# Patient Record
Sex: Female | Born: 1955 | Race: White | Hispanic: No | Smoking: Current every day smoker
Health system: Southern US, Community
[De-identification: ages and names within clinical notes are randomized; demographics above are authoritative.]

## PROBLEM LIST (undated history)

## (undated) DIAGNOSIS — F419 Anxiety disorder, unspecified: Secondary | ICD-10-CM

## (undated) DIAGNOSIS — I509 Heart failure, unspecified: Secondary | ICD-10-CM

## (undated) DIAGNOSIS — M797 Fibromyalgia: Secondary | ICD-10-CM

## (undated) DIAGNOSIS — F32A Depression, unspecified: Secondary | ICD-10-CM

## (undated) DIAGNOSIS — I10 Essential (primary) hypertension: Secondary | ICD-10-CM

## (undated) DIAGNOSIS — N189 Chronic kidney disease, unspecified: Secondary | ICD-10-CM

## (undated) HISTORY — DX: Heart failure, unspecified: I50.9

## (undated) HISTORY — DX: Depression, unspecified: F32.A

## (undated) HISTORY — DX: Anxiety disorder, unspecified: F41.9

## (undated) HISTORY — PX: CERVICAL FUSION: SHX112

---

## 2004-10-12 ENCOUNTER — Encounter: Admission: RE | Admit: 2004-10-12 | Discharge: 2004-10-12 | Payer: Self-pay | Admitting: Gastroenterology

## 2005-06-21 ENCOUNTER — Emergency Department (HOSPITAL_COMMUNITY): Admission: EM | Admit: 2005-06-21 | Discharge: 2005-06-21 | Payer: Self-pay | Admitting: Emergency Medicine

## 2005-06-30 ENCOUNTER — Encounter: Admission: RE | Admit: 2005-06-30 | Discharge: 2005-06-30 | Payer: Self-pay | Admitting: Internal Medicine

## 2005-07-30 ENCOUNTER — Ambulatory Visit (HOSPITAL_COMMUNITY): Admission: RE | Admit: 2005-07-30 | Discharge: 2005-07-31 | Payer: Self-pay | Admitting: Neurosurgery

## 2005-08-24 ENCOUNTER — Encounter: Admission: RE | Admit: 2005-08-24 | Discharge: 2005-08-24 | Payer: Self-pay | Admitting: Neurosurgery

## 2005-08-25 ENCOUNTER — Encounter: Admission: RE | Admit: 2005-08-25 | Discharge: 2005-08-25 | Payer: Self-pay | Admitting: Neurosurgery

## 2005-09-06 ENCOUNTER — Encounter: Admission: RE | Admit: 2005-09-06 | Discharge: 2005-09-06 | Payer: Self-pay | Admitting: Neurosurgery

## 2005-09-20 ENCOUNTER — Ambulatory Visit (HOSPITAL_COMMUNITY): Admission: RE | Admit: 2005-09-20 | Discharge: 2005-09-21 | Payer: Self-pay | Admitting: Neurosurgery

## 2005-10-09 ENCOUNTER — Emergency Department (HOSPITAL_COMMUNITY): Admission: EM | Admit: 2005-10-09 | Discharge: 2005-10-09 | Payer: Self-pay | Admitting: Emergency Medicine

## 2005-11-09 ENCOUNTER — Encounter: Admission: RE | Admit: 2005-11-09 | Discharge: 2005-11-09 | Payer: Self-pay | Admitting: Neurosurgery

## 2006-03-08 ENCOUNTER — Encounter: Admission: RE | Admit: 2006-03-08 | Discharge: 2006-03-08 | Payer: Self-pay | Admitting: Neurosurgery

## 2006-05-31 ENCOUNTER — Encounter: Admission: RE | Admit: 2006-05-31 | Discharge: 2006-05-31 | Payer: Self-pay | Admitting: Neurosurgery

## 2006-06-24 ENCOUNTER — Emergency Department (HOSPITAL_COMMUNITY): Admission: EM | Admit: 2006-06-24 | Discharge: 2006-06-24 | Payer: Self-pay | Admitting: Emergency Medicine

## 2006-08-30 ENCOUNTER — Encounter: Admission: RE | Admit: 2006-08-30 | Discharge: 2006-08-30 | Payer: Self-pay | Admitting: Neurosurgery

## 2006-09-09 ENCOUNTER — Ambulatory Visit (HOSPITAL_COMMUNITY): Admission: RE | Admit: 2006-09-09 | Discharge: 2006-09-09 | Payer: Self-pay | Admitting: Neurosurgery

## 2010-02-15 ENCOUNTER — Emergency Department: Payer: Self-pay | Admitting: Internal Medicine

## 2019-04-23 ENCOUNTER — Emergency Department (HOSPITAL_COMMUNITY)
Admission: EM | Admit: 2019-04-23 | Discharge: 2019-04-23 | Disposition: A | Discharge status: Home / Self Care | Payer: Medicare Other | Attending: Emergency Medicine | Admitting: Emergency Medicine

## 2019-04-23 ENCOUNTER — Other Ambulatory Visit: Payer: Self-pay

## 2019-04-23 ENCOUNTER — Emergency Department (HOSPITAL_COMMUNITY): Payer: Medicare Other

## 2019-04-23 ENCOUNTER — Encounter (HOSPITAL_COMMUNITY): Payer: Self-pay | Admitting: Emergency Medicine

## 2019-04-23 DIAGNOSIS — Y999 Unspecified external cause status: Secondary | ICD-10-CM | POA: Insufficient documentation

## 2019-04-23 DIAGNOSIS — F22 Delusional disorders: Secondary | ICD-10-CM | POA: Insufficient documentation

## 2019-04-23 DIAGNOSIS — X58XXXA Exposure to other specified factors, initial encounter: Secondary | ICD-10-CM | POA: Diagnosis not present

## 2019-04-23 DIAGNOSIS — T171XXA Foreign body in nostril, initial encounter: Secondary | ICD-10-CM | POA: Diagnosis present

## 2019-04-23 DIAGNOSIS — Y9389 Activity, other specified: Secondary | ICD-10-CM | POA: Insufficient documentation

## 2019-04-23 DIAGNOSIS — J3489 Other specified disorders of nose and nasal sinuses: Secondary | ICD-10-CM | POA: Insufficient documentation

## 2019-04-23 DIAGNOSIS — Y92009 Unspecified place in unspecified non-institutional (private) residence as the place of occurrence of the external cause: Secondary | ICD-10-CM | POA: Diagnosis not present

## 2019-04-23 DIAGNOSIS — I10 Essential (primary) hypertension: Secondary | ICD-10-CM | POA: Diagnosis not present

## 2019-04-23 HISTORY — DX: Fibromyalgia: M79.7

## 2019-04-23 HISTORY — DX: Essential (primary) hypertension: I10

## 2019-04-23 NOTE — ED Notes (Signed)
Pt got upset because she states there is string in her nose and she will go to [facility] and if there is string in her nose she is suing this hospital.

## 2019-04-23 NOTE — ED Provider Notes (Signed)
[facility]-EMERGENCY DEPT Provider Note   CSN: [id number] Arrival date & time: 04/23/19  1716    History   Chief Complaint Chief Complaint  Patient presents with  . wire in nose    HPI Emma Gould is a 63 y.o. female.     HPI Patient reports that 2 days ago she was using a Q-tip to put some Vaseline on her nose because the inside of her nose felt raw and sore.  She reports that she dropped a Q-tip on the floor and did not look at it before putting it back into her nose and inadvertently stuck a piece of wire into her nose.  She reports that now it has been stuck in there for 2 days.  She reports she perceives it to be coiled up and to also have passed over to the other side as well.  She reports that she has been pulling it out but pieces of it continue to be stuck in her nose.  She reports that it has tissue or drainage that has "film over it" that I will need to pull away in order to see it.  Patient reports she is otherwise felt very well.  She denies she has been having any fevers or chills.  Denies sinus congestion or nasal drainage or sore throat.  She denies confusion or visual problems.  Patient ports she lives alone.  Her husband died approximately a year ago.  She denies that she has been experiencing any significant anxiety or stress from recent social quarantining isolation.  She reports she has 2 adult children who live in the area. Past Medical History:  Diagnosis Date  . Fibromyalgia   . Hypertension     There are no active problems to display for this patient.   History reviewed. No pertinent surgical history.   OB History   No obstetric history on file.      Home Medications    Prior to Admission medications   Not on File    Family History No family history on file.  Social History Social History   Tobacco Use  . Smoking status: Not on file  Substance Use Topics  . Alcohol use: Not on file  . Drug use: Not on file      Allergies   Patient has no allergy information on record.   Review of Systems Review of Systems 10 Systems reviewed and are negative for acute change except as noted in the HPI.   Physical Exam Updated Vital Signs BP (!) 180/82   Pulse 68   Temp 98 F (36.7 C) (Oral)   Resp 16   SpO2 95%   Physical Exam Constitutional:      Comments: Patient is alert and has a clinically well appearance.  She is physically well-nourished and well-developed.  Patient is well-groomed.  No respiratory distress.  She is interactive with good eye contact.  HENT:     Head: Normocephalic and atraumatic.     Comments: Right ear canal has greater 75% cerumen impaction.  A sliver of the TM is visible and normal in appearance.  Left ear canal is clear with normal TM.    Nose:     Comments: External visual appearance of the nose is normal without any undue swelling or deformity.  Extensive internal visual inspection shows  no visible foreign bodies.  There is no appearance of acute trauma.  The inner aspect of the alae on the right does appear somewhat thickened  and lichenified as if frequent rubbing or picking.  However, there is no moist wound or ulceration.  No blood clots in the nose.  grossly speaking, passages are clear without any undue bogginess or fullness of the turbinates.    Mouth/Throat:     Comments: Patient has dentures.  Airway is widely patent.  Tongue has some diffuse glossitis. Eyes:     Extraocular Movements: Extraocular movements intact.     Conjunctiva/sclera: Conjunctivae normal.     Pupils: Pupils are equal, round, and reactive to light.  Neck:     Musculoskeletal: Neck supple.     Comments: Neck is supple without lymphadenopathy or tenderness. Pulmonary:     Breath sounds: Normal breath sounds.  Abdominal:     Palpations: Abdomen is soft.  Musculoskeletal: Normal range of motion.        General: No swelling or tenderness.  Skin:    General: Skin is warm and dry.   Neurological:     General: No focal deficit present.     Mental Status: She is oriented to person, place, and time.     Coordination: Coordination normal.  Psychiatric:     Comments: Mood is pleasant and interactive.  Patient does not appear to be reacting to internal stimuli.  Speech is not pressured or tangential.      ED Treatments / Results  Labs (all labs ordered are listed, but only abnormal results are displayed) Labs Reviewed - No data to display  EKG None  Radiology Dg Nasal Bones  Result Date: 04/23/2019 CLINICAL DATA:  Metallic nasal foreign body. EXAM: NASAL BONES - 3+ VIEW COMPARISON:  None. FINDINGS: Nasal bones are intact. No evidence for dense linear radiopaque nasal foreign body to suggest metallic etiology. IMPRESSION: No linear radiopaque nasal foreign body to suggest wire. Electronically Signed   By: Kennith CenterEric  Mansell M.D.   On: 04/23/2019 18:37    Procedures Procedures (including critical care time)  Medications Ordered in ED Medications - No data to display   Initial Impression / Assessment and Plan / ED Course  I have reviewed the triage vital signs and the nursing notes.  Pertinent labs & imaging results that were available during my care of the patient were reviewed by me and considered in my medical decision making (see chart for details).       Patient is convinced that she has metallic bodies in her nose.  She described this in various terms anywhere from needles sticking out of her nose, to small snaps with hooks, or some type of coated wire.  In every aspect except this fixed delusion regarding metallic bodies in the nose, patient seems well and functioning normally.  There does not appear to be a medication induced psychosis.  Patient denies that she ever started the nicotine treatments that were prescribed at her most recent tele-visit.  She reports she never actually went to the pharmacy to pick them up.  She reports she is still smoking.  She is  denying any drugs of abuse or other new medications.  Physical exam is otherwise normal.  She has mid range normally responsive pupils, no tremor or appearance of neurologic dysfunction.  Vital signs are stable.  Patient is well oriented immediately able to know the date and her address.  She however refused to call family members.  She had advised me she had 2 grown children, she quickly became very hostile at the idea of speaking to them about this and took significant exception to  the notion that this was a psychiatric issue.  She reports that she refused to call her children because it was "dinnertime" and they were probably eating.  Patient however is not showing any other signs of medical illness.  She not showing any signs of decompensation in her ability to care for herself.  She is well-groomed and medially oriented to place person and time.  She does not exhibit any other active delusions.  I did tactfully and gently try offering exploring this from a psychiatric perspective, this made the patient quite hostile and she immediately refused this stating that she would go to another hospital and if they found something in her nose she would sue me.  She is able to fairly easily redirect and de-escalate.  At this time, there is no grounds upon which to pursue involuntary commitment.  Although, the patient would not contact anybody on her cell phone, I did call the one listed contact in her demographic requesting a call back but not identifying the nature of the patient's visit, thus far there has been no response.  Final Clinical Impressions(s) / ED Diagnoses   Final diagnoses:  Nose pain  Delusion [facility])    ED Discharge Orders    None       Arby Barrette, MD 04/23/19 2003

## 2019-04-23 NOTE — Discharge Instructions (Addendum)
1.  There is irritation of the skin inside your nose. Try to avoid any picking or causing trauma to the inside of the nose by trying to remove something. 2.  There are no wires or other metal things in your nose at this time. It may feel like there is due to skin problems. This may need further evaluation by a specialist if it does not resolve in the next few weeks. 3. It is very important that you follow up with your doctor, even with a televisit, as soon as possible. 4. Return immediately to the ER if you feel you are worsening.

## 2019-04-23 NOTE — ED Triage Notes (Signed)
Pt reports that 2 fridays ago she had sore in her nose and was taking Vaseline on q-tip and rubbing inside of nose with it. Reports that she dropped it on the ground and didn't look at it before she put it back in her nose. Reports that she got wire in her nose "and moved to both sides and is very painful and now has a film over it".

## 2019-04-27 ENCOUNTER — Encounter (HOSPITAL_COMMUNITY): Payer: Self-pay | Admitting: Emergency Medicine

## 2019-04-27 ENCOUNTER — Other Ambulatory Visit: Payer: Self-pay

## 2019-04-27 DIAGNOSIS — X58XXXA Exposure to other specified factors, initial encounter: Secondary | ICD-10-CM | POA: Diagnosis not present

## 2019-04-27 DIAGNOSIS — I10 Essential (primary) hypertension: Secondary | ICD-10-CM | POA: Insufficient documentation

## 2019-04-27 DIAGNOSIS — F450 Somatization disorder: Secondary | ICD-10-CM | POA: Insufficient documentation

## 2019-04-27 DIAGNOSIS — Y929 Unspecified place or not applicable: Secondary | ICD-10-CM | POA: Insufficient documentation

## 2019-04-27 DIAGNOSIS — Y999 Unspecified external cause status: Secondary | ICD-10-CM | POA: Insufficient documentation

## 2019-04-27 DIAGNOSIS — Y939 Activity, unspecified: Secondary | ICD-10-CM | POA: Insufficient documentation

## 2019-04-27 DIAGNOSIS — F17210 Nicotine dependence, cigarettes, uncomplicated: Secondary | ICD-10-CM | POA: Insufficient documentation

## 2019-04-27 DIAGNOSIS — T170XXA Foreign body in nasal sinus, initial encounter: Secondary | ICD-10-CM | POA: Diagnosis present

## 2019-04-27 NOTE — ED Triage Notes (Signed)
Pt states she has a foreign body in her nose  A string that moves and she can feel them . Pt seen several days ago for same x-ray was done which was negative for FB

## 2019-04-28 ENCOUNTER — Emergency Department (HOSPITAL_COMMUNITY)
Admission: EM | Admit: 2019-04-28 | Discharge: 2019-04-28 | Disposition: A | Discharge status: Home / Self Care | Payer: Medicare Other | Attending: Emergency Medicine | Admitting: Emergency Medicine

## 2019-04-28 DIAGNOSIS — F450 Somatization disorder: Secondary | ICD-10-CM

## 2019-04-28 NOTE — ED Provider Notes (Signed)
[facility]-EMERGENCY DEPT Provider Note   CSN: [id number] Arrival date & time: 04/27/19  2331    History   Chief Complaint Chief Complaint  Patient presents with  . Medical Clearance  . Foreign Body in Nose    HPI Emma Gould is a 63 y.o. female.   The history is provided by the patient.  She has history of hypertension and fibromyalgia and comes in complaining of string and wire that is caught in her nose for the last 2 weeks.  Symptoms started when she was using a Q-tip to apply medication to a sore in her nose and states that she dropped a Q-tip and when she put it back in her nose that some wire got caught in her nose.  Since then, she has a sensation of a wire in her nose and string in her nose.  She states that she is able to pull the string out, but there is always more.  She also has a sense of a needle being in her nose.  She was seen in her physician's office and in the emergency department and was frustrated that neither physician saw anything in her nose.  She had been encouraged to get psychiatric treatment and adamantly refused.  Past Medical History:  Diagnosis Date  . Fibromyalgia   . Hypertension     There are no active problems to display for this patient.   History reviewed. No pertinent surgical history.   OB History   No obstetric history on file.      Home Medications    Prior to Admission medications   Not on File    Family History History reviewed. No pertinent family history.  Social History Social History   Tobacco Use  . Smoking status: Current Every Day Smoker  . Smokeless tobacco: Never Used  Substance Use Topics  . Alcohol use: Never    Frequency: Never  . Drug use: Never     Allergies   Patient has no known allergies.   Review of Systems Review of Systems  All other systems reviewed and are negative.    Physical Exam Updated Vital Signs BP 138/65 (BP Location: Right Arm)   Pulse 63   Temp  97.9 F (36.6 C) (Oral)   Resp 20   SpO2 100%   Physical Exam Vitals signs and nursing note reviewed.    63 year old female, resting comfortably and in no acute distress. Vital signs are normal. Oxygen saturation is 100%, which is normal. Head is normocephalic and atraumatic. PERRLA, EOMI. Oropharynx is clear. Neck is nontender and supple without adenopathy or JVD.  Examination of the nose is completely normal.  The nares are patent, septum is intact, turbinates appear normal.  No evidence of any foreign material present. Back is nontender and there is no CVA tenderness. Lungs are clear without rales, wheezes, or rhonchi. Chest is nontender. Heart has regular rate and rhythm without murmur. Abdomen is soft, flat, nontender without masses or hepatosplenomegaly and peristalsis is normoactive. Extremities have no cyanosis or edema, full range of motion is present. Skin is warm and dry without rash. Neurologic: Mental status is normal, cranial nerves are intact, there are no motor or sensory deficits.  ED Treatments / Results   Procedures Procedures  Medications Ordered in ED Medications - No data to display   Initial Impression / Assessment and Plan / ED Course  I have reviewed the triage vital signs and the nursing notes.  Somatizations disorder.  I have explained this to the patient who became quite indignant and said that she absolutely will not discuss this with mental health personnel and wanted an ear nose throat physician to come in to see her.  She is advised that that will not happen but she is referred to an ENT physician for outpatient evaluation.  Patient also became very indignant that I could not see string that she swore was there and could not see needle and wire that she swore was there.  I did carefully evaluate the parts of her nose that she was pointing to and they were completely normal.  She is given outpatient psychiatric resources.  Old records are reviewed  confirming an office visit on an ED visit for the same complaint and with a similar patient response when somatizations disorder was suggested.  Final Clinical Impressions(s) / ED Diagnoses   Final diagnoses:  Somatization disorder    ED Discharge Orders    None       Dione Booze, MD 04/28/19 [phone]

## 2019-04-28 NOTE — Discharge Instructions (Addendum)
Evaluation by myself, and 2 other physicians failed to show any evidence of anything in your nose.  X-rays showed no evidence of anything in your nose.  Sometimes there is a problem where you perceived something there that is not.  I feel that she should work with a Financial trader to discuss this.  However, you are welcome to follow-up with the ear nose throat physician who can do a more complete evaluation of the deeper parts of your nose.  This is unlikely to show anything different than what was seen by myself or the 2 other physicians who have evaluated your nose.

## 2019-08-31 ENCOUNTER — Inpatient Hospital Stay (HOSPITAL_COMMUNITY)
Admission: EM | Admit: 2019-08-31 | Discharge: 2019-09-05 | DRG: 177 | Disposition: A | Discharge status: Home Health | Payer: Medicare Other | Attending: Internal Medicine | Admitting: Internal Medicine

## 2019-08-31 ENCOUNTER — Emergency Department (HOSPITAL_COMMUNITY): Payer: Medicare Other

## 2019-08-31 ENCOUNTER — Other Ambulatory Visit: Payer: Self-pay

## 2019-08-31 ENCOUNTER — Encounter (HOSPITAL_COMMUNITY): Payer: Self-pay

## 2019-08-31 DIAGNOSIS — Z8249 Family history of ischemic heart disease and other diseases of the circulatory system: Secondary | ICD-10-CM

## 2019-08-31 DIAGNOSIS — F17210 Nicotine dependence, cigarettes, uncomplicated: Secondary | ICD-10-CM | POA: Diagnosis present

## 2019-08-31 DIAGNOSIS — I10 Essential (primary) hypertension: Secondary | ICD-10-CM | POA: Diagnosis present

## 2019-08-31 DIAGNOSIS — R0902 Hypoxemia: Secondary | ICD-10-CM

## 2019-08-31 DIAGNOSIS — E876 Hypokalemia: Secondary | ICD-10-CM | POA: Diagnosis not present

## 2019-08-31 DIAGNOSIS — Z801 Family history of malignant neoplasm of trachea, bronchus and lung: Secondary | ICD-10-CM

## 2019-08-31 DIAGNOSIS — F411 Generalized anxiety disorder: Secondary | ICD-10-CM | POA: Diagnosis present

## 2019-08-31 DIAGNOSIS — J069 Acute upper respiratory infection, unspecified: Secondary | ICD-10-CM | POA: Diagnosis present

## 2019-08-31 DIAGNOSIS — K529 Noninfective gastroenteritis and colitis, unspecified: Secondary | ICD-10-CM | POA: Diagnosis present

## 2019-08-31 DIAGNOSIS — U071 COVID-19: Secondary | ICD-10-CM | POA: Diagnosis not present

## 2019-08-31 DIAGNOSIS — R197 Diarrhea, unspecified: Secondary | ICD-10-CM | POA: Diagnosis present

## 2019-08-31 DIAGNOSIS — G43909 Migraine, unspecified, not intractable, without status migrainosus: Secondary | ICD-10-CM | POA: Diagnosis present

## 2019-08-31 DIAGNOSIS — J9601 Acute respiratory failure with hypoxia: Secondary | ICD-10-CM | POA: Diagnosis present

## 2019-08-31 DIAGNOSIS — Z720 Tobacco use: Secondary | ICD-10-CM | POA: Diagnosis present

## 2019-08-31 DIAGNOSIS — R0602 Shortness of breath: Secondary | ICD-10-CM

## 2019-08-31 DIAGNOSIS — Z716 Tobacco abuse counseling: Secondary | ICD-10-CM

## 2019-08-31 DIAGNOSIS — E8809 Other disorders of plasma-protein metabolism, not elsewhere classified: Secondary | ICD-10-CM

## 2019-08-31 DIAGNOSIS — Z23 Encounter for immunization: Secondary | ICD-10-CM

## 2019-08-31 DIAGNOSIS — F329 Major depressive disorder, single episode, unspecified: Secondary | ICD-10-CM | POA: Diagnosis present

## 2019-08-31 DIAGNOSIS — Z79899 Other long term (current) drug therapy: Secondary | ICD-10-CM

## 2019-08-31 DIAGNOSIS — K219 Gastro-esophageal reflux disease without esophagitis: Secondary | ICD-10-CM | POA: Diagnosis present

## 2019-08-31 DIAGNOSIS — Z981 Arthrodesis status: Secondary | ICD-10-CM

## 2019-08-31 DIAGNOSIS — F32A Depression, unspecified: Secondary | ICD-10-CM | POA: Diagnosis present

## 2019-08-31 DIAGNOSIS — J1289 Other viral pneumonia: Secondary | ICD-10-CM | POA: Diagnosis present

## 2019-08-31 DIAGNOSIS — R109 Unspecified abdominal pain: Secondary | ICD-10-CM | POA: Diagnosis present

## 2019-08-31 DIAGNOSIS — M797 Fibromyalgia: Secondary | ICD-10-CM | POA: Diagnosis present

## 2019-08-31 LAB — COMPREHENSIVE METABOLIC PANEL
ALT: 21 U/L (ref 0–44)
AST: 33 U/L (ref 15–41)
Albumin: 2.9 g/dL — ABNORMAL LOW (ref 3.5–5.0)
Alkaline Phosphatase: 101 U/L (ref 38–126)
Anion gap: 14 (ref 5–15)
BUN: 13 mg/dL (ref 8–23)
CO2: 25 mmol/L (ref 22–32)
Calcium: 8.9 mg/dL (ref 8.9–10.3)
Chloride: 100 mmol/L (ref 98–111)
Creatinine, Ser: 0.89 mg/dL (ref 0.44–1.00)
GFR calc Af Amer: >60 mL/min (ref >60)
GFR calc non Af Amer: >60 mL/min (ref >60)
Glucose, Bld: 107 mg/dL — ABNORMAL HIGH (ref 70–99)
Potassium: 2.3 mmol/L — CL (ref 3.5–5.1)
Sodium: 139 mmol/L (ref 135–145)
Total Bilirubin: 0.7 mg/dL (ref 0.3–1.2)
Total Protein: 7.8 g/dL (ref 6.5–8.1)

## 2019-08-31 LAB — CBC WITH DIFFERENTIAL/PLATELET
Abs Immature Granulocytes: 0 10*3/uL (ref 0.00–0.07)
Basophils Absolute: 0 10*3/uL (ref 0.0–0.1)
Basophils Relative: 0 %
Eosinophils Absolute: 0 10*3/uL (ref 0.0–0.5)
Eosinophils Relative: 0 %
HCT: 42.3 % (ref 36.0–46.0)
Hemoglobin: 14.3 g/dL (ref 12.0–15.0)
Lymphocytes Relative: 13 %
Lymphs Abs: 1.4 10*3/uL (ref 0.7–4.0)
MCH: 28.8 pg (ref 26.0–34.0)
MCHC: 33.8 g/dL (ref 30.0–36.0)
MCV: 85.1 fL (ref 80.0–100.0)
Monocytes Absolute: 0.5 10*3/uL (ref 0.1–1.0)
Monocytes Relative: 5 %
Neutro Abs: 8.9 10*3/uL — ABNORMAL HIGH (ref 1.7–7.7)
Neutrophils Relative %: 82 %
Platelets: 505 10*3/uL — ABNORMAL HIGH (ref 150–400)
RBC: 4.97 MIL/uL (ref 3.87–5.11)
RDW: 14.6 % (ref 11.5–15.5)
WBC: 10.9 10*3/uL — ABNORMAL HIGH (ref 4.0–10.5)
nRBC: 0 /100 WBC
nRBC: 0.5 % — ABNORMAL HIGH (ref 0.0–0.2)

## 2019-08-31 LAB — LIPASE, BLOOD: Lipase: 34 U/L (ref 11–51)

## 2019-08-31 LAB — SARS CORONAVIRUS 2 BY RT PCR (HOSPITAL ORDER, PERFORMED IN ~~LOC~~ HOSPITAL LAB): SARS Coronavirus 2: POSITIVE — AB

## 2019-08-31 MED ORDER — ONDANSETRON HCL 4 MG/2ML IJ SOLN
4.0000 mg | Freq: Once | INTRAMUSCULAR | Status: AC
  Administered 2019-08-31: 4 mg via INTRAVENOUS
  Filled 2019-08-31: qty 2

## 2019-08-31 MED ORDER — SODIUM CHLORIDE 0.9 % IV BOLUS: 1000.0000 mL | Freq: Once | INTRAVENOUS | Status: DC

## 2019-08-31 MED ORDER — POTASSIUM CHLORIDE CRYS ER 20 MEQ PO TBCR
40.0000 meq | EXTENDED_RELEASE_TABLET | Freq: Once | ORAL | Status: AC
  Administered 2019-08-31: 40 meq via ORAL
  Filled 2019-08-31: qty 2

## 2019-08-31 MED ORDER — MORPHINE SULFATE (PF) 2 MG/ML IV SOLN
2.0000 mg | Freq: Once | INTRAVENOUS | Status: AC
  Administered 2019-08-31: 2 mg via INTRAVENOUS
  Filled 2019-08-31: qty 1

## 2019-08-31 MED ORDER — POTASSIUM CHLORIDE 10 MEQ/100ML IV SOLN
10.0000 meq | INTRAVENOUS | Status: AC
  Administered 2019-08-31 – 2019-09-01 (×4): 10 meq via INTRAVENOUS
  Filled 2019-08-31 (×4): qty 100

## 2019-08-31 NOTE — ED Triage Notes (Signed)
Pt arrived via [facility]; pt with multiple complaints; c/o abd pin; also HA x 4 days; EMS reports HTN; pt sating at 88% on RA upon EMS arrival and placed on 4 L via [place]; pt does not wear O2 at hm; 167/104; 20; 93; 95% on 4 L; pt rec'd 4mg  of Zofran; EMS reports that pt had multiple meds on kitchen table and unable to state what was taken, new, or what was not taken

## 2019-08-31 NOTE — ED Notes (Signed)
RN collecting labs 

## 2019-08-31 NOTE — ED Provider Notes (Signed)
[name]   CSN: [id number] Arrival date & time: 08/31/19  2130     History   Chief Complaint Chief Complaint  Patient presents with  . Abdominal Pain    HA x 4 days; HTN    HPI Emma Gould is a 63 y.o. female with past medical history of hypertension and for myalgia who presents to the ED via EMS with complaints of headache, nausea, diarrhea, shortness of breath, and 8/10 abdominal pain x4 days.  Patient also complains of productive cough with Emma Gould sputum that began approximately 6 days ago.  Patient reports 5 watery stools today and diminished appetite.  She reports fever for the past couple of days, with a reported high of 101 degrees.  EMS reports that her O2 saturation was 88% and she does not usually require supplemental O2.  She has a 40-pack-year smoking history, but denies hx of COPD or asthma. She denies any alcohol or illicit drug use.  She reports having had bilateral lower leg pain, discoloration, and swelling. She states that the swelling and discoloration has recently improved, but the pain remains. When asked she is unsure if this could simply be her fibromyalgia.  She denies any chest pain, urinary symptoms, vomiting, back pain, ear pain, or sore throat.  Patient lives alone and has no obvious sick contacts.  HPI  Past Medical History:  Diagnosis Date  . Fibromyalgia   . Hypertension     Patient Active Problem List   Diagnosis Date Noted  . Hypertension   . GERD (gastroesophageal reflux disease)   . Depression   . Migraine   . Tobacco abuse   . Hypokalemia   . Acute respiratory disease due to COVID-19 virus     History reviewed. No pertinent surgical history.   OB History   No obstetric history on file.      Home Medications    Prior to Admission medications   Medication Sig Start Date End Date Taking? Authorizing Provider  acetaminophen (TYLENOL) 500 MG tablet Take 1,500 mg by mouth every 6  (six) hours as needed for moderate pain.   Yes [provider]  albuterol (VENTOLIN HFA) 108 (90 Base) MCG/ACT inhaler Inhale 2 puffs into the lungs 4 (four) times daily as needed for shortness of breath.   Yes [provider]  cloNIDine (CATAPRES) 0.1 MG tablet Take 0.1 mg by mouth 2 (two) times daily. 06/11/19  Yes [provider]  escitalopram (LEXAPRO) 20 MG tablet Take 20 mg by mouth daily. 08/07/19  Yes [provider]  furosemide (LASIX) 20 MG tablet Take 20 mg by mouth daily.  06/11/19  Yes [provider]  gabapentin (NEURONTIN) 400 MG capsule Take 1,200 mg by mouth 3 (three) times daily. 08/08/19  Yes [provider]  hydrOXYzine (ATARAX/VISTARIL) 50 MG tablet Take 100 mg by mouth 3 (three) times daily. 04/25/19  Yes [provider]  omeprazole (PRILOSEC) 40 MG capsule Take 40 mg by mouth daily. 06/11/19  Yes [provider]  propranolol ER (INDERAL LA) 80 MG 24 hr capsule Take 80 mg by mouth daily. 06/11/19  Yes [provider]  SUMAtriptan (IMITREX) 50 MG tablet Take 50 mg by mouth as needed for migraine. Take a second tablet 2 hours later if needed; max 200mg /day 12/21/18  Yes [provider]  tiZANidine (ZANAFLEX) 4 MG tablet Take 4 mg by mouth every 6 (six) hours as needed. 08/08/19 09/07/19 Yes [provider]  triamcinolone cream (KENALOG) 0.1 % Apply 1 application topically See admin instructions. 2 to 3 times a day 08/06/19  Yes [provider]    Family History History reviewed. No pertinent family history.  Social History Social History   Tobacco Use  . Smoking status: Current Every Day Smoker  . Smokeless tobacco: Never Used  Substance Use Topics  . Alcohol use: Never    Frequency: Never  . Drug use: Never     Allergies   Patient has no known allergies.   Review of Systems Review of Systems Ten systems are reviewed and are negative for acute change except as  noted in the HPI  Physical Exam Updated Vital Signs BP (!) 172/85 (BP Location: Right Arm)   Pulse 84   Temp 99.5 F (37.5 C) (Rectal)   Resp (!) 21   Ht 5\' 3"  (1.6 m)   Wt 68 kg   SpO2 96%   BMI 26.57 kg/m   Physical Exam Constitutional:      Appearance: Normal appearance.  HENT:     Head: Normocephalic and atraumatic.  Eyes:     General: No scleral icterus.    Conjunctiva/sclera: Conjunctivae normal.  Cardiovascular:     Rate and Rhythm: Normal rate and regular rhythm.     Pulses: Normal pulses.  Pulmonary:     Breath sounds: Normal breath sounds.     Comments: Tachypneic with mildly increased work of breathing. Abdominal:     General: Abdomen is flat. There is no distension.     Palpations: Abdomen is soft.     Comments: Tenderness to palpation diffusely, but most notably in the epigastric area.  No guarding.  Negative McBurney's point tenderness or Rovsing sign.  Musculoskeletal:     Comments: Lower legs tender to palpation bilaterally.  No obvious swelling or pitting edema. ROM, strength, and sensation intact.  Skin:    Comments: No rashes or erythema.  Neurological:     Mental Status: She is alert and oriented to person, place, and time.     GCS: GCS eye subscore is 4. GCS verbal subscore is 5. GCS motor subscore is 6.  Psychiatric:        Mood and Affect: Mood is anxious.        Thought Content: Thought content normal.      ED Treatments / Results  Labs (all labs ordered are listed, but only abnormal results are displayed) Labs Reviewed  SARS CORONAVIRUS 2 ([facility]) - Abnormal; Notable for the following components:      Result Value   SARS Coronavirus 2 POSITIVE (*)    All other components within normal limits  COMPREHENSIVE METABOLIC PANEL - Abnormal; Notable for the following components:   Potassium 2.3 (*)    Glucose, Bld 107 (*)    Albumin 2.9 (*)    All other components within normal limits  CBC WITH  DIFFERENTIAL/PLATELET - Abnormal; Notable for the following components:   WBC 10.9 (*)    Platelets 505 (*)    nRBC 0.5 (*)    Neutro Abs 8.9 (*)    All other components within normal limits  D-DIMER, QUANTITATIVE (NOT AT [facility]) - Abnormal; Notable for the following components:   D-Dimer, Quant 1.50 (*)    All other components within normal limits  LACTATE DEHYDROGENASE - Abnormal; Notable for the following components:   LDH 385 (*)    All other components within normal limits  FIBRINOGEN -  Abnormal; Notable for the following components:   Fibrinogen 635 (*)    All other components within normal limits  CULTURE, BLOOD (ROUTINE X 2)  CULTURE, BLOOD (ROUTINE X 2)  LIPASE, BLOOD  LACTIC ACID, PLASMA  TRIGLYCERIDES  BRAIN NATRIURETIC PEPTIDE  URINALYSIS, ROUTINE W REFLEX MICROSCOPIC  PROCALCITONIN  FERRITIN  C-REACTIVE PROTEIN    EKG EKG Interpretation  Date/Time:  Friday August 31 2019 22:13:04 EDT Ventricular Rate:  88 PR Interval:    QRS Duration: 99 QT Interval:  405 QTC Calculation: 490 R Axis:   78 Text Interpretation:  Sinus rhythm Short PR interval Borderline repolarization abnormality Borderline prolonged QT interval When compared with ECG of 02/15/2010, No significant change was found Confirmed by Dione Booze ([id number]) on 08/31/2019 11:25:27 PM   Radiology Ct Angio Chest Pe W And/or Wo Contrast  Result Date: 09/01/2019 CLINICAL DATA:  Abdominal pain, O2 saturation 88% on room air EXAM: CT ANGIOGRAPHY CHEST WITH CONTRAST TECHNIQUE: Multidetector CT imaging of the chest was performed using the standard protocol during bolus administration of intravenous contrast. Multiplanar CT image reconstructions and MIPs were obtained to evaluate the vascular anatomy. CONTRAST:  OMNIPAQUE IOHEXOL 350 MG/ML SOLN COMPARISON:  None. FINDINGS: Cardiovascular: There is a optimal opacification of the pulmonary arteries. There is no central,segmental, or subsegmental filling defects  within the pulmonary arteries. The heart is normal in size. No pericardial effusion thickening. No evidence right heart strain. There is normal three-vessel brachiocephalic anatomy without proximal stenosis. The thoracic aorta is normal in appearance. Mediastinum/Nodes: No hilar, mediastinal, or axillary adenopathy. Thyroid gland, trachea, and esophagus demonstrate no significant findings. Lungs/Pleura: There is dense of patchy airspace opacities seen within the periphery and predominantly within the upper lobes. No pleural effusion is seen. Upper Abdomen: No acute abnormalities present in the visualized portions of the upper abdomen. Musculoskeletal: No chest wall abnormality. No acute or significant osseous findings. Review of the MIP images confirms the above findings. IMPRESSION: Patchy airspace opacities throughout both lungs. There are a spectrum of findings in the lungs which can be seen with acute atypical infection (as well as other non-infectious etiologies). In particular, viral pneumonia (including COVID-19) should be considered in the appropriate clinical setting. No central, segmental, or subsegmental pulmonary embolism. Electronically Signed   By: Jonna Clark M.D.   On: 09/01/2019 00:59   Dg Chest Portable 1 View  Result Date: 08/31/2019 CLINICAL DATA:  Shortness of breath EXAM: PORTABLE CHEST 1 VIEW COMPARISON:  01/07/2018 FINDINGS: Heart is upper limits normal in size. Patchy bilateral airspace opacities are noted concerning for multifocal pneumonia. No effusion or pneumothorax. No acute bony abnormality. IMPRESSION: Patchy bilateral airspace opacities concerning for pneumonia. Electronically Signed   By: Charlett Nose M.D.   On: 08/31/2019 22:01    Procedures Procedures (including critical care time)  Medications Ordered in ED Medications  potassium chloride 10 mEq in 100 mL IVPB (10 mEq Intravenous New Bag/Given 09/01/19 0114)  oxyCODONE-acetaminophen (PERCOCET/ROXICET) 5-325 MG per  tablet 1 tablet (has no administration in time range)  morphine 2 MG/ML injection 2 mg (has no administration in time range)  ondansetron (ZOFRAN) injection 4 mg (4 mg Intravenous Given 08/31/19 2319)  morphine 2 MG/ML injection 2 mg (2 mg Intravenous Given 08/31/19 2319)  potassium chloride SA (K-DUR) CR tablet 40 mEq (40 mEq Oral Given 08/31/19 2346)  iohexol (OMNIPAQUE) 350 MG/ML injection 100 mL (100 mLs Intravenous Contrast Given 09/01/19 0044)     Initial Impression / Assessment and Plan / ED  Course  I have reviewed the triage vital signs and the nursing notes.  Pertinent labs & imaging results that were available during my care of the patient were reviewed by me and considered in my medical decision making (see chart for details).  Clinical Course as of Sep 01 119  Fri Aug 31, 2019  2301 Patient on 4L O2 via nasal cannula  SpO2: 97 % [GG]  2359 Positive.  I personally evaluated her.  She has mild increased work of breathing, tachypnea and is requiring supplemental oxygen.  Chest x-ray does show COVID pneumonia.  Mild leukocytosis.  Significant hypokalemia.  Repletion begun with oral and IV potassium.  [facility]  Patient will be admitted.  SARS Coronavirus 2(!): POSITIVE [HM]  Sat Sep 01, 2019  0030 Discussed with Dr. Clyde LundborgNiu who will admit   [HM]  0118 Discussed with pt's mother Ms. Craft at the request of the patient.  Questions answered.     [HM]    Clinical Course User Index [GG] Lorelee NewGreen, Salmaan Patchin L, PA-C [HM] Muthersbaugh, Dahlia ClientHannah, PA-C       Patient's history, physical exam, and labs are consistent with acute COVID-19 infection, for which she tested positive.  Findings on CT angiogram were consistent with that diagnosis as well.  Given new onset hypoxia in addition to reported lower leg swelling discomfort, ordered CT angiogram to rule out PE.  CT angiogram was negative for pulmonary embolism.  Given epigastric pain and diarrhea, obtained lipase and liver enzymes to  assess for pancreatitis or gallbladder pathology, which were within normal limits.    While patient's history and chest x-ray are concerning for pneumonia, perhaps legionnaires, but positive COVID-19 test leads me to believe that is the cause of her symptoms.   Do not suspect appendicitis despite elevated white count with left shift given presence of new onset hypoxia, headache, productive cough and due to multiple abdominal exams with no right lower quadrant localization of tenderness.    Patient states that her headache is unlike her previous migraines, but less severe, atraumatic, and not thunderclap presentation so do not suspect intracranial bleed.   Given new onset respiratory difficulty and reported lower leg swelling, ordered BMP to assess for cardiac stretching and congestive heart failure.  Patient was severely hypokalemic to 2.3, possibly as result of her reported diarrhea, which we treated with IV and p.o. potassium.  There were no concerning EKG changes. Patient also appears to be hypoalbuminemic, perhaps as a result of her reported lack of appetite.  Patient was given Zofran and morphine for nausea and pain, both of which are improved. Continuing to monitor for hypoxia and increased work of breathing. [facility]  Patient voiced understanding and is agreeable to plan.   Final Clinical Impressions(s) / ED Diagnoses   Final diagnoses:  COVID-19 virus infection  Hypokalemia  Hypoalbuminemia  Hypoxia    ED Discharge Orders    None       Lorelee NewGreen, Marlaya Turck L, PA-C 09/01/19 0123    Lorre NickAllen, Anthony, MD 09/04/19 [phone]

## 2019-09-01 ENCOUNTER — Emergency Department (HOSPITAL_COMMUNITY): Payer: Medicare Other

## 2019-09-01 ENCOUNTER — Other Ambulatory Visit: Payer: Self-pay

## 2019-09-01 ENCOUNTER — Encounter (HOSPITAL_COMMUNITY): Payer: Self-pay | Admitting: Internal Medicine

## 2019-09-01 DIAGNOSIS — Z716 Tobacco abuse counseling: Secondary | ICD-10-CM | POA: Diagnosis not present

## 2019-09-01 DIAGNOSIS — F32A Depression, unspecified: Secondary | ICD-10-CM | POA: Diagnosis present

## 2019-09-01 DIAGNOSIS — I10 Essential (primary) hypertension: Secondary | ICD-10-CM

## 2019-09-01 DIAGNOSIS — J9601 Acute respiratory failure with hypoxia: Secondary | ICD-10-CM | POA: Diagnosis present

## 2019-09-01 DIAGNOSIS — E8809 Other disorders of plasma-protein metabolism, not elsewhere classified: Secondary | ICD-10-CM | POA: Diagnosis present

## 2019-09-01 DIAGNOSIS — E876 Hypokalemia: Secondary | ICD-10-CM | POA: Diagnosis present

## 2019-09-01 DIAGNOSIS — Z981 Arthrodesis status: Secondary | ICD-10-CM | POA: Diagnosis not present

## 2019-09-01 DIAGNOSIS — K219 Gastro-esophageal reflux disease without esophagitis: Secondary | ICD-10-CM | POA: Diagnosis present

## 2019-09-01 DIAGNOSIS — F329 Major depressive disorder, single episode, unspecified: Secondary | ICD-10-CM | POA: Diagnosis present

## 2019-09-01 DIAGNOSIS — Z8249 Family history of ischemic heart disease and other diseases of the circulatory system: Secondary | ICD-10-CM | POA: Diagnosis not present

## 2019-09-01 DIAGNOSIS — M797 Fibromyalgia: Secondary | ICD-10-CM | POA: Diagnosis present

## 2019-09-01 DIAGNOSIS — J069 Acute upper respiratory infection, unspecified: Secondary | ICD-10-CM | POA: Diagnosis present

## 2019-09-01 DIAGNOSIS — K529 Noninfective gastroenteritis and colitis, unspecified: Secondary | ICD-10-CM | POA: Diagnosis present

## 2019-09-01 DIAGNOSIS — G43909 Migraine, unspecified, not intractable, without status migrainosus: Secondary | ICD-10-CM | POA: Diagnosis present

## 2019-09-01 DIAGNOSIS — F17210 Nicotine dependence, cigarettes, uncomplicated: Secondary | ICD-10-CM | POA: Diagnosis present

## 2019-09-01 DIAGNOSIS — Z801 Family history of malignant neoplasm of trachea, bronchus and lung: Secondary | ICD-10-CM | POA: Diagnosis not present

## 2019-09-01 DIAGNOSIS — Z79899 Other long term (current) drug therapy: Secondary | ICD-10-CM | POA: Diagnosis not present

## 2019-09-01 DIAGNOSIS — U071 COVID-19: Secondary | ICD-10-CM | POA: Diagnosis present

## 2019-09-01 DIAGNOSIS — Z23 Encounter for immunization: Secondary | ICD-10-CM | POA: Diagnosis present

## 2019-09-01 DIAGNOSIS — J1289 Other viral pneumonia: Secondary | ICD-10-CM | POA: Diagnosis present

## 2019-09-01 DIAGNOSIS — Z720 Tobacco use: Secondary | ICD-10-CM | POA: Diagnosis present

## 2019-09-01 DIAGNOSIS — F411 Generalized anxiety disorder: Secondary | ICD-10-CM | POA: Diagnosis present

## 2019-09-01 DIAGNOSIS — R109 Unspecified abdominal pain: Secondary | ICD-10-CM | POA: Diagnosis present

## 2019-09-01 DIAGNOSIS — R197 Diarrhea, unspecified: Secondary | ICD-10-CM | POA: Diagnosis present

## 2019-09-01 LAB — C-REACTIVE PROTEIN
CRP: 6.6 mg/dL — ABNORMAL HIGH (ref <1.0)
CRP: 7.7 mg/dL — ABNORMAL HIGH (ref <1.0)

## 2019-09-01 LAB — PROCALCITONIN: Procalcitonin: <0.1 ng/mL

## 2019-09-01 LAB — BASIC METABOLIC PANEL
Anion gap: 11 (ref 5–15)
BUN: 11 mg/dL (ref 8–23)
CO2: 22 mmol/L (ref 22–32)
Calcium: 8.5 mg/dL — ABNORMAL LOW (ref 8.9–10.3)
Chloride: 107 mmol/L (ref 98–111)
Creatinine, Ser: 0.77 mg/dL (ref 0.44–1.00)
GFR calc Af Amer: >60 mL/min (ref >60)
GFR calc non Af Amer: >60 mL/min (ref >60)
Glucose, Bld: 133 mg/dL — ABNORMAL HIGH (ref 70–99)
Potassium: 4.1 mmol/L (ref 3.5–5.1)
Sodium: 140 mmol/L (ref 135–145)

## 2019-09-01 LAB — COMPREHENSIVE METABOLIC PANEL
ALT: 19 U/L (ref 0–44)
AST: 27 U/L (ref 15–41)
Albumin: 2.6 g/dL — ABNORMAL LOW (ref 3.5–5.0)
Alkaline Phosphatase: 87 U/L (ref 38–126)
Anion gap: 12 (ref 5–15)
BUN: 11 mg/dL (ref 8–23)
CO2: 24 mmol/L (ref 22–32)
Calcium: 9 mg/dL (ref 8.9–10.3)
Chloride: 104 mmol/L (ref 98–111)
Creatinine, Ser: 0.78 mg/dL (ref 0.44–1.00)
GFR calc Af Amer: >60 mL/min (ref >60)
GFR calc non Af Amer: >60 mL/min (ref >60)
Glucose, Bld: 86 mg/dL (ref 70–99)
Potassium: 3.5 mmol/L (ref 3.5–5.1)
Sodium: 140 mmol/L (ref 135–145)
Total Bilirubin: 0.6 mg/dL (ref 0.3–1.2)
Total Protein: 7.1 g/dL (ref 6.5–8.1)

## 2019-09-01 LAB — CBC
HCT: 44.7 % (ref 36.0–46.0)
Hemoglobin: 14.7 g/dL (ref 12.0–15.0)
MCH: 28.5 pg (ref 26.0–34.0)
MCHC: 32.9 g/dL (ref 30.0–36.0)
MCV: 86.6 fL (ref 80.0–100.0)
Platelets: 439 10*3/uL — ABNORMAL HIGH (ref 150–400)
RBC: 5.16 MIL/uL — ABNORMAL HIGH (ref 3.87–5.11)
RDW: 14.9 % (ref 11.5–15.5)
WBC: 9.4 10*3/uL (ref 4.0–10.5)
nRBC: 0.3 % — ABNORMAL HIGH (ref 0.0–0.2)

## 2019-09-01 LAB — BRAIN NATRIURETIC PEPTIDE
B Natriuretic Peptide: 61.2 pg/mL (ref 0.0–100.0)
B Natriuretic Peptide: 60 pg/mL (ref 0.0–100.0)

## 2019-09-01 LAB — MAGNESIUM: Magnesium: 2.6 mg/dL — ABNORMAL HIGH (ref 1.7–2.4)

## 2019-09-01 LAB — LACTIC ACID, PLASMA: Lactic Acid, Venous: 1.6 mmol/L (ref 0.5–1.9)

## 2019-09-01 LAB — FERRITIN
Ferritin: 190 ng/mL (ref 11–307)
Ferritin: 219 ng/mL (ref 11–307)

## 2019-09-01 LAB — TRIGLYCERIDES: Triglycerides: 140 mg/dL (ref <150)

## 2019-09-01 LAB — D-DIMER, QUANTITATIVE: D-Dimer, Quant: 1.5 ug/mL-FEU — ABNORMAL HIGH (ref 0.00–0.50)

## 2019-09-01 LAB — TYPE AND SCREEN
ABO/RH(D): A POS
Antibody Screen: NEGATIVE

## 2019-09-01 LAB — FIBRINOGEN: Fibrinogen: 635 mg/dL — ABNORMAL HIGH (ref 210–475)

## 2019-09-01 LAB — LACTATE DEHYDROGENASE
LDH: 385 U/L — ABNORMAL HIGH (ref 98–192)
LDH: 363 U/L — ABNORMAL HIGH (ref 98–192)

## 2019-09-01 MED ORDER — POTASSIUM CHLORIDE 2 MEQ/ML IV SOLN
INTRAVENOUS | Status: AC
  Administered 2019-09-01: 17:00:00 via INTRAVENOUS
  Filled 2019-09-01 (×2): qty 1000

## 2019-09-01 MED ORDER — ALBUTEROL SULFATE HFA 108 (90 BASE) MCG/ACT IN AERS
2.0000 | INHALATION_SPRAY | Freq: Four times a day (QID) | RESPIRATORY_TRACT | Status: DC | PRN
  Filled 2019-09-01: qty 6.7

## 2019-09-01 MED ORDER — ZINC SULFATE 220 (50 ZN) MG PO CAPS
220.0000 mg | ORAL_CAPSULE | Freq: Every day | ORAL | Status: DC
  Administered 2019-09-01 – 2019-09-05 (×5): 220 mg via ORAL
  Filled 2019-09-01 (×5): qty 1

## 2019-09-01 MED ORDER — ONDANSETRON HCL 4 MG PO TABS: 4.0000 mg | ORAL_TABLET | Freq: Four times a day (QID) | ORAL | Status: DC | PRN

## 2019-09-01 MED ORDER — SODIUM CHLORIDE 0.9 % IV SOLN
200.0000 mg | Freq: Once | INTRAVENOUS | Status: AC
  Administered 2019-09-01: 11:00:00 200 mg via INTRAVENOUS
  Filled 2019-09-01: qty 40

## 2019-09-01 MED ORDER — HYDRALAZINE HCL 20 MG/ML IJ SOLN: 10.0000 mg | Freq: Four times a day (QID) | INTRAMUSCULAR | Status: DC | PRN

## 2019-09-01 MED ORDER — MAGNESIUM SULFATE IN D5W 1-5 GM/100ML-% IV SOLN
1.0000 g | Freq: Once | INTRAVENOUS | Status: AC
  Administered 2019-09-01: 1 g via INTRAVENOUS
  Filled 2019-09-01: qty 100

## 2019-09-01 MED ORDER — DM-GUAIFENESIN ER 30-600 MG PO TB12
1.0000 | ORAL_TABLET | Freq: Two times a day (BID) | ORAL | Status: DC | PRN
  Administered 2019-09-01: 1 via ORAL
  Filled 2019-09-01: qty 1

## 2019-09-01 MED ORDER — QUETIAPINE FUMARATE 25 MG PO TABS
25.0000 mg | ORAL_TABLET | Freq: Every day | ORAL | Status: DC
  Administered 2019-09-01 – 2019-09-04 (×4): 25 mg via ORAL
  Filled 2019-09-01 (×4): qty 1

## 2019-09-01 MED ORDER — HYDRALAZINE HCL 20 MG/ML IJ SOLN: 5.0000 mg | INTRAMUSCULAR | Status: DC | PRN

## 2019-09-01 MED ORDER — SODIUM CHLORIDE 0.9 % IV SOLN
100.0000 mg | INTRAVENOUS | Status: AC
  Administered 2019-09-02 – 2019-09-05 (×4): 100 mg via INTRAVENOUS
  Filled 2019-09-01 (×5): qty 20

## 2019-09-01 MED ORDER — IPRATROPIUM BROMIDE HFA 17 MCG/ACT IN AERS
2.0000 | INHALATION_SPRAY | RESPIRATORY_TRACT | Status: DC
  Administered 2019-09-01 – 2019-09-05 (×20): 2 via RESPIRATORY_TRACT
  Filled 2019-09-01: qty 12.9

## 2019-09-01 MED ORDER — TIZANIDINE HCL 4 MG PO TABS: 4.0000 mg | ORAL_TABLET | Freq: Four times a day (QID) | ORAL | Status: DC | PRN

## 2019-09-01 MED ORDER — PROPRANOLOL HCL ER 80 MG PO CP24
80.0000 mg | ORAL_CAPSULE | Freq: Every day | ORAL | Status: DC
  Administered 2019-09-01 – 2019-09-05 (×5): 80 mg via ORAL
  Filled 2019-09-01 (×7): qty 1

## 2019-09-01 MED ORDER — CYCLOBENZAPRINE HCL 5 MG PO TABS
5.0000 mg | ORAL_TABLET | Freq: Three times a day (TID) | ORAL | Status: DC | PRN
  Filled 2019-09-01: qty 1

## 2019-09-01 MED ORDER — SUMATRIPTAN SUCCINATE 50 MG PO TABS
50.0000 mg | ORAL_TABLET | ORAL | Status: DC | PRN
  Administered 2019-09-03: 50 mg via ORAL
  Filled 2019-09-01 (×2): qty 1

## 2019-09-01 MED ORDER — ACETAMINOPHEN 325 MG PO TABS
650.0000 mg | ORAL_TABLET | Freq: Four times a day (QID) | ORAL | Status: DC | PRN
  Administered 2019-09-02: 650 mg via ORAL
  Filled 2019-09-01 (×2): qty 2

## 2019-09-01 MED ORDER — ONDANSETRON HCL 4 MG/2ML IJ SOLN: 4.0000 mg | Freq: Four times a day (QID) | INTRAMUSCULAR | Status: DC | PRN

## 2019-09-01 MED ORDER — POTASSIUM CHLORIDE CRYS ER 20 MEQ PO TBCR
20.0000 meq | EXTENDED_RELEASE_TABLET | Freq: Once | ORAL | Status: AC
  Administered 2019-09-01: 12:00:00 20 meq via ORAL
  Filled 2019-09-01: qty 1

## 2019-09-01 MED ORDER — GABAPENTIN 300 MG PO CAPS: 1200.0000 mg | ORAL_CAPSULE | Freq: Three times a day (TID) | ORAL | Status: DC

## 2019-09-01 MED ORDER — POTASSIUM CHLORIDE 10 MEQ/100ML IV SOLN
10.0000 meq | INTRAVENOUS | Status: DC
  Administered 2019-09-01 (×2): 10 meq via INTRAVENOUS
  Filled 2019-09-01 (×2): qty 100

## 2019-09-01 MED ORDER — AMLODIPINE BESYLATE 5 MG PO TABS
10.0000 mg | ORAL_TABLET | Freq: Every day | ORAL | Status: DC
  Administered 2019-09-01 – 2019-09-05 (×5): 10 mg via ORAL
  Filled 2019-09-01 (×5): qty 2

## 2019-09-01 MED ORDER — PANTOPRAZOLE SODIUM 40 MG PO TBEC
40.0000 mg | DELAYED_RELEASE_TABLET | Freq: Every day | ORAL | Status: DC
  Administered 2019-09-01 – 2019-09-05 (×5): 40 mg via ORAL
  Filled 2019-09-01 (×5): qty 1

## 2019-09-01 MED ORDER — CLONIDINE HCL 0.1 MG PO TABS
0.1000 mg | ORAL_TABLET | Freq: Two times a day (BID) | ORAL | Status: DC
  Administered 2019-09-01: 0.1 mg via ORAL

## 2019-09-01 MED ORDER — HYDROXYZINE HCL 50 MG PO TABS
100.0000 mg | ORAL_TABLET | Freq: Three times a day (TID) | ORAL | Status: DC
  Administered 2019-09-01: 100 mg via ORAL
  Filled 2019-09-01 (×5): qty 2

## 2019-09-01 MED ORDER — CLONIDINE HCL 0.1 MG PO TABS
0.1000 mg | ORAL_TABLET | Freq: Four times a day (QID) | ORAL | Status: DC | PRN
  Filled 2019-09-01: qty 1

## 2019-09-01 MED ORDER — CYCLOBENZAPRINE HCL 10 MG PO TABS
10.0000 mg | ORAL_TABLET | Freq: Three times a day (TID) | ORAL | Status: DC | PRN
  Filled 2019-09-01: qty 1

## 2019-09-01 MED ORDER — CLONIDINE HCL 0.1 MG PO TABS: 0.1000 mg | ORAL_TABLET | Freq: Two times a day (BID) | ORAL | Status: DC

## 2019-09-01 MED ORDER — MORPHINE SULFATE (PF) 2 MG/ML IV SOLN
2.0000 mg | INTRAVENOUS | Status: DC | PRN
  Administered 2019-09-01 (×2): 2 mg via INTRAVENOUS
  Filled 2019-09-01 (×2): qty 1

## 2019-09-01 MED ORDER — OXYCODONE-ACETAMINOPHEN 5-325 MG PO TABS
1.0000 | ORAL_TABLET | ORAL | Status: DC | PRN
  Administered 2019-09-01: 1 via ORAL
  Filled 2019-09-01: qty 1

## 2019-09-01 MED ORDER — VITAMIN C 500 MG PO TABS
500.0000 mg | ORAL_TABLET | Freq: Every day | ORAL | Status: DC
  Administered 2019-09-01 – 2019-09-05 (×5): 500 mg via ORAL
  Filled 2019-09-01 (×5): qty 1

## 2019-09-01 MED ORDER — TOCILIZUMAB 400 MG/20ML IV SOLN
8.0000 mg/kg | Freq: Once | INTRAVENOUS | Status: AC
  Administered 2019-09-01: 544 mg via INTRAVENOUS
  Filled 2019-09-01: qty 20

## 2019-09-01 MED ORDER — METHYLPREDNISOLONE SODIUM SUCC 40 MG IJ SOLR: 30.0000 mg | Freq: Two times a day (BID) | INTRAMUSCULAR | Status: DC

## 2019-09-01 MED ORDER — ESCITALOPRAM OXALATE 10 MG PO TABS
20.0000 mg | ORAL_TABLET | Freq: Every day | ORAL | Status: DC
  Administered 2019-09-01 – 2019-09-05 (×5): 20 mg via ORAL
  Filled 2019-09-01 (×5): qty 2

## 2019-09-01 MED ORDER — POTASSIUM CHLORIDE CRYS ER 20 MEQ PO TBCR
40.0000 meq | EXTENDED_RELEASE_TABLET | Freq: Once | ORAL | Status: AC
  Administered 2019-09-01: 03:00:00 40 meq via ORAL
  Filled 2019-09-01: qty 2

## 2019-09-01 MED ORDER — HYDRALAZINE HCL 50 MG PO TABS
50.0000 mg | ORAL_TABLET | Freq: Three times a day (TID) | ORAL | Status: DC
  Administered 2019-09-01 – 2019-09-02 (×3): 50 mg via ORAL
  Filled 2019-09-01 (×3): qty 1

## 2019-09-01 MED ORDER — ISOSORBIDE MONONITRATE ER 30 MG PO TB24: 60.0000 mg | ORAL_TABLET | Freq: Every day | ORAL | Status: DC

## 2019-09-01 MED ORDER — METHYLPREDNISOLONE SODIUM SUCC 125 MG IJ SOLR
60.0000 mg | Freq: Two times a day (BID) | INTRAMUSCULAR | Status: DC
  Administered 2019-09-01 (×2): 60 mg via INTRAVENOUS
  Filled 2019-09-01 (×3): qty 2

## 2019-09-01 MED ORDER — IOHEXOL 350 MG/ML SOLN
100.0000 mL | Freq: Once | INTRAVENOUS | Status: AC | PRN
  Administered 2019-09-01: 01:00:00 100 mL via INTRAVENOUS

## 2019-09-01 MED ORDER — ENOXAPARIN SODIUM 40 MG/0.4ML ~~LOC~~ SOLN
40.0000 mg | SUBCUTANEOUS | Status: DC
  Administered 2019-09-01 – 2019-09-05 (×5): 40 mg via SUBCUTANEOUS
  Filled 2019-09-01 (×6): qty 0.4

## 2019-09-01 MED ORDER — HALOPERIDOL LACTATE 5 MG/ML IJ SOLN: 2.0000 mg | Freq: Four times a day (QID) | INTRAMUSCULAR | Status: DC | PRN

## 2019-09-01 MED ORDER — GABAPENTIN 300 MG PO CAPS
600.0000 mg | ORAL_CAPSULE | Freq: Three times a day (TID) | ORAL | Status: DC
  Administered 2019-09-01 (×2): 600 mg via ORAL
  Filled 2019-09-01 (×4): qty 2

## 2019-09-01 MED ORDER — NICOTINE 21 MG/24HR TD PT24
21.0000 mg | MEDICATED_PATCH | Freq: Every day | TRANSDERMAL | Status: DC
  Administered 2019-09-01 – 2019-09-05 (×5): 21 mg via TRANSDERMAL
  Filled 2019-09-01 (×6): qty 1

## 2019-09-01 NOTE — ED Notes (Signed)
[facility]link called for transport 

## 2019-09-01 NOTE — Progress Notes (Addendum)
PROGRESS NOTE  Emma Gould NAT:[id number] DOB: 1956/11/17 DOA: 08/31/2019 PCP: System, Pcp Not In  Brief History   63 year old woman PMH of hypertension, GERD, fibromyalgia, migraine headaches presented with fever, chills, abdominal pain and diarrhea.  Found to be hypoxic, COVID-19 positive with infiltrates on chest x-ray and CT angiogram.  Admitted for acute hypoxic respiratory failure secondary COVID-19 pneumonia, abdominal pain, diarrhea with marked hypokalemia.  A & P  Acute hypoxic respiratory failure secondary to COVID-19 pneumonia, chest x-ray and CT chest positive infiltrates. --When seen was stable with oxygen saturation 85 to 88% on 4 L nasal cannula.  No tachypnea or dyspnea. --Continue steroids, remdesivir, vitamin C, zinc --I discussed Actemra use with this patient and advised consideration of this should her hypoxia worsened.  No contraindications were identified.  Patient consented to use as needed.  Care transferred to Dr. Candiss Norse, Actemra ordered.  --Follow inflammatory markers --Prone if able  Remdesivir 9/5 > Actemra 9/5  D-dimer 1.5 > Ferritin 190 > 219 LDH 385 > 363 CRP 6.6 > 7.7  Marked hypokalemia secondary to profuse diarrhea --Potassium much improved after aggressive repletion, currently 3.5.  Will give 4 additional rounds.  Check BMP later today. --Magnesium 2.6 --Repeat BMP and magnesium in a.m.  Generalized abdominal pain with associated diarrhea --Abdominal exam remains soft, benign.  No recent antibiotic use.  No reason to suspect acute intra-abdominal bacterial process.  Suspect abdominal pain diarrhea is secondary to COVID.  --Repeat abdominal exam in a.m.  Prolonged QT --Chronic compared to previous EKG.  Appears stable.  Repeat EKG in a.m.  Continue to replace potassium.  Essential hypertension --Continue amlodipine, clonidine, propranolol  GERD --Continue Protonix  Smoker --Nicotine patch  . Appears stable on 4 L nasal cannula for transfer  to [place]. Case discussed with Dr. Candiss Norse.  Resolved Hospital Problem list       DVT prophylaxis: Enoxaparin Code Status: Full Family Communication: none Disposition Plan: home    Murray Hodgkins, MD  [facility] Direct contact: see [email or web address] (further directions at bottom of note if needed) 7PM-7AM contact night coverage as at bottom of note 09/01/2019, 1:30 PM  LOS: 0 days   Significant Hospital Events   . 9/5 presented with hypoxia, abdominal pain and diarrhea.  Admitted for respiratory failure secondary to improving pneumonia.  Potassium 2.3.   Consults:  .    Procedures:  .   Significant Diagnostic Tests:  . 9/5 SARS-CoV-2 positive.  Lactic acid negative.  LFTs unremarkable.  CBC unremarkable. . 9/5 chest x-ray patchy bilateral airspace opacities . 9/5 CTA chest showed patchy airspace opacities throughout both lungs consistent with viral pneumonia.  No PE. Marland Kitchen 9/5 EKG sinus rhythm, shortened PR, prolonged QT, nonspecific ST changes.  Compared to previous study 02/15/2010 prolonged QT is old and stable.   Micro Data:  . 9/4 blood cultures pending   Antimicrobials:  . remdesivir 9/5 >  Interval History/Subjective  Feels okay right now in regard to breathing.  Not particularly short of breath.  Generalized abdominal pain continues.  Reports watery diarrhea.  Objective   Vitals:  Vitals:   09/01/19 0959 09/01/19 1002  BP: (!) 165/83 (!) 191/84  Pulse: 90 88  Resp: 20 20  Temp:  98.9 F (37.2 C)  SpO2: (!) 87% (!) 87%    Exam:  Constitutional:  . Appears calm, uncomfortable but not toxic Eyes:  Marland Kitchen Appear grossly normal ENMT:  . grossly normal hearing  Respiratory:  . CTA bilaterally, no w/r/r.  . Respiratory  effort normal.  Cardiovascular:  . RRR, no m/r/g . No LE extremity edema   Abdomen:  . Abdomen appears normal; no masses.  Tender to palpation, generalized.  No suprapubic tenderness.  No rebound or guarding. . No hernias noted Skin:  . No  rashes, lesions, ulcers identified . palpation of skin: no induration or nodules Psychiatric:  . Mental status o Mood, affect appropriate  I have personally reviewed the following:   Today's Data  . Potassium 3.5, remainder BMP unremarkable.  Magnesium 2.6.  LFTs unremarkable.  Inflammatory markers noted.  Scheduled Meds: . amLODipine  10 mg Oral Daily  . enoxaparin (LOVENOX) injection  40 mg Subcutaneous Q24H  . escitalopram  20 mg Oral Daily  . gabapentin  600 mg Oral TID  . hydrALAZINE  50 mg Oral Q8H  . hydrOXYzine  100 mg Oral TID  . ipratropium  2 puff Inhalation Q4H  . methylPREDNISolone (SOLU-MEDROL) injection  60 mg Intravenous Q12H  . nicotine  21 mg Transdermal Daily  . pantoprazole  40 mg Oral Daily  . propranolol ER  80 mg Oral Daily  . vitamin C  500 mg Oral Daily  . zinc sulfate  220 mg Oral Daily   Continuous Infusions: . [START ON 09/02/2019] remdesivir 100 mg in NS 250 mL    . tocilizumab (ACTEMRA) IV      Principal Problem:   Acute respiratory disease due to COVID-19 virus Active Problems:   Hypertension   GERD (gastroesophageal reflux disease)   Depression   Migraine   Tobacco abuse   Hypokalemia   Diarrhea   Abdominal pain   LOS: 0 days   How to contact the [facility] Attending or Consulting provider 7A - 7P or covering provider during after hours 7P -7A, for this patient?  1. Check the care team in [facility] and look for a) attending/consulting [facility] provider listed and b) the [facility] team listed 2. Log into [email or web address] and use [place]'s universal password to access. If you do not have the password, please contact the hospital operator. 3. Locate the [facility] provider you are looking for under [facility] and page to a number that you can be directly reached. 4. If you still have difficulty reaching the provider, please page the [facility] (Director on Call) for the Hospitalists listed on amion for assistance.   Time approximately 930: 10:00, 1330-1350 prolonged  services, coordination of care, discussion with accepting physician, review of chart, examination of patient, updating a plan.

## 2019-09-01 NOTE — H&P (Signed)
History and Physical    [name] XBM:[id number] DOB: 26-Mar-1956 DOA: 08/31/2019  Referring MD/NP/PA:   PCP: System, Pcp Not In   Patient coming from:  The patient is coming from home.  At baseline, pt is independent for most of ADL.        Chief Complaint: Fever, chills, shortness of breath, cough, abdominal pain, diarrhea  HPI: Emma Gould is a 63 y.o. female with medical history significant of hypertension, GERD, depression, migraine headache, tobacco abuse, fibromyalgia, who presents with fever, chills, shortness of breath, cough, abdominal pain, diarrhea.  Patient states that she has been having fever, chills, shortness breath, cough for more than 6 days, which has worsened today.  She has intermittent mild chest pain, currently no chest pain.  She coughs up green-colored mucus.  She also reports diffused abdominal pain, diarrhea.  She has had 5 times of loose stool bowel movement today.  She has nausea, no vomiting.  Denies symptoms of UTI or unilateral weakness. She states that she has bilateral lower leg pain which she suspects it is due to her fibromyalgia. Patient lives alone and has no obvious sick contacts.  Patient has oxygen desaturation to 88% on room air, which improved to 97% on 4 L nasal cannula oxygen.  ED Course: pt was found to have positive COVID-19, WBC 10.9, lipase 34, lactic acid 1.6, d-dimer 1.5, potassium 2.3, renal function normal, temperature 99.5, blood pressure 173/81, heart rate 82, tachypnea.  Chest x-ray showed bilateral patchy infiltration.  CT angiogram is negative for PE.  Patient is admitted to telemetry bed as inpatient.  Review of Systems:   General: has fevers, chills, no body weight gain, has poor appetite, has fatigue HEENT: no blurry vision, hearing changes or sore throat Respiratory: has dyspnea, coughing, no wheezing CV: has chest pain, no palpitations GI: has nausea,  abdominal pain, diarrhea, no constipation or vomiting, GU: no dysuria,  burning on urination, increased urinary frequency, hematuria  Ext: no leg edema Neuro: no unilateral weakness, numbness, or tingling, no vision change or hearing loss Skin: no rash, no skin tear. MSK: No muscle spasm, no deformity, no limitation of range of movement in spin Heme: No easy bruising.  Travel history: No recent long distant travel.  Allergy: No Known Allergies  Past Medical History:  Diagnosis Date  . Fibromyalgia   . Hypertension     Past Surgical History:  Procedure Laterality Date  . CERVICAL FUSION      Social History:  reports that she has been smoking. She has never used smokeless tobacco. She reports that she does not drink alcohol or use drugs.  Family History:  Family History  Problem Relation Age of Onset  . Stroke Mother   . Hypertension Mother   . Lung cancer Father        Father died of lung cancer     Prior to Admission medications   Medication Sig Start Date End Date Taking? Authorizing Provider  acetaminophen (TYLENOL) 500 MG tablet Take 1,500 mg by mouth every 6 (six) hours as needed for moderate pain.   Yes [provider]  albuterol (VENTOLIN HFA) 108 (90 Base) MCG/ACT inhaler Inhale 2 puffs into the lungs 4 (four) times daily as needed for shortness of breath.   Yes [provider]  cloNIDine (CATAPRES) 0.1 MG tablet Take 0.1 mg by mouth 2 (two) times daily. 06/11/19  Yes [provider]  escitalopram (LEXAPRO) 20 MG tablet Take 20 mg by mouth daily. 08/07/19  Yes [provider]  furosemide (LASIX) 20 MG tablet Take 20 mg by mouth daily.  06/11/19  Yes [provider]  gabapentin (NEURONTIN) 400 MG capsule Take 1,200 mg by mouth 3 (three) times daily. 08/08/19  Yes [provider]  hydrOXYzine (ATARAX/VISTARIL) 50 MG tablet Take 100 mg by mouth 3 (three) times daily. 04/25/19  Yes [provider]  omeprazole (PRILOSEC) 40 MG capsule Take 40 mg by mouth daily. 06/11/19  Yes [provider]  propranolol ER (INDERAL LA) 80 MG 24 hr capsule Take 80 mg by mouth daily. 06/11/19  Yes [provider]  SUMAtriptan (IMITREX) 50 MG tablet Take 50 mg by mouth as needed for migraine. Take a second tablet 2 hours later if needed; max 200mg /day 12/21/18  Yes [provider]  tiZANidine (ZANAFLEX) 4 MG tablet Take 4 mg by mouth every 6 (six) hours as needed. 08/08/19 09/07/19 Yes [provider]  triamcinolone cream (KENALOG) 0.1 % Apply 1 application topically See admin instructions. 2 to 3 times a day 08/06/19  Yes [provider]    Physical Exam: Vitals:   09/01/19 0200 09/01/19 0215 09/01/19 0230 09/01/19 0424  BP:    (!) 165/79  Pulse: 88 82 78 76  Resp: 18 19 (!) 23 (!) 21  Temp:      TempSrc:      SpO2: 96% 96% 94% 93%  Weight:      Height:       General: Not in acute distress. HEENT:       Eyes: PERRL, EOMI, no scleral icterus.       ENT: No discharge from the ears and nose, no pharynx injection, no tonsillar enlargement.        Neck: No JVD, no bruit, no mass felt. Heme: No neck lymph node enlargement. Cardiac: S1/S2, RRR, No murmurs, No gallops or rubs. Respiratory: Has coarse breathing sound bilaterally GI: Soft, nondistended, has diffused tenderness, no rebound pain, no organomegaly, BS present. GU: No hematuria Ext: No pitting leg edema bilaterally. 2+DP/PT pulse bilaterally. Musculoskeletal: No joint deformities, No joint redness or warmth, no limitation of ROM in spin. Skin: No rashes.  Neuro: Alert, oriented X3, cranial nerves II-XII grossly intact, moves all extremities normally Psych: Patient is not psychotic, no suicidal or hemocidal ideation.  Labs on Admission: I have personally reviewed following labs and imaging studies  CBC: Recent Labs  Lab 08/31/19 2154  WBC 10.9*  NEUTROABS 8.9*  HGB 14.3  HCT 42.3  MCV 85.1  PLT 505*   Basic Metabolic Panel: Recent Labs  Lab 08/31/19 2154  NA 139  K 2.3*   CL 100  CO2 25  GLUCOSE 107*  BUN 13  CREATININE 0.89  CALCIUM 8.9   GFR: Estimated Creatinine Clearance: 61.4 mL/min (by C-G formula based on SCr of 0.89 mg/dL). Liver Function Tests: Recent Labs  Lab 08/31/19 2154  AST 33  ALT 21  ALKPHOS 101  BILITOT 0.7  PROT 7.8  ALBUMIN 2.9*   Recent Labs  Lab 08/31/19 2154  LIPASE 34   No results for input(s): AMMONIA in the last 168 hours. Coagulation Profile: No results for input(s): INR, PROTIME in the last 168 hours. Cardiac Enzymes: No results for input(s): CKTOTAL, CKMB, CKMBINDEX, TROPONINI in the last 168 hours. BNP (last 3 results) No results for input(s): PROBNP in the last 8760 hours. HbA1C: No results for input(s): HGBA1C in the last 72 hours. CBG: No results for input(s): GLUCAP in the last  168 hours. Lipid Profile: Recent Labs    08/31/19 0028  TRIG 140   Thyroid Function Tests: No results for input(s): TSH, T4TOTAL, FREET4, T3FREE, THYROIDAB in the last 72 hours. Anemia Panel: Recent Labs    08/31/19 2310  FERRITIN 190   Urine analysis: No results found for: COLORURINE, APPEARANCEUR, LABSPEC, PHURINE, GLUCOSEU, HGBUR, BILIRUBINUR, KETONESUR, PROTEINUR, UROBILINOGEN, NITRITE, LEUKOCYTESUR Sepsis Labs: @LABRCNTIP (procalcitonin:4,lacticidven:4) ) Recent Results (from the past 240 hour(s))  SARS Coronavirus 2 [facility](Hospital order, Performed in [facility] lab) Nasopharyngeal Nasopharyngeal Swab     Status: Abnormal   Collection Time: 08/31/19  9:51 PM   Specimen: Nasopharyngeal Swab  Result Value Ref Range Status   SARS Coronavirus 2 POSITIVE (A) NEGATIVE Final    Comment: RESULT CALLED TO, READ BACK BY AND VERIFIED WITH: Neita GarnetS LOUDERMILK RN 08/31/19 2331 JDW (NOTE) If result is NEGATIVE SARS-CoV-2 target nucleic acids are NOT DETECTED. The SARS-CoV-2 RNA is generally detectable in upper and lower  respiratory specimens during the acute phase of infection. The lowest  concentration of SARS-CoV-2  viral copies this assay can detect is 250  copies / mL. A negative result does not preclude SARS-CoV-2 infection  and should not be used as the sole basis for treatment or other  patient management decisions.  A negative result may occur with  improper specimen collection / handling, submission of specimen other  than nasopharyngeal swab, presence of viral mutation(s) within the  areas targeted by this assay, and inadequate number of viral copies  (<250 copies / mL). A negative result must be combined with clinical  observations, patient history, and epidemiological information. If result is POSITIVE SARS-CoV-2 target nucleic acids are DETECTED. The S ARS-CoV-2 RNA is generally detectable in upper and lower  respiratory specimens during the acute phase of infection.  Positive  results are indicative of active infection with SARS-CoV-2.  Clinical  correlation with patient history and other diagnostic information is  necessary to determine patient infection status.  Positive results do  not rule out bacterial infection or co-infection with other viruses. If result is PRESUMPTIVE POSTIVE SARS-CoV-2 nucleic acids MAY BE PRESENT.   A presumptive positive result was obtained on the submitted specimen  and confirmed on repeat testing.  While 2019 novel coronavirus  (SARS-CoV-2) nucleic acids may be present in the submitted sample  additional confirmatory testing may be necessary for epidemiological  and / or clinical management purposes  to differentiate between  SARS-CoV-2 and other Sarbecovirus currently known to infect humans.  If clinically indicated additional testing with an alternate test  methodology [phone] is adv ised. The SARS-CoV-2 RNA is generally  detectable in upper and lower respiratory specimens during the acute  phase of infection. The expected result is Negative. Fact Sheet for Patients:  BoilerBrush.com.cyhttps://www.fda.gov/media/136312/download Fact Sheet for Healthcare  Providers: https://pope.com/https://www.fda.gov/media/136313/download This test is not yet approved or cleared by the [place] FDA and has been authorized for detection and/or diagnosis of SARS-CoV-2 by FDA under an Emergency Use Authorization (EUA).  This EUA will remain in effect (meaning this test can be used) for the duration of the COVID-19 declaration under Section 564(b)(1) of the Act, 21 U.S.C. section 360bbb-3(b)(1), unless the authorization is terminated or revoked sooner. Performed at [facility], [address] [place], [place] [address]      Radiological Exams on Admission: Ct Angio Chest Pe W And/or Wo Contrast  Result Date: 09/01/2019 CLINICAL DATA:  Abdominal pain, O2 saturation 88% on room air EXAM: CT ANGIOGRAPHY  CHEST WITH CONTRAST TECHNIQUE: Multidetector CT imaging of the chest was performed using the standard protocol during bolus administration of intravenous contrast. Multiplanar CT image reconstructions and MIPs were obtained to evaluate the vascular anatomy. CONTRAST:  OMNIPAQUE IOHEXOL 350 MG/ML SOLN COMPARISON:  None. FINDINGS: Cardiovascular: There is a optimal opacification of the pulmonary arteries. There is no central,segmental, or subsegmental filling defects within the pulmonary arteries. The heart is normal in size. No pericardial effusion thickening. No evidence right heart strain. There is normal three-vessel brachiocephalic anatomy without proximal stenosis. The thoracic aorta is normal in appearance. Mediastinum/Nodes: No hilar, mediastinal, or axillary adenopathy. Thyroid gland, trachea, and esophagus demonstrate no significant findings. Lungs/Pleura: There is dense of patchy airspace opacities seen within the periphery and predominantly within the upper lobes. No pleural effusion is seen. Upper Abdomen: No acute abnormalities present in the visualized portions of the upper abdomen. Musculoskeletal: No chest wall abnormality. No acute or significant osseous  findings. Review of the MIP images confirms the above findings. IMPRESSION: Patchy airspace opacities throughout both lungs. There are a spectrum of findings in the lungs which can be seen with acute atypical infection (as well as other non-infectious etiologies). In particular, viral pneumonia (including COVID-19) should be considered in the appropriate clinical setting. No central, segmental, or subsegmental pulmonary embolism. Electronically Signed   By: Jonna Clark M.D.   On: 09/01/2019 00:59   Dg Chest Portable 1 View  Result Date: 08/31/2019 CLINICAL DATA:  Shortness of breath EXAM: PORTABLE CHEST 1 VIEW COMPARISON:  01/07/2018 FINDINGS: Heart is upper limits normal in size. Patchy bilateral airspace opacities are noted concerning for multifocal pneumonia. No effusion or pneumothorax. No acute bony abnormality. IMPRESSION: Patchy bilateral airspace opacities concerning for pneumonia. Electronically Signed   By: Charlett Nose M.D.   On: 08/31/2019 22:01     EKG: Independently reviewed.  Sinus rhythm, QTC 490, no ischemic change.  Assessment/Plan Principal Problem:   Acute respiratory disease due to COVID-19 virus Active Problems:   Hypertension   GERD (gastroesophageal reflux disease)   Depression   Migraine   Tobacco abuse   Hypokalemia   Diarrhea   Abdominal pain   Acute respiratory disease due to COVID-19 virus: Has oxygen desaturation to 82%, which improved to 97% on 4 L nasal cannula oxygen.  Chest x-ray positive for bilateral patchy infiltration.  -will admit to tele as inpt -Remdesivir per pharm -Solumedrol 30 mg bid -vitamin C, zinc.   -Atrovent inhaler, PRN albuterol inhaler -PRN Mucinex for cough -Follow-up flu PCR and RVP -f/u Blood culture -Gentle IV fluid: none -D-dimer, BNP,Trop, LFT, CRP, LDH, Procalcitonin, IL-6, ferritin, fibinogen, TG, Hep B SAg, HIV ab -Daily CRP, Ferritin, D-dimer, -Will ask the patient to maintain an awake prone position for 16+ hours a  day, if possible, with a minimum of 2-3 hours at a time -Will attempt to maintain euvolemia to a net negative fluid status -Patient was seen wearing full PPE including: gown, gloves, head cover, N95 -IF patient deteriorates, will consult PCCM and ID  Abdominal pain and diarrhea: Most likely due to COVID-19 infection.  Abdomen is soft on examination, no acute abdomen. -As needed morphine and Zofran  Essential hypertension: -IV Hydralazine prn -Continue home medications: Clonidine, propranolol -Hold Lasix due to diarrhea  GERD (gastroesophageal reflux disease): -Protonix  Depression: -Lexapro  Migraine: -As needed Imitrex  Tobacco abuse: -Nicotine patch  Hypokalemia: K=2.3 on admission. - Repleted with total 120 mEQ of KCl - Check Mg level - Give 1  g of magnesium sulfate     DVT ppx:  SQ Lovenox Code Status: Full code Family Communication: None at bed side.     Disposition Plan:  Anticipate discharge back to previous home environment Consults called:  none Admission status:  Inpatient/tele     Date of Service 09/01/2019    Lorretta HarpXilin Shaymus Eveleth [facility]   If 7PM-7AM, please contact night-coverage [email or web address] Password [email or web address] 09/01/2019, 5:16 AM

## 2019-09-01 NOTE — ED Notes (Signed)
ED TO INPATIENT HANDOFF REPORT  ED Nurse Name and Phone #: will Latasha Buczkowski  S Name/Age/Gender Emma Gould 63 y.o. female Room/Bed: 016C/016C  Code Status   Code Status: Not on file  Home/SNF/Other Home Patient oriented to: self, place, time and situation Is this baseline? Yes   Triage Complete: Triage complete  Chief Complaint headache htn  Triage Note Pt arrived via [facility]; pt with multiple complaints; c/o abd pin; also HA x 4 days; EMS reports HTN; pt sating at 88% on RA upon EMS arrival and placed on 4 L via [place]; pt does not wear O2 at hm; 167/104; 20; 93; 95% on 4 L; pt rec'd 4mg  of Zofran; EMS reports that pt had multiple meds on kitchen table and unable to state what was taken, new, or what was not taken   Allergies No Known Allergies  Level of Care/Admitting Diagnosis ED Disposition    ED Disposition Condition [place]: [place] [[id number]]  Level of Care: Telemetry [5]  Covid Evaluation: Confirmed COVID Positive  Diagnosis: Acute respiratory disease due to COVID-19 virus [[id number]]  Admitting Physician: Ivor Costa [[id number]]  Attending Physician: Ivor Costa [[id number]]  Estimated length of stay: past midnight tomorrow  Certification:: I certify this patient will need inpatient services for at least 2 midnights  PT Class (Do Not Modify): Inpatient [101]  PT Acc Code (Do Not Modify): Private [1]       B Medical/Surgery History Past Medical History:  Diagnosis Date  . Fibromyalgia   . Hypertension    History reviewed. No pertinent surgical history.   A IV Location/Drains/Wounds Patient Lines/Drains/Airways Status   Active Line/Drains/Airways    Name:   Placement date:   Placement time:   Site:   Days:   Peripheral IV 08/31/19 Right Antecubital   08/31/19    2300    Antecubital   1   Peripheral IV 09/01/19 Left Antecubital   09/01/19    0018    Antecubital   less than 1          Intake/Output Last 24  hours  Intake/Output Summary (Last 24 hours) at 09/01/2019 0254 Last data filed at 09/01/2019 0213 Gross per 24 hour  Intake 199.65 ml  Output -  Net 199.65 ml    Labs/Imaging Results for orders placed or performed during the hospital encounter of 08/31/19 (from the past 48 hour(s))  Triglycerides     Status: None   Collection Time: 08/31/19 12:28 AM  Result Value Ref Range   Triglycerides 140 <150 mg/dL    Comment: Performed at [facility], [place] [address], [place], [place] [address]  Brain natriuretic peptide     Status: None   Collection Time: 08/31/19 12:29 AM  Result Value Ref Range   B Natriuretic Peptide 61.2 0.0 - 100.0 pg/mL    Comment: Performed at [place] [address], [place], [place] [address]  SARS Coronavirus 2 [facility] order, Performed in [facility] hospital lab) Nasopharyngeal Nasopharyngeal Swab     Status: Abnormal   Collection Time: 08/31/19  9:51 PM   Specimen: Nasopharyngeal Swab  Result Value Ref Range   SARS Coronavirus 2 POSITIVE (A) NEGATIVE    Comment: RESULT CALLED TO, READ BACK BY AND VERIFIED WITH: Rockne Coons RN 08/31/19 2331 JDW (NOTE) If result is NEGATIVE SARS-CoV-2 target nucleic acids are NOT DETECTED. The SARS-CoV-2 RNA is generally detectable in upper and lower  respiratory specimens during  the acute phase of infection. The lowest  concentration of SARS-CoV-2 viral copies this assay can detect is 250  copies / mL. A negative result does not preclude SARS-CoV-2 infection  and should not be used as the sole basis for treatment or other  patient management decisions.  A negative result may occur with  improper specimen collection / handling, submission of specimen other  than nasopharyngeal swab, presence of viral mutation(s) within the  areas targeted by this assay, and inadequate number of viral copies  (<250 copies / mL). A negative result must be combined with clinical  observations, patient history, and epidemiological  information. If result is POSITIVE SARS-CoV-2 target nucleic acids are DETECTED. The S ARS-CoV-2 RNA is generally detectable in upper and lower  respiratory specimens during the acute phase of infection.  Positive  results are indicative of active infection with SARS-CoV-2.  Clinical  correlation with patient history and other diagnostic information is  necessary to determine patient infection status.  Positive results do  not rule out bacterial infection or co-infection with other viruses. If result is PRESUMPTIVE POSTIVE SARS-CoV-2 nucleic acids MAY BE PRESENT.   A presumptive positive result was obtained on the submitted specimen  and confirmed on repeat testing.  While 2019 novel coronavirus  (SARS-CoV-2) nucleic acids may be present in the submitted sample  additional confirmatory testing may be necessary for epidemiological  and / or clinical management purposes  to differentiate between  SARS-CoV-2 and other Sarbecovirus currently known to infect humans.  If clinically indicated additional testing with an alternate test  methodology [phone]) is adv ised. The SARS-CoV-2 RNA is generally  detectable in upper and lower respiratory specimens during the acute  phase of infection. The expected result is Negative. Fact Sheet for Patients:  BoilerBrush.com.cy Fact Sheet for Healthcare Providers: https://pope.com/ This test is not yet approved or cleared by the [place] FDA and has been authorized for detection and/or diagnosis of SARS-CoV-2 by FDA under an Emergency Use Authorization (EUA).  This EUA will remain in effect (meaning this test can be used) for the duration of the COVID-19 declaration under Section 564(b)(1) of the Act, 21 U.S.C. section 360bbb-3(b)(1), unless the authorization is terminated or revoked sooner. Performed at [facility], [address], [place], [place] [address]   Comprehensive metabolic panel      Status: Abnormal   Collection Time: 08/31/19  9:54 PM  Result Value Ref Range   Sodium 139 135 - 145 mmol/L   Potassium 2.3 (LL) 3.5 - 5.1 mmol/L    Comment: CRITICAL RESULT CALLED TO, READ BACK BY AND VERIFIED WITH: LOWDERMILK,S RN 08/31/2019 2258 JORDANS    Chloride 100 98 - 111 mmol/L   CO2 25 22 - 32 mmol/L   Glucose, Bld 107 (H) 70 - 99 mg/dL   BUN 13 8 - 23 mg/dL   Creatinine, Ser 4.40 0.44 - 1.00 mg/dL   Calcium 8.9 8.9 - 10.2 mg/dL   Total Protein 7.8 6.5 - 8.1 g/dL   Albumin 2.9 (L) 3.5 - 5.0 g/dL   AST 33 15 - 41 U/L   ALT 21 0 - 44 U/L   Alkaline Phosphatase 101 38 - 126 U/L   Total Bilirubin 0.7 0.3 - 1.2 mg/dL   GFR calc non Af Amer >60 >60 mL/min   GFR calc Af Amer >60 >60 mL/min   Anion gap 14 5 - 15    Comment: Performed at [facility], [address]  [place], [place] [address]  Lipase, blood     Status: None   Collection Time: 08/31/19  9:54 PM  Result Value Ref Range   Lipase 34 11 - 51 U/L    Comment: Performed at [facility], [address] [place], [place] [address]  CBC with Diff     Status: Abnormal   Collection Time: 08/31/19  9:54 PM  Result Value Ref Range   WBC 10.9 (H) 4.0 - 10.5 K/uL   RBC 4.97 3.87 - 5.11 MIL/uL   Hemoglobin 14.3 12.0 - 15.0 g/dL   HCT 09.842.3 11.936.0 - 14.746.0 %   MCV 85.1 80.0 - 100.0 fL   MCH 28.8 26.0 - 34.0 pg   MCHC 33.8 30.0 - 36.0 g/dL   RDW 82.914.6 56.211.5 - 13.015.5 %   Platelets 505 (H) 150 - 400 K/uL   nRBC 0.5 (H) 0.0 - 0.2 %   Neutrophils Relative % 82 %   Neutro Abs 8.9 (H) 1.7 - 7.7 K/uL   Lymphocytes Relative 13 %   Lymphs Abs 1.4 0.7 - 4.0 K/uL   Monocytes Relative 5 %   Monocytes Absolute 0.5 0.1 - 1.0 K/uL   Eosinophils Relative 0 %   Eosinophils Absolute 0.0 0.0 - 0.5 K/uL   Basophils Relative 0 %   Basophils Absolute 0.0 0.0 - 0.1 K/uL   WBC Morphology See Note     Comment: Increased Bands. >20% Bands   nRBC 0 0 /100 WBC   Abs Immature Granulocytes 0.00 0.00 - 0.07 K/uL   Tear Drop Cells  PRESENT     Comment: Performed at [facility], [address] [place], [place] [address]  Lactic acid, plasma     Status: None   Collection Time: 08/31/19 10:44 PM  Result Value Ref Range   Lactic Acid, Venous 1.6 0.5 - 1.9 mmol/L    Comment: Performed at [facility], [address] [place], [place] [address]  D-dimer, quantitative     Status: Abnormal   Collection Time: 08/31/19 11:10 PM  Result Value Ref Range   D-Dimer, Quant 1.50 (H) 0.00 - 0.50 ug/mL-FEU    Comment: (NOTE) At the manufacturer cut-off of 0.50 ug/mL FEU, this assay has been documented to exclude PE with a sensitivity and negative predictive value of 97 to 99%.  At this time, this assay has not been approved by the FDA to exclude DVT/VTE. Results should be correlated with clinical presentation. Performed at [facility] [place] [facility], [address] [place], [place] [address]   Lactate dehydrogenase     Status: Abnormal   Collection Time: 08/31/19 11:10 PM  Result Value Ref Range   LDH 385 (H) 98 - 192 U/L    Comment: Performed at [facility], [address] [place], [place] [address]  Ferritin     Status: None   Collection Time: 08/31/19 11:10 PM  Result Value Ref Range   Ferritin 190 11 - 307 ng/mL    Comment: Performed at [facility], [address] [place], [place] [address]  Fibrinogen     Status: Abnormal   Collection Time: 08/31/19 11:10 PM  Result Value Ref Range   Fibrinogen 635 (H) 210 - 475 mg/dL    Comment: Performed at [facility], [address] [place], [place] [address]  C-reactive protein     Status: Abnormal   Collection Time: 08/31/19 11:10 PM  Result Value Ref Range  CRP 6.6 (H) <1.0 mg/dL    Comment: Performed at [facility], [address], [place], [place] [address]   Ct Angio Chest Pe W And/or Wo Contrast  Result Date: 09/01/2019 CLINICAL DATA:  Abdominal pain, O2 saturation 88% on room air EXAM: CT ANGIOGRAPHY CHEST WITH CONTRAST TECHNIQUE:  Multidetector CT imaging of the chest was performed using the standard protocol during bolus administration of intravenous contrast. Multiplanar CT image reconstructions and MIPs were obtained to evaluate the vascular anatomy. CONTRAST:  OMNIPAQUE IOHEXOL 350 MG/ML SOLN COMPARISON:  None. FINDINGS: Cardiovascular: There is a optimal opacification of the pulmonary arteries. There is no central,segmental, or subsegmental filling defects within the pulmonary arteries. The heart is normal in size. No pericardial effusion thickening. No evidence right heart strain. There is normal three-vessel brachiocephalic anatomy without proximal stenosis. The thoracic aorta is normal in appearance. Mediastinum/Nodes: No hilar, mediastinal, or axillary adenopathy. Thyroid gland, trachea, and esophagus demonstrate no significant findings. Lungs/Pleura: There is dense of patchy airspace opacities seen within the periphery and predominantly within the upper lobes. No pleural effusion is seen. Upper Abdomen: No acute abnormalities present in the visualized portions of the upper abdomen. Musculoskeletal: No chest wall abnormality. No acute or significant osseous findings. Review of the MIP images confirms the above findings. IMPRESSION: Patchy airspace opacities throughout both lungs. There are a spectrum of findings in the lungs which can be seen with acute atypical infection (as well as other non-infectious etiologies). In particular, viral pneumonia (including COVID-19) should be considered in the appropriate clinical setting. No central, segmental, or subsegmental pulmonary embolism. Electronically Signed   By: Jonna Clark M.D.   On: 09/01/2019 00:59   Dg Chest Portable 1 View  Result Date: 08/31/2019 CLINICAL DATA:  Shortness of breath EXAM: PORTABLE CHEST 1 VIEW COMPARISON:  01/07/2018 FINDINGS: Heart is upper limits normal in size. Patchy bilateral airspace opacities are noted concerning for multifocal pneumonia. No  effusion or pneumothorax. No acute bony abnormality. IMPRESSION: Patchy bilateral airspace opacities concerning for pneumonia. Electronically Signed   By: Charlett Nose M.D.   On: 08/31/2019 22:01    Pending Labs Unresulted Labs (From admission, onward)    Start     Ordered   08/31/19 2244  Blood Culture (routine x 2)  BLOOD CULTURE X 2,   STAT     08/31/19 2245   08/31/19 2244  Procalcitonin  ONCE - STAT,   STAT     08/31/19 2245   08/31/19 2137  Urinalysis, Routine w reflex microscopic  ONCE - STAT,   STAT     08/31/19 2140   Signed and Held  Influenza panel by PCR (type A & B)  Add-on,   R     Signed and Held   Signed and Held  Respiratory Panel by PCR  (Respiratory virus panel with precautions)  Add-on,   R     Signed and Held   Signed and Held  Magnesium  Tomorrow morning,   R     Signed and Held   Signed and Held  HIV antibody (Routine Testing)  Once,   R     Signed and Held   Medical illustrator and Held  ABO/Rh  Once,   R     Signed and Held   Signed and Held  Type and screen [name]  Once,   R    Comments: [name]    Signed and Held   Signed and Held  Hepatitis B surface antigen  Once,   R     Signed and Held   Signed and Held  CBC with Differential/Platelet  Daily,   R     Signed and Held   Signed and Held  Comprehensive metabolic panel  Daily,   R     Signed and Held   Signed and Held  C-reactive protein  Daily,   R     Signed and Held   Signed and Held  D-dimer, quantitative (not at [facility])  Daily,   R     Signed and Held   Signed and Held  Ferritin  Daily,   R     Signed and Held   Signed and Held  Magnesium  Daily,   R     Signed and Held          Vitals/Pain Today's Vitals   09/01/19 0215 09/01/19 0220 09/01/19 0230 09/01/19 0237  BP:      Pulse: 82  78   Resp: 19  (!) 23   Temp:      TempSrc:      SpO2: 96%  94%   Weight:      Height:      PainSc:  9   9     Isolation Precautions Airborne and Contact  precautions  Medications Medications  potassium chloride 10 mEq in 100 mL IVPB (10 mEq Intravenous New Bag/Given 09/01/19 0217)  oxyCODONE-acetaminophen (PERCOCET/ROXICET) 5-325 MG per tablet 1 tablet (1 tablet Oral Given 09/01/19 0219)  morphine 2 MG/ML injection 2 mg (2 mg Intravenous Given 09/01/19 0123)  potassium chloride SA (K-DUR) CR tablet 40 mEq (has no administration in time range)  magnesium sulfate IVPB 1 g 100 mL (has no administration in time range)  ondansetron (ZOFRAN) injection 4 mg (4 mg Intravenous Given 08/31/19 2319)  morphine 2 MG/ML injection 2 mg (2 mg Intravenous Given 08/31/19 2319)  potassium chloride SA (K-DUR) CR tablet 40 mEq (40 mEq Oral Given 08/31/19 2346)  iohexol (OMNIPAQUE) 350 MG/ML injection 100 mL (100 mLs Intravenous Contrast Given 09/01/19 0044)    Mobility walks Low fall risk   Focused Assessments Pulmonary Assessment Handoff:  Lung sounds: Bilateral Breath Sounds: Fine crackles L Breath Sounds: Fine crackles R Breath Sounds: Fine crackles O2 Device: Nasal Cannula O2 Flow Rate (L/min): 4 L/min      R Recommendations: See Admitting Provider Note  Report given to:   Additional Notes:

## 2019-09-01 NOTE — ED Notes (Signed)
[facility] to transport patient to [facility]

## 2019-09-01 NOTE — Progress Notes (Signed)
Notified mother of progress.  All questions were answered and this nurse's contact number shared for further communication.   

## 2019-09-01 NOTE — ED Notes (Signed)
Debra (SR)-Lunch Tray Ordered @ 1020. 

## 2019-09-01 NOTE — ED Notes (Signed)
Verbal report given to mindy at [facility]

## 2019-09-01 NOTE — Progress Notes (Addendum)
Emma Gould, is a 63 y.o. female, DOB - 1956/09/15, ZGY:[id number]  Patient admitted earlier today evaluated her over the computer and talk to her over the phone.  She is getting more short of breath and hypoxic.  Have requested pharmacy to dispense Solu-Medrol and Remdisvir right away.  She is also consented for Actemra which will be used if hypoxia gets any worse.     Actemra off label use - patient was told that if COVID-19 pneumonitis gets worse we might potentially use Actemra off label, she denies any known history of tuberculosis or hepatitis, understands the risks and benefits and wants to proceed with Actemra treatment if required.   Addendum - Now on 5lit [place] Pox 87% - came on RA last night, rapidly declining - start Actemra   Vitals:   09/01/19 0800 09/01/19 0900 09/01/19 0959 09/01/19 1002  BP: (!) 160/78 (!) 165/83 (!) 165/83 (!) 191/84  Pulse: 86 78 90 88  Resp: 20 (!) 22 20 20   Temp:    98.9 F (37.2 C)  TempSrc:    Oral  SpO2: (!) 88% (!) 87% (!) 87% (!) 87% - 5lit/[place]  Weight:      Height:            Data Review   Micro Results Recent Results (from the past 240 hour(s))  SARS Coronavirus 2 [facility] order, Performed in [facility] hospital lab) Nasopharyngeal Nasopharyngeal Swab     Status: Abnormal   Collection Time: 08/31/19  9:51 PM   Specimen: Nasopharyngeal Swab  Result Value Ref Range Status   SARS Coronavirus 2 POSITIVE (A) NEGATIVE Final    Comment: RESULT CALLED TO, READ BACK BY AND VERIFIED WITH: Rockne Coons RN 08/31/19 2331 JDW (NOTE) If result is NEGATIVE SARS-CoV-2 target nucleic acids are NOT DETECTED. The SARS-CoV-2 RNA is generally detectable in upper and lower  respiratory specimens during the acute phase of infection. The lowest  concentration of SARS-CoV-2 viral copies this assay can detect is 250  copies / mL. A negative result does not preclude SARS-CoV-2  infection  and should not be used as the sole basis for treatment or other  patient management decisions.  A negative result may occur with  improper specimen collection / handling, submission of specimen other  than nasopharyngeal swab, presence of viral mutation(s) within the  areas targeted by this assay, and inadequate number of viral copies  (<250 copies / mL). A negative result must be combined with clinical  observations, patient history, and epidemiological information. If result is POSITIVE SARS-CoV-2 target nucleic acids are DETECTED. The S ARS-CoV-2 RNA is generally detectable in upper and lower  respiratory specimens during the acute phase of infection.  Positive  results are indicative of active infection with SARS-CoV-2.  Clinical  correlation with patient history and other diagnostic information is  necessary to determine patient infection status.  Positive results do  not rule out bacterial infection or co-infection with other viruses. If result is PRESUMPTIVE POSTIVE SARS-CoV-2 nucleic acids MAY BE PRESENT.   A presumptive positive result was obtained on the submitted specimen  and confirmed on repeat testing.  While 2019 novel coronavirus  (SARS-CoV-2) nucleic acids may be present in the submitted sample  additional confirmatory testing may be necessary for epidemiological  and / or clinical management purposes  to differentiate between  SARS-CoV-2 and other Sarbecovirus currently known to infect humans.  If clinically indicated additional testing with an alternate test  methodology [phone]) is adv ised.  The SARS-CoV-2 RNA is generally  detectable in upper and lower respiratory specimens during the acute  phase of infection. The expected result is Negative. Fact Sheet for Patients:  BoilerBrush.com.cyhttps://www.fda.gov/media/136312/download Fact Sheet for Healthcare Providers: https://pope.com/https://www.fda.gov/media/136313/download This test is not yet approved or cleared by the [place]  FDA and has been authorized for detection and/or diagnosis of SARS-CoV-2 by FDA under an Emergency Use Authorization (EUA).  This EUA will remain in effect (meaning this test can be used) for the duration of the COVID-19 declaration under Section 564(b)(1) of the Act, 21 U.S.C. section 360bbb-3(b)(1), unless the authorization is terminated or revoked sooner. Performed at [facility], [address] [place], [place] [address]     Radiology Reports Ct Angio Chest Pe W And/or Wo Contrast  Result Date: 09/01/2019 CLINICAL DATA:  Abdominal pain, O2 saturation 88% on room air EXAM: CT ANGIOGRAPHY CHEST WITH CONTRAST TECHNIQUE: Multidetector CT imaging of the chest was performed using the standard protocol during bolus administration of intravenous contrast. Multiplanar CT image reconstructions and MIPs were obtained to evaluate the vascular anatomy. CONTRAST:  100mL OMNIPAQUE IOHEXOL 350 MG/ML SOLN COMPARISON:  None. FINDINGS: Cardiovascular: There is a optimal opacification of the pulmonary arteries. There is no central,segmental, or subsegmental filling defects within the pulmonary arteries. The heart is normal in size. No pericardial effusion thickening. No evidence right heart strain. There is normal three-vessel brachiocephalic anatomy without proximal stenosis. The thoracic aorta is normal in appearance. Mediastinum/Nodes: No hilar, mediastinal, or axillary adenopathy. Thyroid gland, trachea, and esophagus demonstrate no significant findings. Lungs/Pleura: There is dense of patchy airspace opacities seen within the periphery and predominantly within the upper lobes. No pleural effusion is seen. Upper Abdomen: No acute abnormalities present in the visualized portions of the upper abdomen. Musculoskeletal: No chest wall abnormality. No acute or significant osseous findings. Review of the MIP images confirms the above findings. IMPRESSION: Patchy airspace opacities throughout both lungs. There are  a spectrum of findings in the lungs which can be seen with acute atypical infection (as well as other non-infectious etiologies). In particular, viral pneumonia (including COVID-19) should be considered in the appropriate clinical setting. No central, segmental, or subsegmental pulmonary embolism. Electronically Signed   By: Jonna ClarkBindu  Avutu M.D.   On: 09/01/2019 00:59   Dg Chest Portable 1 View  Result Date: 08/31/2019 CLINICAL DATA:  Shortness of breath EXAM: PORTABLE CHEST 1 VIEW COMPARISON:  01/07/2018 FINDINGS: Heart is upper limits normal in size. Patchy bilateral airspace opacities are noted concerning for multifocal pneumonia. No effusion or pneumothorax. No acute bony abnormality. IMPRESSION: Patchy bilateral airspace opacities concerning for pneumonia. Electronically Signed   By: Charlett NoseKevin  Dover M.D.   On: 08/31/2019 22:01    CBC Recent Labs  Lab 08/31/19 2154 09/01/19 0811  WBC 10.9* 9.4  HGB 14.3 14.7  HCT 42.3 44.7  PLT 505* 439*  MCV 85.1 86.6  MCH 28.8 28.5  MCHC 33.8 32.9  RDW 14.6 14.9  LYMPHSABS 1.4  --   MONOABS 0.5  --   EOSABS 0.0  --   BASOSABS 0.0  --     Chemistries  Recent Labs  Lab 08/31/19 2154 09/01/19 0811  NA 139 140  K 2.3* 3.5  CL 100 104  CO2 25 24  GLUCOSE 107* 86  BUN 13 11  CREATININE 0.89 0.78  CALCIUM 8.9 9.0  MG  --  2.6*  AST 33 27  ALT 21 19  ALKPHOS 101 87  BILITOT 0.7 0.6   ------------------------------------------------------------------------------------------------------------------  estimated creatinine clearance is 68.3 mL/min (by C-G formula based on SCr of 0.78 mg/dL). ------------------------------------------------------------------------------------------------------------------ No results for input(s): HGBA1C in the last 72 hours. ------------------------------------------------------------------------------------------------------------------ Recent Labs    08/31/19 0028  TRIG 140    ------------------------------------------------------------------------------------------------------------------ No results for input(s): TSH, T4TOTAL, T3FREE, THYROIDAB in the last 72 hours.  Invalid input(s): FREET3 ------------------------------------------------------------------------------------------------------------------ Recent Labs    08/31/19 2310 09/01/19 0811  FERRITIN 190 219    Coagulation profile No results for input(s): INR, PROTIME in the last 168 hours.  Recent Labs    08/31/19 2310  DDIMER 1.50*    Cardiac Enzymes No results for input(s): CKMB, TROPONINI, MYOGLOBIN in the last 168 hours.  Invalid input(s): CK ------------------------------------------------------------------------------------------------------------------ Invalid input(s): POCBNP

## 2019-09-01 NOTE — ED Notes (Signed)
Breakfast ordered 

## 2019-09-01 NOTE — Plan of Care (Signed)

## 2019-09-02 DIAGNOSIS — J069 Acute upper respiratory infection, unspecified: Secondary | ICD-10-CM

## 2019-09-02 DIAGNOSIS — U071 COVID-19: Principal | ICD-10-CM

## 2019-09-02 LAB — FERRITIN: Ferritin: 147 ng/mL (ref 11–307)

## 2019-09-02 LAB — CBC WITH DIFFERENTIAL/PLATELET
Abs Immature Granulocytes: 0.33 10*3/uL — ABNORMAL HIGH (ref 0.00–0.07)
Basophils Absolute: 0 10*3/uL (ref 0.0–0.1)
Basophils Relative: 1 %
Eosinophils Absolute: 0 10*3/uL (ref 0.0–0.5)
Eosinophils Relative: 0 %
HCT: 39.9 % (ref 36.0–46.0)
Hemoglobin: 12.9 g/dL (ref 12.0–15.0)
Immature Granulocytes: 5 %
Lymphocytes Relative: 15 %
Lymphs Abs: 1.1 10*3/uL (ref 0.7–4.0)
MCH: 28.2 pg (ref 26.0–34.0)
MCHC: 32.3 g/dL (ref 30.0–36.0)
MCV: 87.1 fL (ref 80.0–100.0)
Monocytes Absolute: 0.1 10*3/uL (ref 0.1–1.0)
Monocytes Relative: 2 %
Neutro Abs: 5.7 10*3/uL (ref 1.7–7.7)
Neutrophils Relative %: 77 %
Platelets: 415 10*3/uL — ABNORMAL HIGH (ref 150–400)
RBC: 4.58 MIL/uL (ref 3.87–5.11)
RDW: 15.2 % (ref 11.5–15.5)
WBC: 7.2 10*3/uL (ref 4.0–10.5)
nRBC: 0 % (ref 0.0–0.2)

## 2019-09-02 LAB — D-DIMER, QUANTITATIVE: D-Dimer, Quant: 1.43 ug/mL-FEU — ABNORMAL HIGH (ref 0.00–0.50)

## 2019-09-02 LAB — C-REACTIVE PROTEIN: CRP: 11.6 mg/dL — ABNORMAL HIGH (ref <1.0)

## 2019-09-02 LAB — COMPREHENSIVE METABOLIC PANEL
ALT: 16 U/L (ref 0–44)
AST: 20 U/L (ref 15–41)
Albumin: 2.5 g/dL — ABNORMAL LOW (ref 3.5–5.0)
Alkaline Phosphatase: 89 U/L (ref 38–126)
Anion gap: 12 (ref 5–15)
BUN: 16 mg/dL (ref 8–23)
CO2: 22 mmol/L (ref 22–32)
Calcium: 9 mg/dL (ref 8.9–10.3)
Chloride: 106 mmol/L (ref 98–111)
Creatinine, Ser: 0.72 mg/dL (ref 0.44–1.00)
GFR calc Af Amer: >60 mL/min (ref >60)
GFR calc non Af Amer: >60 mL/min (ref >60)
Glucose, Bld: 120 mg/dL — ABNORMAL HIGH (ref 70–99)
Potassium: 4.7 mmol/L (ref 3.5–5.1)
Sodium: 140 mmol/L (ref 135–145)
Total Bilirubin: 0.4 mg/dL (ref 0.3–1.2)
Total Protein: 6.8 g/dL (ref 6.5–8.1)

## 2019-09-02 LAB — MAGNESIUM: Magnesium: 2.1 mg/dL (ref 1.7–2.4)

## 2019-09-02 LAB — BRAIN NATRIURETIC PEPTIDE: B Natriuretic Peptide: 155.5 pg/mL — ABNORMAL HIGH (ref 0.0–100.0)

## 2019-09-02 LAB — HIV ANTIBODY (ROUTINE TESTING W REFLEX): HIV Screen 4th Generation wRfx: NONREACTIVE

## 2019-09-02 LAB — PHOSPHORUS: Phosphorus: 3.8 mg/dL (ref 2.5–4.6)

## 2019-09-02 MED ORDER — ENSURE ENLIVE PO LIQD
237.0000 mL | Freq: Two times a day (BID) | ORAL | Status: DC
  Administered 2019-09-02 – 2019-09-05 (×4): 237 mL via ORAL

## 2019-09-02 MED ORDER — GABAPENTIN 300 MG PO CAPS
400.0000 mg | ORAL_CAPSULE | Freq: Three times a day (TID) | ORAL | Status: DC
  Administered 2019-09-02 – 2019-09-05 (×10): 400 mg via ORAL
  Filled 2019-09-02 (×10): qty 1

## 2019-09-02 MED ORDER — ADULT MULTIVITAMIN W/MINERALS CH
1.0000 | ORAL_TABLET | Freq: Every day | ORAL | Status: DC
  Administered 2019-09-02 – 2019-09-05 (×4): 1 via ORAL
  Filled 2019-09-02 (×4): qty 1

## 2019-09-02 MED ORDER — CLONAZEPAM 0.5 MG PO TBDP
1.0000 mg | ORAL_TABLET | Freq: Two times a day (BID) | ORAL | Status: DC
  Administered 2019-09-02 – 2019-09-05 (×7): 1 mg via ORAL
  Filled 2019-09-02 (×7): qty 2

## 2019-09-02 MED ORDER — METHYLPREDNISOLONE SODIUM SUCC 40 MG IJ SOLR
40.0000 mg | Freq: Two times a day (BID) | INTRAMUSCULAR | Status: DC
  Administered 2019-09-02 – 2019-09-03 (×3): 40 mg via INTRAVENOUS
  Filled 2019-09-02 (×3): qty 1

## 2019-09-02 NOTE — Progress Notes (Signed)
Patient able to prone most all night, slept very well and sats remain 94% on 4L HFNC

## 2019-09-02 NOTE — Progress Notes (Signed)
PROGRESS NOTE                                                                                                                                                                                                             Patient Demographics:    Emma Gould, is a 63 y.o. female, DOB - 1956-08-15, PXT:[id number]  Outpatient Primary MD for the patient is System, Pcp Not In    LOS - 1  Admit date - 08/31/2019    Chief Complaint  Patient presents with  . Abdominal Pain    HA x 4 days; HTN       Brief Narrative  63 year old woman PMH of hypertension, GERD, fibromyalgia, migraine headaches presented with fever, chills, abdominal pain and diarrhea.  Found to be hypoxic, COVID-19 positive with infiltrates on chest x-ray and CT angiogram.  Admitted for acute hypoxic respiratory failure secondary COVID-19 pneumonia, abdominal pain, diarrhea with marked hypokalemia.   Subjective:    Emma Gould today has, No headache, No chest pain, No abdominal pain - No Nausea, No new weakness tingling or numbness, improved Cough & SOB.     Assessment  & Plan :     1. Acute Hypoxic Resp. Failure due to Acute Covid 19 Viral Pneumonitis during the ongoing 2020 Covid 19 Pandemic - she was started on combination of IV steroids, Remdisvir and Actemra.  She had consented for Actemra.  She has clinically improved and now down to 2 L nasal cannula oxygen from 5.  We will continue to monitor clinically and inflammatory markers.  Encouraged to sit up in chair in the daytime use I-S and flutter valve for pulmonary toiletry and prone bed at night.  COVID-19 Labs  Recent Labs    08/31/19 2310 09/01/19 0811 09/02/19 0251  DDIMER 1.50*  --  1.43*  FERRITIN 190 219 147  LDH 385* 363*  --   CRP 6.6* 7.7* 11.6*    Lab Results  Component Value Date   SARSCOV2NAA POSITIVE (A) 08/31/2019     Hepatic Function Latest Ref Rng & Units 09/02/2019 09/01/2019  08/31/2019  Total Protein 6.5 - 8.1 g/dL 6.8 7.1 7.8  Albumin 3.5 - 5.0 g/dL 2.5(L) 2.6(L) 2.9(L)  AST 15 - 41 U/L 20 27 33  ALT 0 - 44 U/L _0 Alk Phosphatase 38 - 126 U/L  89 87 101  Total Bilirubin 0.3 - 1.2 mg/dL 0.4 0.6 0.7        Component Value Date/Time   BNP 155.5 (H) 09/02/2019 0251      2.  Severe hypokalemia.  Replaced and now stable we will continue to monitor.  3.  Generalized abdominal pain with gastroenteritis due to COVID-19.  Improved with supportive care monitor.  4.  Borderline prolonged QTC due to low potassium.  Stabilized after potassium replacement.  5.  Poorly controlled essential hypertension.  Placed on Norvasc, propranolol, currently stable monitor with PRN hydralazine.  Catapres on hold.  6.  Severe generalized anxiety.  On Lexapro, Neurontin, low-dose Klonopin added.  May benefit from outpatient psych follow-up.  7.  Smoking.  Counseled to quit.  NicoDerm patch.  8.  GERD.  Continue PPI.     Condition - Extremely Guarded  Family Communication  :  None  Code Status :  Full  Diet :   Diet Order            Diet Heart Room service appropriate? Yes; Fluid consistency: Thin  Diet effective now               Disposition Plan  :  Home 3-4 days  Consults  :  None  Procedures  :     PUD Prophylaxis :  PPI  DVT Prophylaxis  :  Lovenox    Lab Results  Component Value Date   PLT 415 (H) 09/02/2019    Inpatient Medications  Scheduled Meds: . amLODipine  10 mg Oral Daily  . enoxaparin (LOVENOX) injection  40 mg Subcutaneous Q24H  . escitalopram  20 mg Oral Daily  . feeding supplement (ENSURE ENLIVE)  237 mL Oral BID BM  . gabapentin  600 mg Oral TID  . ipratropium  2 puff Inhalation Q4H  . methylPREDNISolone (SOLU-MEDROL) injection  60 mg Intravenous Q12H  . multivitamin with minerals  1 tablet Oral Daily  . nicotine  21 mg Transdermal Daily  . pantoprazole  40 mg Oral Daily  . propranolol ER  80 mg Oral Daily  .  QUEtiapine  25 mg Oral QHS  . vitamin C  500 mg Oral Daily  . zinc sulfate  220 mg Oral Daily   Continuous Infusions: . lactated ringers with kcl 75 mL/hr at 09/01/19 1652  . remdesivir 100 mg in NS 250 mL     PRN Meds:.acetaminophen, albuterol, cyclobenzaprine, dextromethorphan-guaiFENesin, hydrALAZINE, [DISCONTINUED] ondansetron **OR** ondansetron (ZOFRAN) IV, SUMAtriptan  Antibiotics  :    Anti-infectives (From admission, onward)   Start     Dose/Rate Route Frequency Ordered Stop   09/02/19 1200  remdesivir 100 mg in sodium chloride 0.9 % 250 mL IVPB     100 mg 500 mL/hr over 30 Minutes Intravenous Every 24 hours 09/01/19 1038 09/06/19 1159   09/01/19 1200  remdesivir 200 mg in sodium chloride 0.9 % 250 mL IVPB     200 mg 500 mL/hr over 30 Minutes Intravenous Once 09/01/19 1038 09/01/19 1141       Time Spent in minutes  30   Lala Lund M.D on 09/02/2019 at 10:03 AM  To page go to [email or web address] - password [facility]  [facility] -  Office  [phone]   See all Orders from today for further details    Objective:   Vitals:   09/01/19 2022 09/02/19 0445 09/02/19 0600 09/02/19 0758  BP: 114/61 113/71 (!) 110/92 (!) 118/56  Pulse:   60 79  Resp: _0 Temp: 98.1 F (36.7 C) 97.7 F (36.5 C)  (!) 97.1 F (36.2 C)  TempSrc: Oral Oral  Axillary  SpO2: 90%  91% (!) 87%  Weight:      Height:        Wt Readings from Last 3 Encounters:  08/31/19 68 kg     Intake/Output Summary (Last 24 hours) at 09/02/2019 1003 Last data filed at 09/02/2019 0300 Gross per 24 hour  Intake 821.15 ml  Output -  Net 821.15 ml     Physical Exam  Awake Alert, Oriented X 3, No new F.N deficits, anxious affect [place].AT,PERRAL Supple Neck,No JVD, No cervical lymphadenopathy appriciated.  Symmetrical Chest wall movement, Good air movement bilaterally, CTAB RRR,No Gallops,Rubs or new Murmurs, No Parasternal Heave +ve B.Sounds, Abd Soft, No tenderness, No organomegaly  appriciated, No rebound - guarding or rigidity. No Cyanosis, Clubbing or edema, No new Rash or bruise       Data Review:    CBC Recent Labs  Lab 08/31/19 2154 09/01/19 0811 09/02/19 0251  WBC 10.9* 9.4 7.2  HGB 14.3 14.7 12.9  HCT 42.3 44.7 39.9  PLT 505* 439* 415*  MCV 85.1 86.6 87.1  MCH 28.8 28.5 28.2  MCHC 33.8 32.9 32.3  RDW 14.6 14.9 15.2  LYMPHSABS 1.4  --  1.1  MONOABS 0.5  --  0.1  EOSABS 0.0  --  0.0  BASOSABS 0.0  --  0.0    Chemistries  Recent Labs  Lab 08/31/19 2154 09/01/19 0811 09/01/19 1500 09/02/19 0251  NA 139 140 140 140  K 2.3* 3.5 4.1 4.7  CL 100 104 107 106  CO2 _1 GLUCOSE 107* 86 133* 120*  BUN _2 CREATININE 0.89 0.78 0.77 0.72  CALCIUM 8.9 9.0 8.5* 9.0  MG  --  2.6*  --  2.1  AST 33 27  --  20  ALT 21 19  --  16  ALKPHOS 101 87  --  89  BILITOT 0.7 0.6  --  0.4   ------------------------------------------------------------------------------------------------------------------ Recent Labs    08/31/19 0028  TRIG 140    No results found for: HGBA1C ------------------------------------------------------------------------------------------------------------------ No results for input(s): TSH, T4TOTAL, T3FREE, THYROIDAB in the last 72 hours.  Invalid input(s): FREET3  Cardiac Enzymes No results for input(s): CKMB, TROPONINI, MYOGLOBIN in the last 168 hours.  Invalid input(s): CK ------------------------------------------------------------------------------------------------------------------    Component Value Date/Time   BNP 155.5 (H) 09/02/2019 0251    Micro Results Recent Results (from the past 240 hour(s))  SARS Coronavirus 2 [facility] order, Performed in [facility] hospital lab) Nasopharyngeal Nasopharyngeal Swab     Status: Abnormal   Collection Time: 08/31/19  9:51 PM   Specimen: Nasopharyngeal Swab  Result Value Ref Range Status   SARS Coronavirus 2 POSITIVE (A) NEGATIVE Final    Comment:  RESULT CALLED TO, READ BACK BY AND VERIFIED WITH: Rockne Coons RN 08/31/19 2331 JDW (NOTE) If result is NEGATIVE SARS-CoV-2 target nucleic acids are NOT DETECTED. The SARS-CoV-2 RNA is generally detectable in upper and lower  respiratory specimens during the acute phase of infection. The lowest  concentration of SARS-CoV-2 viral copies this assay can detect is 250  copies / mL. A negative result does not preclude SARS-CoV-2 infection  and should not be used as the sole basis for treatment or other  patient management decisions.  A negative result may occur with  improper specimen collection / handling,  submission of specimen other  than nasopharyngeal swab, presence of viral mutation(s) within the  areas targeted by this assay, and inadequate number of viral copies  (<250 copies / mL). A negative result must be combined with clinical  observations, patient history, and epidemiological information. If result is POSITIVE SARS-CoV-2 target nucleic acids are DETECTED. The S ARS-CoV-2 RNA is generally detectable in upper and lower  respiratory specimens during the acute phase of infection.  Positive  results are indicative of active infection with SARS-CoV-2.  Clinical  correlation with patient history and other diagnostic information is  necessary to determine patient infection status.  Positive results do  not rule out bacterial infection or co-infection with other viruses. If result is PRESUMPTIVE POSTIVE SARS-CoV-2 nucleic acids MAY BE PRESENT.   A presumptive positive result was obtained on the submitted specimen  and confirmed on repeat testing.  While 2019 novel coronavirus  (SARS-CoV-2) nucleic acids may be present in the submitted sample  additional confirmatory testing may be necessary for epidemiological  and / or clinical management purposes  to differentiate between  SARS-CoV-2 and other Sarbecovirus currently known to infect humans.  If clinically indicated additional testing  with an alternate test  methodology [phone]) is adv ised. The SARS-CoV-2 RNA is generally  detectable in upper and lower respiratory specimens during the acute  phase of infection. The expected result is Negative. Fact Sheet for Patients:  StrictlyIdeas.no Fact Sheet for Healthcare Providers: BankingDealers.co.za This test is not yet approved or cleared by the [place] FDA and has been authorized for detection and/or diagnosis of SARS-CoV-2 by FDA under an Emergency Use Authorization (EUA).  This EUA will remain in effect (meaning this test can be used) for the duration of the COVID-19 declaration under Section 564(b)(1) of the Act, 21 U.S.C. section 360bbb-3(b)(1), unless the authorization is terminated or revoked sooner. Performed at [facility], [place] [address] [place], [place] [address]     Radiology Reports Ct Angio Chest Pe W And/or Wo Contrast  Result Date: 09/01/2019 CLINICAL DATA:  Abdominal pain, O2 saturation 88% on room air EXAM: CT ANGIOGRAPHY CHEST WITH CONTRAST TECHNIQUE: Multidetector CT imaging of the chest was performed using the standard protocol during bolus administration of intravenous contrast. Multiplanar CT image reconstructions and MIPs were obtained to evaluate the vascular anatomy. CONTRAST:  136m OMNIPAQUE IOHEXOL 350 MG/ML SOLN COMPARISON:  None. FINDINGS: Cardiovascular: There is a optimal opacification of the pulmonary arteries. There is no central,segmental, or subsegmental filling defects within the pulmonary arteries. The heart is normal in size. No pericardial effusion thickening. No evidence right heart strain. There is normal three-vessel brachiocephalic anatomy without proximal stenosis. The thoracic aorta is normal in appearance. Mediastinum/Nodes: No hilar, mediastinal, or axillary adenopathy. Thyroid gland, trachea, and esophagus demonstrate no significant findings. Lungs/Pleura: There is  dense of patchy airspace opacities seen within the periphery and predominantly within the upper lobes. No pleural effusion is seen. Upper Abdomen: No acute abnormalities present in the visualized portions of the upper abdomen. Musculoskeletal: No chest wall abnormality. No acute or significant osseous findings. Review of the MIP images confirms the above findings. IMPRESSION: Patchy airspace opacities throughout both lungs. There are a spectrum of findings in the lungs which can be seen with acute atypical infection (as well as other non-infectious etiologies). In particular, viral pneumonia (including COVID-19) should be considered in the appropriate clinical setting. No central, segmental, or subsegmental pulmonary embolism. Electronically Signed   By: BEbony CargoD.  On: 09/01/2019 00:59   Dg Chest Portable 1 View  Result Date: 08/31/2019 CLINICAL DATA:  Shortness of breath EXAM: PORTABLE CHEST 1 VIEW COMPARISON:  01/07/2018 FINDINGS: Heart is upper limits normal in size. Patchy bilateral airspace opacities are noted concerning for multifocal pneumonia. No effusion or pneumothorax. No acute bony abnormality. IMPRESSION: Patchy bilateral airspace opacities concerning for pneumonia. Electronically Signed   By: Rolm Baptise M.D.   On: 08/31/2019 22:01

## 2019-09-02 NOTE — Progress Notes (Signed)
Initial Nutrition Assessment  RD working remotely.   DOCUMENTATION CODES:   (unable to assess for malnutrition at this time.)  INTERVENTION:  - will order Ensure Enlive po BID, each supplement provides 350 kcal and 20 grams of protein - continue supplements per protocol: Hormel Shake once/day (500 kcal, 22 grams protein) and Magic Cup BID (290 kcal, 9 grams protein each). - continue to encourage PO intakes.    NUTRITION DIAGNOSIS:   Increased nutrient needs related to acute illness as evidenced by estimated needs(COVID-19).  GOAL:   Patient will meet greater than or equal to 90% of their needs  MONITOR:   PO intake, Supplement acceptance, Labs, Weight trends  REASON FOR ASSESSMENT:   Malnutrition Screening Tool  ASSESSMENT:   63 y.o. female with medical history significant of HTN, GERD, depression, migraine headache, tobacco abuse, and fibromyalgia. She presented to the ED from home via EMS on 9/5 with fever, chills, shortness of breath, cough, abdominal pain, and diarrhea (up to 5 loose BMs/day) for ~1 week. She stated that she has BLE pain which she suspected is due to fibromyalgia. She was found to be COVID-19 positive in the ED. CXR showed bilateral patchy infiltrates, CT negative for PE.  No intakes documented since admission. Per chart review, current weight documented as 150 lb and weight on 8/25 at [facility] documented as 155 lb. This indicates 5 lb weight loss (3.2% body weight) in ~2 weeks; significant for time frame. Patient is at high risk for malnutrition.  She reports that for the past week she has felt progressively worse.symptoms have progressed. She denies every vomiting but was experiencing intermittent nausea and near constant abdominal pain and head ache for the past 4-5 days. She reports that d/t symptoms she has had a hard time eating and drinking d/t lack of appetite, lack of interest, and decreased energy.   Per notes: - acute hypoxic respiratory failure  2/2 COVID-19 PNA--plan for proning as able (was proned most of the night from 9/5-9/6) - hypokalemia 2/2 profuse diarrhea--currently resolved after repletion - generalized abdominal pain - prolonged QT   Labs reviewed. Medications reviewed; 20 mEq KDur x1 dose 9/5, 200 mg remdesivir x1 dose 9/5, 100 mg remdesivir x1 dose/day for 4 days, 544 mg actemra x1 dose 9/5, 500 mg ascorbic acid/day, 220 mg zinc sulfate/day.  IVF; LR-10 mEq IV KCl @ 75 ml/hr.   NUTRITION - FOCUSED PHYSICAL EXAM:  unable to complete while patient is at [facility]  Diet Order:   Diet Order            Diet Heart Room service appropriate? Yes; Fluid consistency: Thin  Diet effective now              EDUCATION NEEDS:   No education needs have been identified at this time  Skin:  Skin Assessment: Reviewed RN Assessment  Last BM:  PTA/unknown  Height:   Ht Readings from Last 1 Encounters:  08/31/19 5\' 3"  (1.6 m)    Weight:   Wt Readings from Last 1 Encounters:  08/31/19 68 kg    Ideal Body Weight:  52.3 kg  BMI:  Body mass index is 26.57 kg/m.  Estimated Nutritional Needs:   Kcal:  9024-0973 kcal  Protein:  100-110 grams  Fluid:  >/= 2 L/day     Jarome Matin, MS, RD, LDN, [facility] Inpatient Clinical Dietitian Pager # [phone] After hours/weekend pager # [phone]

## 2019-09-03 ENCOUNTER — Inpatient Hospital Stay (HOSPITAL_COMMUNITY): Payer: Medicare Other

## 2019-09-03 LAB — BRAIN NATRIURETIC PEPTIDE: B Natriuretic Peptide: 264.6 pg/mL — ABNORMAL HIGH (ref 0.0–100.0)

## 2019-09-03 LAB — CBC WITH DIFFERENTIAL/PLATELET
Abs Immature Granulocytes: 0.32 10*3/uL — ABNORMAL HIGH (ref 0.00–0.07)
Basophils Absolute: 0.1 10*3/uL (ref 0.0–0.1)
Basophils Relative: 0 %
Eosinophils Absolute: 0 10*3/uL (ref 0.0–0.5)
Eosinophils Relative: 0 %
HCT: 37.8 % (ref 36.0–46.0)
Hemoglobin: 12.6 g/dL (ref 12.0–15.0)
Immature Granulocytes: 3 %
Lymphocytes Relative: 9 %
Lymphs Abs: 1 10*3/uL (ref 0.7–4.0)
MCH: 29.2 pg (ref 26.0–34.0)
MCHC: 33.3 g/dL (ref 30.0–36.0)
MCV: 87.5 fL (ref 80.0–100.0)
Monocytes Absolute: 0.6 10*3/uL (ref 0.1–1.0)
Monocytes Relative: 5 %
Neutro Abs: 10.1 10*3/uL — ABNORMAL HIGH (ref 1.7–7.7)
Neutrophils Relative %: 83 %
Platelets: 479 10*3/uL — ABNORMAL HIGH (ref 150–400)
RBC: 4.32 MIL/uL (ref 3.87–5.11)
RDW: 15.6 % — ABNORMAL HIGH (ref 11.5–15.5)
WBC: 12 10*3/uL — ABNORMAL HIGH (ref 4.0–10.5)
nRBC: 0 % (ref 0.0–0.2)

## 2019-09-03 LAB — COMPREHENSIVE METABOLIC PANEL
ALT: 13 U/L (ref 0–44)
AST: 16 U/L (ref 15–41)
Albumin: 2.3 g/dL — ABNORMAL LOW (ref 3.5–5.0)
Alkaline Phosphatase: 72 U/L (ref 38–126)
Anion gap: 12 (ref 5–15)
BUN: 23 mg/dL (ref 8–23)
CO2: 20 mmol/L — ABNORMAL LOW (ref 22–32)
Calcium: 8.7 mg/dL — ABNORMAL LOW (ref 8.9–10.3)
Chloride: 109 mmol/L (ref 98–111)
Creatinine, Ser: 0.75 mg/dL (ref 0.44–1.00)
GFR calc Af Amer: >60 mL/min (ref >60)
GFR calc non Af Amer: >60 mL/min (ref >60)
Glucose, Bld: 121 mg/dL — ABNORMAL HIGH (ref 70–99)
Potassium: 4.7 mmol/L (ref 3.5–5.1)
Sodium: 141 mmol/L (ref 135–145)
Total Bilirubin: 0.1 mg/dL — ABNORMAL LOW (ref 0.3–1.2)
Total Protein: 6.1 g/dL — ABNORMAL LOW (ref 6.5–8.1)

## 2019-09-03 LAB — C-REACTIVE PROTEIN: CRP: 5 mg/dL — ABNORMAL HIGH (ref <1.0)

## 2019-09-03 LAB — MAGNESIUM: Magnesium: 2.2 mg/dL (ref 1.7–2.4)

## 2019-09-03 LAB — ABO/RH: ABO/RH(D): A POS

## 2019-09-03 LAB — FERRITIN: Ferritin: 152 ng/mL (ref 11–307)

## 2019-09-03 LAB — D-DIMER, QUANTITATIVE: D-Dimer, Quant: 1.51 ug/mL-FEU — ABNORMAL HIGH (ref 0.00–0.50)

## 2019-09-03 NOTE — Evaluation (Signed)
Physical Therapy Evaluation Patient Details Name: Emma Gould MRN: [id number] DOB: 06/08/1956 Today's Date: 09/03/2019   History of Present Illness  63 y.o. female with medical history significant of hypertension, GERD, depression, migraine headache, tobacco abuse, fibromyalgia, who presents with fever, chills, shortness of breath, cough, abdominal pain, diarrhea. pt was found to have positive COVID-19; CT angiogram is negative for PE; admitted 09/01/19.   Clinical Impression   Pt admitted with above diagnosis. Patient very anxious re: her illness and responded well to cues to improve her breathing and stability. She has a h/o fall in the past several weeks in her bathroom and is unsure what happened. Will need to learn to use a RW prior to discharge home.  Pt currently with functional limitations due to the deficits listed below (see PT Problem List). Pt will benefit from skilled PT to increase their independence and safety with mobility to allow discharge to the venue listed below.    NOTE-Sats on 1.5L at rest 97%, HR 65. During ambulation, sats >91% on 2L with HR max 79; returned to 1.5 L on return to sitting in room with sats 94%.      Follow Up Recommendations Home health PT;Supervision - Intermittent    Equipment Recommendations  Rolling walker with 5" wheels    Recommendations for Other Services OT consult     Precautions / Restrictions Precautions Precautions: Fall Precaution Comments: reports recent fall in her bathroom--not sure what happened      Mobility  Bed Mobility Overal bed mobility: Modified Independent                Transfers Overall transfer level: Needs assistance Equipment used: 1 person hand held assist Transfers: Sit to/from Stand Sit to Stand: Min guard         General transfer comment: nervous; feels shakey  Ambulation/Gait Ambulation/Gait assistance: Min assist Gait Distance (Feet): 140 Feet Assistive device: 1 person hand held  assist Gait Pattern/deviations: Step-through pattern;Decreased stride length;Wide base of support     General Gait Details: vc for slower pace to maintain sats; vc for wider BOS to improve stability  Stairs            Wheelchair Mobility    Modified Rankin (Stroke Patients Only)       Balance Overall balance assessment: Mild deficits observed, not formally tested                                           Pertinent Vitals/Pain Pain Assessment: Faces Faces Pain Scale: Hurts little more Pain Location: head, stomache Pain Descriptors / Indicators: Aching;Discomfort Pain Intervention(s): Limited activity within patient's tolerance;Monitored during session;Repositioned    Home Living Family/patient expects to be discharged to:: Private residence Living Arrangements: Alone Available Help at Discharge: Friend(s);Family;Available PRN/intermittently Type of Home: Apartment Home Access: Stairs to enter Entrance Stairs-Rails: Right Entrance Stairs-Number of Steps: 4 Home Layout: One level Home Equipment: None Additional Comments: may have a grab bar in shower    Prior Function Level of Independence: Independent         Comments: often does "bird bath"     Hand Dominance   Dominant Hand: Right    Extremity/Trunk Assessment   Upper Extremity Assessment Upper Extremity Assessment: Defer to OT evaluation    Lower Extremity Assessment Lower Extremity Assessment: Generalized weakness    Cervical / Trunk Assessment Cervical / Trunk Assessment:  Normal  Communication   Communication: No difficulties  Cognition Arousal/Alertness: Awake/alert Behavior During Therapy: Anxious Overall Cognitive Status: Within Functional Limits for tasks assessed                                        General Comments General comments (skin integrity, edema, etc.): educated on importance of positoning prone or upright in chair with feet on  floor    Exercises Other Exercises Other Exercises: educated in general AROM of arms and legs   Assessment/Plan    PT Assessment Patient needs continued PT services  PT Problem List Decreased strength;Decreased activity tolerance;Decreased balance;Decreased mobility;Decreased knowledge of use of DME;Cardiopulmonary status limiting activity       PT Treatment Interventions DME instruction;Gait training;Functional mobility training;Therapeutic activities;Therapeutic exercise;Balance training;Patient/family education    PT Goals (Current goals can be found in the Care Plan section)  Acute Rehab PT Goals Patient Stated Goal: to feel safe before she goes home PT Goal Formulation: With patient Time For Goal Achievement: 09/17/19 Potential to Achieve Goals: Good    Frequency Min 3X/week   Barriers to discharge Decreased caregiver support      Co-evaluation               AM-PAC PT "6 Clicks" Mobility  Outcome Measure Help needed turning from your back to your side while in a flat bed without using bedrails?: None Help needed moving from lying on your back to sitting on the side of a flat bed without using bedrails?: None Help needed moving to and from a bed to a chair (including a wheelchair)?: A Little Help needed standing up from a chair using your arms (e.g., wheelchair or bedside chair)?: A Little Help needed to walk in hospital room?: A Little Help needed climbing 3-5 steps with a railing? : A Little 6 Click Score: 20    End of Session Equipment Utilized During Treatment: Oxygen Activity Tolerance: Patient tolerated treatment well Patient left: in chair;with call bell/phone within reach Nurse Communication: Mobility status PT Visit Diagnosis: Unsteadiness on feet (R26.81);Muscle weakness (generalized) (M62.81)    Time: [id number] PT Time Calculation (min) (ACUTE ONLY): 38 min   Charges:   PT Evaluation $PT Eval Low Complexity: 1 Low PT Treatments $Gait  Training: 8-22 mins $Therapeutic Activity: 8-22 mins          Veda CanningLynn Meris Reede, PT      AdelLynn P Abdishakur Gottschall 09/03/2019, 10:20 AM

## 2019-09-03 NOTE — Progress Notes (Signed)
OT Cancellation Note  Patient Details Name: Emma Gould MRN: [id number] DOB: 28-Dec-1955   Cancelled Treatment:    Reason Eval/Treat Not Completed: Other (comment);Fatigue/lethargy limiting ability to participate; pt reports just getting back to bed having been up majority of the day, reports worked with PT earlier. Pt reports too fatigued to attempt working with OT at this time. Will follow up for OT eval as schedule permits.  Lou Cal, OT [facility] Services Pager [phone] Office [phone]   Raymondo Band 09/03/2019, 3:01 PM

## 2019-09-03 NOTE — Progress Notes (Signed)
PROGRESS NOTE                                                                                                                                                                                                             Patient Demographics:    Emma Gould, is a 63 y.o. female, DOB - [birth year], MOL:[id number]  Outpatient Primary MD for the patient is System, Pcp Not In    LOS - 2  Admit date - 08/31/2019    Chief Complaint  Patient presents with  . Abdominal Pain    HA x 4 days; HTN       Brief Narrative  63 year old woman PMH of hypertension, GERD, fibromyalgia, migraine headaches presented with fever, chills, abdominal pain and diarrhea.  Found to be hypoxic, COVID-19 positive with infiltrates on chest x-ray and CT angiogram.  Admitted for acute hypoxic respiratory failure secondary COVID-19 pneumonia, abdominal pain, diarrhea with marked hypokalemia.   Subjective:   Patient in bed, appears comfortable, denies any headache, no fever, no chest pain or pressure, no shortness of breath , no abdominal pain. No focal weakness.    Assessment  & Plan :     1. Acute Hypoxic Resp. Failure due to Acute Covid 19 Viral Pneumonitis during the ongoing 2020 Covid 19 Pandemic - she was started on combination of IV steroids, Remdisvir and Actemra.  She was on 5 L nasal cannula oxygen when she received the treatment, within 24 hours she was down to 2 L nasal cannula oxygen, currently on 1.5 L nasal cannula oxygen, inflammatory markers have stabilized, clinically she has no shortness of breath and in no distress.  Finish her IV Remdisvir course on 09/06/2019 and discharged home.  She has been requested to sit up in chair in the daytime use I-S and flutter valve for pulmonary toiletry and prone in bed at night.  COVID-19 Labs  Recent Labs    08/31/19 2310 09/01/19 0811 09/02/19 0251 09/03/19 0225  DDIMER 1.50*  --  1.43* 1.51*   FERRITIN 190 219 147 152  LDH 385* 363*  --   --   CRP 6.6* 7.7* 11.6* 5.0*    Lab Results  Component Value Date   SARSCOV2NAA POSITIVE (A) 08/31/2019     Hepatic Function Latest Ref Rng & Units 09/03/2019 09/02/2019 09/01/2019  Total Protein 6.5 - 8.1  g/dL 6.1(L) 6.8 7.1  Albumin 3.5 - 5.0 g/dL 2.3(L) 2.5(L) 2.6(L)  AST 15 - 41 U/L '16 20 27  ' ALT 0 - 44 U/L '13 16 19  ' Alk Phosphatase 38 - 126 U/L 72 89 87  Total Bilirubin 0.3 - 1.2 mg/dL 0.1(L) 0.4 0.6        Component Value Date/Time   BNP 264.6 (H) 09/03/2019 0425      2.  Severe hypokalemia.  Replaced and now stable we will continue to monitor.  3.  Generalized abdominal pain with gastroenteritis due to COVID-19.  Improved with supportive care monitor.  4.  Borderline prolonged QTC due to low potassium.  Stabilized after potassium replacement.  5.  Poorly controlled essential hypertension.  Placed on Norvasc, propranolol, currently stable monitor with PRN hydralazine.  Catapres on hold.  6.  Severe generalized anxiety.  On Lexapro, Neurontin, low-dose Klonopin added.  May benefit from outpatient psych follow-up.  7.  Smoking.  Counseled to quit.  NicoDerm patch.  8.  GERD.  Continue PPI.     Condition - Extremely Guarded  Family Communication  :  None  Code Status :  Full  Diet :   Diet Order            Diet Heart Room service appropriate? Yes; Fluid consistency: Thin  Diet effective now               Disposition Plan  :  Home on 09/06/2019 after finishing her Remdisvir course  Consults  :  None  Procedures  :     PUD Prophylaxis :  PPI  DVT Prophylaxis  :  Lovenox    Lab Results  Component Value Date   PLT 479 (H) 09/03/2019    Inpatient Medications  Scheduled Meds: . amLODipine  10 mg Oral Daily  . clonazepam  1 mg Oral BID  . enoxaparin (LOVENOX) injection  40 mg Subcutaneous Q24H  . escitalopram  20 mg Oral Daily  . feeding supplement (ENSURE ENLIVE)  237 mL Oral BID BM  . gabapentin   400 mg Oral TID  . ipratropium  2 puff Inhalation Q4H  . methylPREDNISolone (SOLU-MEDROL) injection  40 mg Intravenous Q12H  . multivitamin with minerals  1 tablet Oral Daily  . nicotine  21 mg Transdermal Daily  . pantoprazole  40 mg Oral Daily  . propranolol ER  80 mg Oral Daily  . QUEtiapine  25 mg Oral QHS  . vitamin C  500 mg Oral Daily  . zinc sulfate  220 mg Oral Daily   Continuous Infusions: . remdesivir 100 mg in NS 250 mL 100 mg (09/02/19 1524)   PRN Meds:.acetaminophen, albuterol, cyclobenzaprine, hydrALAZINE, [DISCONTINUED] ondansetron **OR** ondansetron (ZOFRAN) IV, SUMAtriptan  Antibiotics  :    Anti-infectives (From admission, onward)   Start     Dose/Rate Route Frequency Ordered Stop   09/02/19 1200  remdesivir 100 mg in sodium chloride 0.9 % 250 mL IVPB     100 mg 500 mL/hr over 30 Minutes Intravenous Every 24 hours 09/01/19 1038 09/06/19 1159   09/01/19 1200  remdesivir 200 mg in sodium chloride 0.9 % 250 mL IVPB     200 mg 500 mL/hr over 30 Minutes Intravenous Once 09/01/19 1038 09/01/19 1141       Time Spent in minutes  30   Lala Lund M.D on 09/03/2019 at 8:39 AM  To page go to [email or web address] - password [facility]  [facility] -  Office  [phone]  See all Orders from today for further details    Objective:   Vitals:   09/02/19 2026 09/03/19 0400 09/03/19 0600 09/03/19 0741  BP:  (!) 146/98  124/74  Pulse:  66  70  Resp: '20 13 19 ' (!) 21  Temp:  98.7 F (37.1 C)  98.3 F (36.8 C)  TempSrc:  Oral  Oral  SpO2:  95% 98% 98%  Weight:      Height:        Wt Readings from Last 3 Encounters:  08/31/19 68 kg     Intake/Output Summary (Last 24 hours) at 09/03/2019 0839 Last data filed at 09/03/2019 0600 Gross per 24 hour  Intake 276.39 ml  Output -  Net 276.39 ml     Physical Exam  Awake Alert, Oriented X 3, No new F.N deficits, anxious affect [place].AT,PERRAL Supple Neck,No JVD, No cervical lymphadenopathy appriciated.  Symmetrical  Chest wall movement, Good air movement bilaterally, CTAB RRR,No Gallops, Rubs or new Murmurs, No Parasternal Heave +ve B.Sounds, Abd Soft, No tenderness, No organomegaly appriciated, No rebound - guarding or rigidity. No Cyanosis, Clubbing or edema, No new Rash or bruise    Data Review:    CBC Recent Labs  Lab 08/31/19 2154 09/01/19 0811 09/02/19 0251 09/03/19 0225  WBC 10.9* 9.4 7.2 12.0*  HGB 14.3 14.7 12.9 12.6  HCT 42.3 44.7 39.9 37.8  PLT 505* 439* 415* 479*  MCV 85.1 86.6 87.1 87.5  MCH 28.8 28.5 28.2 29.2  MCHC 33.8 32.9 32.3 33.3  RDW 14.6 14.9 15.2 15.6*  LYMPHSABS 1.4  --  1.1 1.0  MONOABS 0.5  --  0.1 0.6  EOSABS 0.0  --  0.0 0.0  BASOSABS 0.0  --  0.0 0.1    Chemistries  Recent Labs  Lab 08/31/19 2154 09/01/19 0811 09/01/19 1500 09/02/19 0251 09/03/19 0225  NA 139 140 140 140 141  K 2.3* 3.5 4.1 4.7 4.7  CL 100 104 107 106 109  CO2 '25 24 22 22 ' 20*  GLUCOSE 107* 86 133* 120* 121*  BUN '13 11 11 16 23  ' CREATININE 0.89 0.78 0.77 0.72 0.75  CALCIUM 8.9 9.0 8.5* 9.0 8.7*  MG  --  2.6*  --  2.1 2.2  AST 33 27  --  20 16  ALT 21 19  --  16 13  ALKPHOS 101 87  --  89 72  BILITOT 0.7 0.6  --  0.4 0.1*   ------------------------------------------------------------------------------------------------------------------ No results for input(s): CHOL, HDL, LDLCALC, TRIG, CHOLHDL, LDLDIRECT in the last 72 hours.  No results found for: HGBA1C ------------------------------------------------------------------------------------------------------------------ No results for input(s): TSH, T4TOTAL, T3FREE, THYROIDAB in the last 72 hours.  Invalid input(s): FREET3  Cardiac Enzymes No results for input(s): CKMB, TROPONINI, MYOGLOBIN in the last 168 hours.  Invalid input(s): CK ------------------------------------------------------------------------------------------------------------------    Component Value Date/Time   BNP 264.6 (H) 09/03/2019 0425    Micro  Results Recent Results (from the past 240 hour(s))  Blood Culture (routine x 2)     Status: None (Preliminary result)   Collection Time: 08/31/19  9:40 PM   Specimen: BLOOD  Result Value Ref Range Status   Specimen Description BLOOD RIGHT ANTECUBITAL  Final   Special Requests   Final    BOTTLES DRAWN AEROBIC AND ANAEROBIC Blood Culture adequate volume   Culture   Final    NO GROWTH 2 DAYS Performed at [facility], [address], [place], [place] [address]    Report Status  PENDING  Incomplete  SARS Coronavirus 2 [facility] order, Performed in [facility] hospital lab) Nasopharyngeal Nasopharyngeal Swab     Status: Abnormal   Collection Time: 08/31/19  9:51 PM   Specimen: Nasopharyngeal Swab  Result Value Ref Range Status   SARS Coronavirus 2 POSITIVE (A) NEGATIVE Final    Comment: RESULT CALLED TO, READ BACK BY AND VERIFIED WITH: Rockne Coons RN 08/31/19 2331 JDW (NOTE) If result is NEGATIVE SARS-CoV-2 target nucleic acids are NOT DETECTED. The SARS-CoV-2 RNA is generally detectable in upper and lower  respiratory specimens during the acute phase of infection. The lowest  concentration of SARS-CoV-2 viral copies this assay can detect is 250  copies / mL. A negative result does not preclude SARS-CoV-2 infection  and should not be used as the sole basis for treatment or other  patient management decisions.  A negative result may occur with  improper specimen collection / handling, submission of specimen other  than nasopharyngeal swab, presence of viral mutation(s) within the  areas targeted by this assay, and inadequate number of viral copies  (<250 copies / mL). A negative result must be combined with clinical  observations, patient history, and epidemiological information. If result is POSITIVE SARS-CoV-2 target nucleic acids are DETECTED. The S ARS-CoV-2 RNA is generally detectable in upper and lower  respiratory specimens during the acute phase of infection.  Positive   results are indicative of active infection with SARS-CoV-2.  Clinical  correlation with patient history and other diagnostic information is  necessary to determine patient infection status.  Positive results do  not rule out bacterial infection or co-infection with other viruses. If result is PRESUMPTIVE POSTIVE SARS-CoV-2 nucleic acids MAY BE PRESENT.   A presumptive positive result was obtained on the submitted specimen  and confirmed on repeat testing.  While 2019 novel coronavirus  (SARS-CoV-2) nucleic acids may be present in the submitted sample  additional confirmatory testing may be necessary for epidemiological  and / or clinical management purposes  to differentiate between  SARS-CoV-2 and other Sarbecovirus currently known to infect humans.  If clinically indicated additional testing with an alternate test  methodology [phone]) is adv ised. The SARS-CoV-2 RNA is generally  detectable in upper and lower respiratory specimens during the acute  phase of infection. The expected result is Negative. Fact Sheet for Patients:  StrictlyIdeas.no Fact Sheet for Healthcare Providers: BankingDealers.co.za This test is not yet approved or cleared by the [place] FDA and has been authorized for detection and/or diagnosis of SARS-CoV-2 by FDA under an Emergency Use Authorization (EUA).  This EUA will remain in effect (meaning this test can be used) for the duration of the COVID-19 declaration under Section 564(b)(1) of the Act, 21 U.S.C. section 360bbb-3(b)(1), unless the authorization is terminated or revoked sooner. Performed at [facility], [place] [address], [place], [place] [address]   Blood Culture (routine x 2)     Status: None (Preliminary result)   Collection Time: 09/01/19 12:42 AM   Specimen: BLOOD RIGHT HAND  Result Value Ref Range Status   Specimen Description BLOOD RIGHT HAND  Final   Special Requests   Final     BOTTLES DRAWN AEROBIC AND ANAEROBIC Blood Culture adequate volume   Culture   Final    NO GROWTH 2 DAYS Performed at [facility], [place] [address] [place], [place] [address]    Report Status PENDING  Incomplete    Radiology Reports Ct Angio Chest Pe W And/or Wo  Contrast  Result Date: 09/01/2019 CLINICAL DATA:  Abdominal pain, O2 saturation 88% on room air EXAM: CT ANGIOGRAPHY CHEST WITH CONTRAST TECHNIQUE: Multidetector CT imaging of the chest was performed using the standard protocol during bolus administration of intravenous contrast. Multiplanar CT image reconstructions and MIPs were obtained to evaluate the vascular anatomy. CONTRAST:  180m OMNIPAQUE IOHEXOL 350 MG/ML SOLN COMPARISON:  None. FINDINGS: Cardiovascular: There is a optimal opacification of the pulmonary arteries. There is no central,segmental, or subsegmental filling defects within the pulmonary arteries. The heart is normal in size. No pericardial effusion thickening. No evidence right heart strain. There is normal three-vessel brachiocephalic anatomy without proximal stenosis. The thoracic aorta is normal in appearance. Mediastinum/Nodes: No hilar, mediastinal, or axillary adenopathy. Thyroid gland, trachea, and esophagus demonstrate no significant findings. Lungs/Pleura: There is dense of patchy airspace opacities seen within the periphery and predominantly within the upper lobes. No pleural effusion is seen. Upper Abdomen: No acute abnormalities present in the visualized portions of the upper abdomen. Musculoskeletal: No chest wall abnormality. No acute or significant osseous findings. Review of the MIP images confirms the above findings. IMPRESSION: Patchy airspace opacities throughout both lungs. There are a spectrum of findings in the lungs which can be seen with acute atypical infection (as well as other non-infectious etiologies). In particular, viral pneumonia (including COVID-19) should be considered in the appropriate  clinical setting. No central, segmental, or subsegmental pulmonary embolism. Electronically Signed   By: BPrudencio PairM.D.   On: 09/01/2019 00:59   Dg Chest Portable 1 View  Result Date: 08/31/2019 CLINICAL DATA:  Shortness of breath EXAM: PORTABLE CHEST 1 VIEW COMPARISON:  01/07/2018 FINDINGS: Heart is upper limits normal in size. Patchy bilateral airspace opacities are noted concerning for multifocal pneumonia. No effusion or pneumothorax. No acute bony abnormality. IMPRESSION: Patchy bilateral airspace opacities concerning for pneumonia. Electronically Signed   By: KRolm BaptiseM.D.   On: 08/31/2019 22:01

## 2019-09-03 NOTE — Progress Notes (Signed)
Notified mother of progress.  All questions were answered and this nurse's contact number shared for further communication.   

## 2019-09-03 NOTE — Progress Notes (Signed)
Patient spoke with her mother via phone. Mother was also updated per this RN.

## 2019-09-04 LAB — CBC WITH DIFFERENTIAL/PLATELET
Abs Immature Granulocytes: 0.26 10*3/uL — ABNORMAL HIGH (ref 0.00–0.07)
Basophils Absolute: 0 10*3/uL (ref 0.0–0.1)
Basophils Relative: 0 %
Eosinophils Absolute: 0 10*3/uL (ref 0.0–0.5)
Eosinophils Relative: 0 %
HCT: 38.5 % (ref 36.0–46.0)
Hemoglobin: 12.6 g/dL (ref 12.0–15.0)
Immature Granulocytes: 2 %
Lymphocytes Relative: 7 %
Lymphs Abs: 0.8 10*3/uL (ref 0.7–4.0)
MCH: 28.8 pg (ref 26.0–34.0)
MCHC: 32.7 g/dL (ref 30.0–36.0)
MCV: 87.9 fL (ref 80.0–100.0)
Monocytes Absolute: 0.3 10*3/uL (ref 0.1–1.0)
Monocytes Relative: 3 %
Neutro Abs: 9.5 10*3/uL — ABNORMAL HIGH (ref 1.7–7.7)
Neutrophils Relative %: 88 %
Platelets: 474 10*3/uL — ABNORMAL HIGH (ref 150–400)
RBC: 4.38 MIL/uL (ref 3.87–5.11)
RDW: 15.8 % — ABNORMAL HIGH (ref 11.5–15.5)
WBC: 11 10*3/uL — ABNORMAL HIGH (ref 4.0–10.5)
nRBC: 0 % (ref 0.0–0.2)

## 2019-09-04 LAB — FERRITIN: Ferritin: 172 ng/mL (ref 11–307)

## 2019-09-04 LAB — COMPREHENSIVE METABOLIC PANEL
ALT: 15 U/L (ref 0–44)
AST: 19 U/L (ref 15–41)
Albumin: 2.6 g/dL — ABNORMAL LOW (ref 3.5–5.0)
Alkaline Phosphatase: 69 U/L (ref 38–126)
Anion gap: 7 (ref 5–15)
BUN: 26 mg/dL — ABNORMAL HIGH (ref 8–23)
CO2: 23 mmol/L (ref 22–32)
Calcium: 8.5 mg/dL — ABNORMAL LOW (ref 8.9–10.3)
Chloride: 109 mmol/L (ref 98–111)
Creatinine, Ser: 0.86 mg/dL (ref 0.44–1.00)
GFR calc Af Amer: >60 mL/min (ref >60)
GFR calc non Af Amer: >60 mL/min (ref >60)
Glucose, Bld: 124 mg/dL — ABNORMAL HIGH (ref 70–99)
Potassium: 4.8 mmol/L (ref 3.5–5.1)
Sodium: 139 mmol/L (ref 135–145)
Total Bilirubin: 0.5 mg/dL (ref 0.3–1.2)
Total Protein: 6.1 g/dL — ABNORMAL LOW (ref 6.5–8.1)

## 2019-09-04 LAB — MAGNESIUM: Magnesium: 2.3 mg/dL (ref 1.7–2.4)

## 2019-09-04 LAB — D-DIMER, QUANTITATIVE: D-Dimer, Quant: 1.45 ug/mL-FEU — ABNORMAL HIGH (ref 0.00–0.50)

## 2019-09-04 LAB — BRAIN NATRIURETIC PEPTIDE: B Natriuretic Peptide: 218.7 pg/mL — ABNORMAL HIGH (ref 0.0–100.0)

## 2019-09-04 LAB — C-REACTIVE PROTEIN: CRP: 2.3 mg/dL — ABNORMAL HIGH (ref <1.0)

## 2019-09-04 MED ORDER — METHYLPREDNISOLONE SODIUM SUCC 40 MG IJ SOLR
40.0000 mg | Freq: Every day | INTRAMUSCULAR | Status: DC
  Administered 2019-09-04 – 2019-09-05 (×2): 40 mg via INTRAVENOUS
  Filled 2019-09-04 (×2): qty 1

## 2019-09-04 MED ORDER — FUROSEMIDE 10 MG/ML IJ SOLN
40.0000 mg | Freq: Once | INTRAMUSCULAR | Status: AC
  Administered 2019-09-04: 40 mg via INTRAVENOUS
  Filled 2019-09-04: qty 4

## 2019-09-04 MED ORDER — CYCLOBENZAPRINE HCL 5 MG PO TABS
5.0000 mg | ORAL_TABLET | Freq: Two times a day (BID) | ORAL | Status: DC | PRN
  Filled 2019-09-04: qty 1

## 2019-09-04 NOTE — Progress Notes (Signed)
Physical Therapy Treatment Patient Details Name: Emma BatonVickie Millis MRN: [id number] DOB: 04/22/1956 Today's Date: 09/04/2019    History of Present Illness 63 y.o. female with medical history significant of hypertension, GERD, depression, migraine headache, tobacco abuse, fibromyalgia, who presents with fever, chills, shortness of breath, cough, abdominal pain, diarrhea. pt was found to have positive COVID-19; CT angiogram is negative for PE; admitted 09/01/19.     PT Comments    Patient educated in use of rollator, including proper sequencing for transfers and for using rollator seat for rest breaks. Patient felt more secure using rollator. Sats remained >90% while on room air x 180 ft.    Follow Up Recommendations  Home health PT;Supervision - Intermittent     Equipment Recommendations  Other (comment)(rollator with seat)    Recommendations for Other Services OT consult     Precautions / Restrictions Precautions Precautions: Fall Precaution Comments: reports recent fall in her bathroom--not sure what happened Restrictions Weight Bearing Restrictions: No    Mobility  Bed Mobility Overal bed mobility: Modified Independent             General bed mobility comments: HOB flat, no rail  Transfers Overall transfer level: Needs assistance Equipment used: 4-wheeled walker Transfers: Sit to/from Stand Sit to Stand: Min guard         General transfer comment: educated in use of rollator; vc for 3 reps  Ambulation/Gait Ambulation/Gait assistance: Min guard Gait Distance (Feet): 180 Feet Assistive device: 4-wheeled walker Gait Pattern/deviations: Step-through pattern;Decreased stride length;Wide base of support;Drifts right/left     General Gait Details: pt drifts when turning head to look left and right; pt is aware and corrects, did not run into anything   Stairs             Wheelchair Mobility    Modified Rankin (Stroke Patients Only)       Balance Overall  balance assessment: Mild deficits observed, not formally tested                                          Cognition Arousal/Alertness: Awake/alert Behavior During Therapy: Anxious Overall Cognitive Status: Within Functional Limits for tasks assessed                                 General Comments: less anxious than 9/7      Exercises      General Comments        Pertinent Vitals/Pain Pain Assessment: No/denies pain    Home Living                      Prior Function            PT Goals (current goals can now be found in the care plan section) Acute Rehab PT Goals Patient Stated Goal: to feel safe before she goes home Time For Goal Achievement: 09/17/19 Potential to Achieve Goals: Good Progress towards PT goals: Progressing toward goals    Frequency    Min 3X/week      PT Plan Current plan remains appropriate    Co-evaluation              AM-PAC PT "6 Clicks" Mobility   Outcome Measure  Help needed turning from your back to your side while in a flat bed without using  bedrails?: None Help needed moving from lying on your back to sitting on the side of a flat bed without using bedrails?: None Help needed moving to and from a bed to a chair (including a wheelchair)?: A Little Help needed standing up from a chair using your arms (e.g., wheelchair or bedside chair)?: A Little Help needed to walk in hospital room?: A Little Help needed climbing 3-5 steps with a railing? : None 6 Click Score: 21    End of Session   Activity Tolerance: Patient tolerated treatment well Patient left: with call bell/phone within reach;in bed;with nursing/sitter in room Nurse Communication: Mobility status PT Visit Diagnosis: Unsteadiness on feet (R26.81);Muscle weakness (generalized) (M62.81)     Time: 0354-6568 PT Time Calculation (min) (ACUTE ONLY): 30 min  Charges:  $Gait Training: 23-37 mins                       Barry Brunner, PT       [place] [facility] Siya Flurry 09/04/2019, 4:20 PM

## 2019-09-04 NOTE — Progress Notes (Signed)
No acute events overnight. VSS, sats maintained >90% on 1L throughout shift. Pt slept well.

## 2019-09-04 NOTE — Evaluation (Signed)
Occupational Therapy Evaluation Patient Details Name: Emma Gould MRN: [id number] DOB: [birth year] Today's Date: 09/04/2019    History of Present Illness 63 y.o. female with medical history significant of hypertension, GERD, depression, migraine headache, tobacco abuse, fibromyalgia, who presents with fever, chills, shortness of breath, cough, abdominal pain, diarrhea. pt was found to have positive COVID-19; CT angiogram is negative for PE; admitted 09/01/19.    Clinical Impression   This 63 y/o female presents with the above. PTA pt reports independence with ADL and functional mobility. Pt presenting with decreased activity tolerance and weakness, somewhat anxious with activity and requires encouragement initially to participate in therapy session. Pt requiring minA (HHA)-close minguard assist for room level functional mobility. She currently requires close minguard assist for LB and standing grooming ADL, minA for toileting ADL. Pt on RA during session, O2 sats briefly decreasing to 87% but overall sats maintaining >/= 90%. Pt lives alone but reports she has family (daughter) and/or neighbors who can assist with groceries, etc. She will benefit from continued acute OT services and recommend follow up [facility] services after discharge to progress her towards her PLOF. Will follow.     Follow Up Recommendations  Home health OT;Supervision - Intermittent    Equipment Recommendations  3 in 1 bedside commode           Precautions / Restrictions Precautions Precautions: Fall Precaution Comments: reports recent fall in her bathroom--not sure what happened Restrictions Weight Bearing Restrictions: No      Mobility Bed Mobility Overal bed mobility: Modified Independent             General bed mobility comments: HOB elevated  Transfers Overall transfer level: Needs assistance Equipment used: None Transfers: Sit to/from Stand Sit to Stand: Min guard         General transfer  comment: for safety, lines; performed multiple times during session    Balance Overall balance assessment: Mild deficits observed, not formally tested                                         ADL either performed or assessed with clinical judgement   ADL Overall ADL's : Needs assistance/impaired Eating/Feeding: Modified independent;Sitting   Grooming: Wash/dry hands;Min guard;Standing   Upper Body Bathing: Set up;Min guard;Sitting   Lower Body Bathing: Min guard;Sit to/from stand   Upper Body Dressing : Set up;Sitting   Lower Body Dressing: Min guard;Sit to/from stand   Toilet Transfer: Min guard;Minimal assistance;Ambulation;Regular Glass blower/designer Details (indicate cue type and reason): intermittent HHA during mobility Toileting- Clothing Manipulation and Hygiene: Min guard;Minimal assistance;Sit to/from stand Toileting - Clothing Manipulation Details (indicate cue type and reason): light minA for gown management; pt performing additional clothing management (underwear) and pericare without assist     Functional mobility during ADLs: Min guard;Minimal assistance General ADL Comments: pt slow and cautious with mobility, requires intermittent HHA; performed mobility EOB to door and back, then to bathroom for toileting ADL                          Pertinent Vitals/Pain Pain Assessment: Faces Faces Pain Scale: No hurt Pain Intervention(s): Monitored during session     Hand Dominance Right   Extremity/Trunk Assessment Upper Extremity Assessment Upper Extremity Assessment: Generalized weakness   Lower Extremity Assessment Lower Extremity Assessment: Defer to PT evaluation   Cervical / Trunk  Assessment Cervical / Trunk Assessment: Normal   Communication Communication Communication: No difficulties   Cognition Arousal/Alertness: Awake/alert Behavior During Therapy: Anxious Overall Cognitive Status: Within Functional Limits for tasks  assessed                                 General Comments: pt easily distracted and requires some redirection, suspect she is likely at her baseline   General Comments  O2 sats briefly down to 87%, overall maintained >/= 90% on RA    Exercises Exercises: Other exercises Other Exercises Other Exercises: sit<>stand x5 from EOB   Shoulder Instructions      Home Living Family/patient expects to be discharged to:: Private residence Living Arrangements: Alone Available Help at Discharge: Friend(s);Family;Available PRN/intermittently Type of Home: Apartment Home Access: Stairs to enter Entrance Stairs-Number of Steps: 4 Entrance Stairs-Rails: Right Home Layout: One level     Bathroom Shower/Tub: Tub/shower unit;Curtain   FirefighterBathroom Toilet: Handicapped height Bathroom Accessibility: Yes   Home Equipment: None   Additional Comments: may have a grab bar in shower      Prior Functioning/Environment Level of Independence: Independent        Comments: often does "bird bath"        OT Problem List: Decreased strength;Decreased range of motion;Impaired balance (sitting and/or standing);Cardiopulmonary status limiting activity;Decreased activity tolerance;Decreased knowledge of use of DME or AE      OT Treatment/Interventions: Self-care/ADL training;Therapeutic exercise;Neuromuscular education;DME and/or AE instruction;Therapeutic activities;Patient/family education;Balance training;Energy conservation    OT Goals(Current goals can be found in the care plan section) Acute Rehab OT Goals Patient Stated Goal: to feel safe before she goes home OT Goal Formulation: With patient Time For Goal Achievement: 09/18/19 Potential to Achieve Goals: Good  OT Frequency: Min 2X/week   Barriers to D/C:            Co-evaluation              AM-PAC OT "6 Clicks" Daily Activity     Outcome Measure Help from another person eating meals?: None Help from another person  taking care of personal grooming?: A Little Help from another person toileting, which includes using toliet, bedpan, or urinal?: A Little Help from another person bathing (including washing, rinsing, drying)?: A Little Help from another person to put on and taking off regular upper body clothing?: None Help from another person to put on and taking off regular lower body clothing?: A Little 6 Click Score: 20   End of Session Nurse Communication: Mobility status  Activity Tolerance: Patient tolerated treatment well Patient left: in bed;with call bell/phone within reach  OT Visit Diagnosis: Unsteadiness on feet (R26.81);Muscle weakness (generalized) (M62.81)                Time: [id number] OT Time Calculation (min): 41 min Charges:  OT General Charges $OT Visit: 1 Visit OT Evaluation $OT Eval Moderate Complexity: 1 Mod OT Treatments $Self Care/Home Management : 23-37 mins  Marcy SirenBreanna Berneice Zettlemoyer, OT [facility] Rehabilitation Services Pager [phone] Office [phone]   [place]lando PennerBreanna L Kiera Hussey 09/04/2019, 10:35 AM

## 2019-09-04 NOTE — Progress Notes (Signed)
PROGRESS NOTE                                                                                                                                                                                                             Patient Demographics:    Emma Gould, is a 63 y.o. female, DOB - 1956/01/08, XYB:[id number]  Outpatient Primary MD for the patient is System, Pcp Not In    LOS - 3  Admit date - 08/31/2019    Chief Complaint  Patient presents with  . Abdominal Pain    HA x 4 days; HTN       Brief Narrative  63 year old woman PMH of hypertension, GERD, fibromyalgia, migraine headaches presented with fever, chills, abdominal pain and diarrhea.  Found to be hypoxic, COVID-19 positive with infiltrates on chest x-ray and CT angiogram.  Admitted for acute hypoxic respiratory failure secondary COVID-19 pneumonia, abdominal pain, diarrhea with marked hypokalemia.   Subjective:   Patient in bed, appears comfortable, denies any headache, no fever, no chest pain or pressure, no shortness of breath , no abdominal pain. No focal weakness.   Assessment  & Plan :     1. Acute Hypoxic Resp. Failure due to Acute Covid 19 Viral Pneumonitis during the ongoing 2020 Covid 19 Pandemic - she was started on combination of IV steroids, Remdisvir and Actemra.  She was on 5 L nasal cannula oxygen when she received the treatment, within 24 hours she was down to 2 L nasal cannula oxygen, and today she is on room air and completely symptom-free, inflammatory markers have stabilized, clinically she has no shortness of breath and in no distress.  She will finish her course of IV Remdisvir on 09/06/2019 thereafter she can be discharged home.  She has been requested to sit up in chair in the daytime use I-S and flutter valve for pulmonary toiletry and prone in bed at night.  COVID-19 Labs  Recent Labs    09/02/19 0251 09/03/19 0225 09/04/19 0310  DDIMER  1.43* 1.51* 1.45*  FERRITIN 147 152 172  CRP 11.6* 5.0* 2.3*    Lab Results  Component Value Date   SARSCOV2NAA POSITIVE (A) 08/31/2019     Hepatic Function Latest Ref Rng & Units 09/04/2019 09/03/2019 09/02/2019  Total Protein 6.5 - 8.1 g/dL 6.1(L) 6.1(L) 6.8  Albumin 3.5 - 5.0  g/dL 2.6(L) 2.3(L) 2.5(L)  AST 15 - 41 U/L '19 16 20  ' ALT 0 - 44 U/L '15 13 16  ' Alk Phosphatase 38 - 126 U/L 69 72 89  Total Bilirubin 0.3 - 1.2 mg/dL 0.5 0.1(L) 0.4        Component Value Date/Time   BNP 218.7 (H) 09/04/2019 0310      2.  Severe hypokalemia.  Replaced and now stable we will continue to monitor.  3.  Generalized abdominal pain with gastroenteritis due to COVID-19.  Completely resolved with supportive care.  4.  Borderline prolonged QTC due to low potassium.  Stabilized after potassium replacement.  5.  Poorly controlled essential hypertension.  Placed on Norvasc, propranolol, currently stable monitor with PRN hydralazine.  Catapres on hold.  6.  Severe generalized anxiety.  On Lexapro, Neurontin, low-dose Klonopin added.  May benefit from outpatient psych follow-up.  7.  Smoking.  Counseled to quit.  NicoDerm patch.  8.  GERD.  Continue PPI.     Condition - Extremely Guarded  Family Communication  :  None  Code Status :  Full  Diet :   Diet Order            Diet Heart Room service appropriate? Yes; Fluid consistency: Thin  Diet effective now               Disposition Plan  :  Home on 09/06/2019 after finishing her Remdisvir course  Consults  :  None  Procedures  :     PUD Prophylaxis :  PPI  DVT Prophylaxis  :  Lovenox    Lab Results  Component Value Date   PLT 474 (H) 09/04/2019    Inpatient Medications  Scheduled Meds: . amLODipine  10 mg Oral Daily  . clonazepam  1 mg Oral BID  . enoxaparin (LOVENOX) injection  40 mg Subcutaneous Q24H  . escitalopram  20 mg Oral Daily  . feeding supplement (ENSURE ENLIVE)  237 mL Oral BID BM  . gabapentin  400 mg  Oral TID  . ipratropium  2 puff Inhalation Q4H  . methylPREDNISolone (SOLU-MEDROL) injection  40 mg Intravenous Daily  . multivitamin with minerals  1 tablet Oral Daily  . nicotine  21 mg Transdermal Daily  . pantoprazole  40 mg Oral Daily  . propranolol ER  80 mg Oral Daily  . QUEtiapine  25 mg Oral QHS  . vitamin C  500 mg Oral Daily  . zinc sulfate  220 mg Oral Daily   Continuous Infusions: . remdesivir 100 mg in NS 250 mL Stopped (09/03/19 1450)   PRN Meds:.acetaminophen, albuterol, cyclobenzaprine, hydrALAZINE, [DISCONTINUED] ondansetron **OR** ondansetron (ZOFRAN) IV, SUMAtriptan  Antibiotics  :    Anti-infectives (From admission, onward)   Start     Dose/Rate Route Frequency Ordered Stop   09/02/19 1200  remdesivir 100 mg in sodium chloride 0.9 % 250 mL IVPB     100 mg 500 mL/hr over 30 Minutes Intravenous Every 24 hours 09/01/19 1038 09/06/19 1159   09/01/19 1200  remdesivir 200 mg in sodium chloride 0.9 % 250 mL IVPB     200 mg 500 mL/hr over 30 Minutes Intravenous Once 09/01/19 1038 09/01/19 1141       Time Spent in minutes  30   Lala Lund M.D on 09/04/2019 at 8:42 AM  To page go to [email or web address] - password [email or web address]  [facility] -  Office  [phone]   See all Orders from today for further details  Objective:   Vitals:   09/04/19 0000 09/04/19 0410 09/04/19 0610 09/04/19 0827  BP:  (!) 114/57  124/77  Pulse: 62 63 63 64  Resp: '17 15 19   ' Temp:  98.1 F (36.7 C)    TempSrc:  Oral    SpO2: 97% 98% 91%   Weight:      Height:        Wt Readings from Last 3 Encounters:  08/31/19 68 kg     Intake/Output Summary (Last 24 hours) at 09/04/2019 0842 Last data filed at 09/04/2019 0410 Gross per 24 hour  Intake 1530 ml  Output 450 ml  Net 1080 ml     Physical Exam Awake Alert, Oriented X 3, No new F.N deficits, anxious affect [place].AT,PERRAL Supple Neck,No JVD, No cervical lymphadenopathy appriciated.  Symmetrical Chest wall movement, Good  air movement bilaterally, CTAB RRR,No Gallops, Rubs or new Murmurs, No Parasternal Heave +ve B.Sounds, Abd Soft, No tenderness, No organomegaly appriciated, No rebound - guarding or rigidity. No Cyanosis, Clubbing or edema, No new Rash or bruise     Data Review:    CBC Recent Labs  Lab 08/31/19 2154 09/01/19 0811 09/02/19 0251 09/03/19 0225 09/04/19 0310  WBC 10.9* 9.4 7.2 12.0* 11.0*  HGB 14.3 14.7 12.9 12.6 12.6  HCT 42.3 44.7 39.9 37.8 38.5  PLT 505* 439* 415* 479* 474*  MCV 85.1 86.6 87.1 87.5 87.9  MCH 28.8 28.5 28.2 29.2 28.8  MCHC 33.8 32.9 32.3 33.3 32.7  RDW 14.6 14.9 15.2 15.6* 15.8*  LYMPHSABS 1.4  --  1.1 1.0 0.8  MONOABS 0.5  --  0.1 0.6 0.3  EOSABS 0.0  --  0.0 0.0 0.0  BASOSABS 0.0  --  0.0 0.1 0.0    Chemistries  Recent Labs  Lab 08/31/19 2154 09/01/19 0811 09/01/19 1500 09/02/19 0251 09/03/19 0225 09/04/19 0310  NA 139 140 140 140 141 139  K 2.3* 3.5 4.1 4.7 4.7 4.8  CL 100 104 107 106 109 109  CO2 '25 24 22 22 ' 20* 23  GLUCOSE 107* 86 133* 120* 121* 124*  BUN '13 11 11 16 23 ' 26*  CREATININE 0.89 0.78 0.77 0.72 0.75 0.86  CALCIUM 8.9 9.0 8.5* 9.0 8.7* 8.5*  MG  --  2.6*  --  2.1 2.2 2.3  AST 33 27  --  '20 16 19  ' ALT 21 19  --  '16 13 15  ' ALKPHOS 101 87  --  89 72 69  BILITOT 0.7 0.6  --  0.4 0.1* 0.5   ------------------------------------------------------------------------------------------------------------------ No results for input(s): CHOL, HDL, LDLCALC, TRIG, CHOLHDL, LDLDIRECT in the last 72 hours.  No results found for: HGBA1C ------------------------------------------------------------------------------------------------------------------ No results for input(s): TSH, T4TOTAL, T3FREE, THYROIDAB in the last 72 hours.  Invalid input(s): FREET3  Cardiac Enzymes No results for input(s): CKMB, TROPONINI, MYOGLOBIN in the last 168 hours.  Invalid input(s):  CK ------------------------------------------------------------------------------------------------------------------    Component Value Date/Time   BNP 218.7 (H) 09/04/2019 0310    Micro Results Recent Results (from the past 240 hour(s))  Blood Culture (routine x 2)     Status: None (Preliminary result)   Collection Time: 08/31/19  9:40 PM   Specimen: BLOOD  Result Value Ref Range Status   Specimen Description BLOOD RIGHT ANTECUBITAL  Final   Special Requests   Final    BOTTLES DRAWN AEROBIC AND ANAEROBIC Blood Culture adequate volume   Culture   Final    NO GROWTH 3 DAYS Performed at [facility]  [facility], [place] [address], [place], [place] [address]    Report Status PENDING  Incomplete  SARS Coronavirus 2 [facility] order, Performed in [facility] hospital lab) Nasopharyngeal Nasopharyngeal Swab     Status: Abnormal   Collection Time: 08/31/19  9:51 PM   Specimen: Nasopharyngeal Swab  Result Value Ref Range Status   SARS Coronavirus 2 POSITIVE (A) NEGATIVE Final    Comment: RESULT CALLED TO, READ BACK BY AND VERIFIED WITH: Rockne Coons RN 08/31/19 2331 JDW (NOTE) If result is NEGATIVE SARS-CoV-2 target nucleic acids are NOT DETECTED. The SARS-CoV-2 RNA is generally detectable in upper and lower  respiratory specimens during the acute phase of infection. The lowest  concentration of SARS-CoV-2 viral copies this assay can detect is 250  copies / mL. A negative result does not preclude SARS-CoV-2 infection  and should not be used as the sole basis for treatment or other  patient management decisions.  A negative result may occur with  improper specimen collection / handling, submission of specimen other  than nasopharyngeal swab, presence of viral mutation(s) within the  areas targeted by this assay, and inadequate number of viral copies  (<250 copies / mL). A negative result must be combined with clinical  observations, patient history, and epidemiological information. If result is  POSITIVE SARS-CoV-2 target nucleic acids are DETECTED. The S ARS-CoV-2 RNA is generally detectable in upper and lower  respiratory specimens during the acute phase of infection.  Positive  results are indicative of active infection with SARS-CoV-2.  Clinical  correlation with patient history and other diagnostic information is  necessary to determine patient infection status.  Positive results do  not rule out bacterial infection or co-infection with other viruses. If result is PRESUMPTIVE POSTIVE SARS-CoV-2 nucleic acids MAY BE PRESENT.   A presumptive positive result was obtained on the submitted specimen  and confirmed on repeat testing.  While 2019 novel coronavirus  (SARS-CoV-2) nucleic acids may be present in the submitted sample  additional confirmatory testing may be necessary for epidemiological  and / or clinical management purposes  to differentiate between  SARS-CoV-2 and other Sarbecovirus currently known to infect humans.  If clinically indicated additional testing with an alternate test  methodology [phone]) is adv ised. The SARS-CoV-2 RNA is generally  detectable in upper and lower respiratory specimens during the acute  phase of infection. The expected result is Negative. Fact Sheet for Patients:  StrictlyIdeas.no Fact Sheet for Healthcare Providers: BankingDealers.co.za This test is not yet approved or cleared by the [place] FDA and has been authorized for detection and/or diagnosis of SARS-CoV-2 by FDA under an Emergency Use Authorization (EUA).  This EUA will remain in effect (meaning this test can be used) for the duration of the COVID-19 declaration under Section 564(b)(1) of the Act, 21 U.S.C. section 360bbb-3(b)(1), unless the authorization is terminated or revoked sooner. Performed at [facility], [place] [address] [place], [place] [address]   Blood Culture (routine x 2)     Status: None  (Preliminary result)   Collection Time: 09/01/19 12:42 AM   Specimen: BLOOD RIGHT HAND  Result Value Ref Range Status   Specimen Description BLOOD RIGHT HAND  Final   Special Requests   Final    BOTTLES DRAWN AEROBIC AND ANAEROBIC Blood Culture adequate volume   Culture   Final    NO GROWTH 3 DAYS Performed at [facility], [place] [address], [place], [place] [address]    Report Status  PENDING  Incomplete    Radiology Reports Ct Angio Chest Pe W And/or Wo Contrast  Result Date: 09/01/2019 CLINICAL DATA:  Abdominal pain, O2 saturation 88% on room air EXAM: CT ANGIOGRAPHY CHEST WITH CONTRAST TECHNIQUE: Multidetector CT imaging of the chest was performed using the standard protocol during bolus administration of intravenous contrast. Multiplanar CT image reconstructions and MIPs were obtained to evaluate the vascular anatomy. CONTRAST:  165m OMNIPAQUE IOHEXOL 350 MG/ML SOLN COMPARISON:  None. FINDINGS: Cardiovascular: There is a optimal opacification of the pulmonary arteries. There is no central,segmental, or subsegmental filling defects within the pulmonary arteries. The heart is normal in size. No pericardial effusion thickening. No evidence right heart strain. There is normal three-vessel brachiocephalic anatomy without proximal stenosis. The thoracic aorta is normal in appearance. Mediastinum/Nodes: No hilar, mediastinal, or axillary adenopathy. Thyroid gland, trachea, and esophagus demonstrate no significant findings. Lungs/Pleura: There is dense of patchy airspace opacities seen within the periphery and predominantly within the upper lobes. No pleural effusion is seen. Upper Abdomen: No acute abnormalities present in the visualized portions of the upper abdomen. Musculoskeletal: No chest wall abnormality. No acute or significant osseous findings. Review of the MIP images confirms the above findings. IMPRESSION: Patchy airspace opacities throughout both lungs. There are a spectrum of findings  in the lungs which can be seen with acute atypical infection (as well as other non-infectious etiologies). In particular, viral pneumonia (including COVID-19) should be considered in the appropriate clinical setting. No central, segmental, or subsegmental pulmonary embolism. Electronically Signed   By: BPrudencio PairM.D.   On: 09/01/2019 00:59   Dg Chest Port 1 View  Result Date: 09/03/2019 CLINICAL DATA:  Shortness of breath and decreased oxygen saturation. COVID-19 positive patient. EXAM: PORTABLE CHEST 1 VIEW COMPARISON:  Single-view of the chest 08/31/2019. CT chest 09/01/2019. FINDINGS: Left worse than right patchy airspace disease persists. There has been some worsening of aeration in the left chest since the prior plain film. Heart size is normal. No pneumothorax or pleural effusion. IMPRESSION: Patchy bilateral airspace disease consistent with pneumonia is worse on the left where it has progressed since 08/31/2019. Electronically Signed   By: TInge RiseM.D.   On: 09/03/2019 08:45   Dg Chest Portable 1 View  Result Date: 08/31/2019 CLINICAL DATA:  Shortness of breath EXAM: PORTABLE CHEST 1 VIEW COMPARISON:  01/07/2018 FINDINGS: Heart is upper limits normal in size. Patchy bilateral airspace opacities are noted concerning for multifocal pneumonia. No effusion or pneumothorax. No acute bony abnormality. IMPRESSION: Patchy bilateral airspace opacities concerning for pneumonia. Electronically Signed   By: KRolm BaptiseM.D.   On: 08/31/2019 22:01

## 2019-09-05 LAB — COMPREHENSIVE METABOLIC PANEL
ALT: 22 U/L (ref 0–44)
AST: 30 U/L (ref 15–41)
Albumin: 2.7 g/dL — ABNORMAL LOW (ref 3.5–5.0)
Alkaline Phosphatase: 68 U/L (ref 38–126)
Anion gap: 14 (ref 5–15)
BUN: 30 mg/dL — ABNORMAL HIGH (ref 8–23)
CO2: 26 mmol/L (ref 22–32)
Calcium: 8.9 mg/dL (ref 8.9–10.3)
Chloride: 102 mmol/L (ref 98–111)
Creatinine, Ser: 0.89 mg/dL (ref 0.44–1.00)
GFR calc Af Amer: >60 mL/min (ref >60)
GFR calc non Af Amer: >60 mL/min (ref >60)
Glucose, Bld: 86 mg/dL (ref 70–99)
Potassium: 4.2 mmol/L (ref 3.5–5.1)
Sodium: 142 mmol/L (ref 135–145)
Total Bilirubin: 0.4 mg/dL (ref 0.3–1.2)
Total Protein: 6.2 g/dL — ABNORMAL LOW (ref 6.5–8.1)

## 2019-09-05 LAB — CBC WITH DIFFERENTIAL/PLATELET
Abs Immature Granulocytes: 0.24 10*3/uL — ABNORMAL HIGH (ref 0.00–0.07)
Basophils Absolute: 0.1 10*3/uL (ref 0.0–0.1)
Basophils Relative: 1 %
Eosinophils Absolute: 0 10*3/uL (ref 0.0–0.5)
Eosinophils Relative: 0 %
HCT: 40.4 % (ref 36.0–46.0)
Hemoglobin: 13.1 g/dL (ref 12.0–15.0)
Immature Granulocytes: 2 %
Lymphocytes Relative: 17 %
Lymphs Abs: 1.7 10*3/uL (ref 0.7–4.0)
MCH: 28.2 pg (ref 26.0–34.0)
MCHC: 32.4 g/dL (ref 30.0–36.0)
MCV: 86.9 fL (ref 80.0–100.0)
Monocytes Absolute: 0.8 10*3/uL (ref 0.1–1.0)
Monocytes Relative: 8 %
Neutro Abs: 7.3 10*3/uL (ref 1.7–7.7)
Neutrophils Relative %: 72 %
Platelets: 533 10*3/uL — ABNORMAL HIGH (ref 150–400)
RBC: 4.65 MIL/uL (ref 3.87–5.11)
RDW: 15.6 % — ABNORMAL HIGH (ref 11.5–15.5)
WBC: 10 10*3/uL (ref 4.0–10.5)
nRBC: 0 % (ref 0.0–0.2)

## 2019-09-05 LAB — C-REACTIVE PROTEIN: CRP: 1.3 mg/dL — ABNORMAL HIGH (ref <1.0)

## 2019-09-05 LAB — BRAIN NATRIURETIC PEPTIDE: B Natriuretic Peptide: 107.7 pg/mL — ABNORMAL HIGH (ref 0.0–100.0)

## 2019-09-05 LAB — MAGNESIUM: Magnesium: 2.2 mg/dL (ref 1.7–2.4)

## 2019-09-05 LAB — D-DIMER, QUANTITATIVE: D-Dimer, Quant: 1.44 ug/mL-FEU — ABNORMAL HIGH (ref 0.00–0.50)

## 2019-09-05 LAB — FERRITIN: Ferritin: 150 ng/mL (ref 11–307)

## 2019-09-05 MED ORDER — QUETIAPINE FUMARATE 25 MG PO TABS: 25.0000 mg | ORAL_TABLET | Freq: Every day | ORAL | 0 refills | Status: DC

## 2019-09-05 MED ORDER — NICOTINE 21 MG/24HR TD PT24: 21.0000 mg | MEDICATED_PATCH | Freq: Every day | TRANSDERMAL | 0 refills | Status: DC

## 2019-09-05 MED ORDER — INFLUENZA VAC SPLIT QUAD 0.5 ML IM SUSY
0.5000 mL | PREFILLED_SYRINGE | INTRAMUSCULAR | Status: AC | PRN
  Administered 2019-09-05: 0.5 mL via INTRAMUSCULAR
  Filled 2019-09-05: qty 0.5

## 2019-09-05 MED ORDER — GABAPENTIN 400 MG PO CAPS: 400.0000 mg | ORAL_CAPSULE | Freq: Three times a day (TID) | ORAL | 0 refills | Status: DC

## 2019-09-05 MED ORDER — PREDNISONE 10 MG PO TABS: ORAL_TABLET | ORAL | 0 refills | Status: AC

## 2019-09-05 MED ORDER — AMLODIPINE BESYLATE 10 MG PO TABS: 10.0000 mg | ORAL_TABLET | Freq: Every day | ORAL | 0 refills | Status: DC

## 2019-09-05 MED ORDER — ACETAMINOPHEN 325 MG PO TABS: 650.0000 mg | ORAL_TABLET | Freq: Four times a day (QID) | ORAL | Status: DC | PRN

## 2019-09-05 NOTE — Discharge Summary (Signed)
DISCHARGE SUMMARY  Emma Gould  MR#: [id number]  DOB:01/01/1956  Date of Admission: 08/31/2019 Date of Discharge: 09/05/2019  Attending Physician:Rogelio Waynick Silvestre Gunner Leani Myron, MD  Patient's NWG:NFAOZHPCP:System, Pcp Not In  Consults: None  Disposition: Discharge home  Follow-up Appts: see your primary care Physician within 5-7 days for a followup visit   Tests Needing Follow-up: -f/u on pulmonary exam / respiratory symptoms -assess BP control -inquire about continued tobacco abstinence  -consider outpt Psych referral for anxiety disorder   Discharge Diagnoses: COVID pneumonia Acute hypoxic respiratory failure Severe hypokalemia Generalized abdominal pain -COVID gastroenteritis Borderline prolonged QTC Uncontrolled HTN Severe generalized anxiety Tobacco abuse GERD  COVID-19 specific Treatment: Solu-Medrol 9/6 > Remdesivir 9/5 function> 9/09 Actemra 9/5  Initial presentation: 63 year old with a history of HTN, GERD, fibromyalgia, and migraine headaches who presented with fever chills abdominal pain and diarrhea.  In the ED she was found to be hypoxic and COVID positive with infiltrates on chest x-ray and confirmed on CT of chest.  She was admitted to the hospital for acute hypoxic respiratory failure.  Hospital Course:  COVID pneumonia - acute hypoxic respiratory failure Has improved rapidly with the use of Solu-Medrol, Remdesivir, and Actemra - weaned to room air at time of d/c home   Severe hypokalemia Corrected with supplementation - likely due to poor intake and GI losses  Generalized abdominal pain -COVID gastroenteritis Resolved at time of d/c   Borderline prolonged QTC Due to low potassium - corrected with replacement of potassium  Uncontrolled HTN Medical therapy has been adjusted during this hospital stay w/ BP controlled at time of d/c   Severe generalized anxiety Continue usual outpatient medications -would likely benefit from outpatient psychiatry eval/ongoing  treatment  Tobacco abuse Counseled patient on need to discontinue smoking  GERD Continue PPI  Allergies as of 09/05/2019   No Known Allergies     Medication List    STOP taking these medications   cloNIDine 0.1 MG tablet Commonly known as: CATAPRES   furosemide 20 MG tablet Commonly known as: LASIX     TAKE these medications   acetaminophen 325 MG tablet Commonly known as: TYLENOL Take 2 tablets (650 mg total) by mouth every 6 (six) hours as needed for mild pain or headache (fever >/= 101). What changed:   medication strength  how much to take  reasons to take this   albuterol 108 (90 Base) MCG/ACT inhaler Commonly known as: VENTOLIN HFA Inhale 2 puffs into the lungs 4 (four) times daily as needed for shortness of breath.   amLODipine 10 MG tablet Commonly known as: NORVASC Take 1 tablet (10 mg total) by mouth daily. Start taking on: September 06, 2019   escitalopram 20 MG tablet Commonly known as: LEXAPRO Take 20 mg by mouth daily.   gabapentin 400 MG capsule Commonly known as: NEURONTIN Take 1 capsule (400 mg total) by mouth 3 (three) times daily. What changed: how much to take   hydrOXYzine 50 MG tablet Commonly known as: ATARAX/VISTARIL Take 100 mg by mouth 3 (three) times daily.   nicotine 21 mg/24hr patch Commonly known as: NICODERM CQ - dosed in mg/24 hours Place 1 patch (21 mg total) onto the skin daily. Start taking on: September 06, 2019   omeprazole 40 MG capsule Commonly known as: PRILOSEC Take 40 mg by mouth daily.   predniSONE 10 MG tablet Commonly known as: DELTASONE Take 3 tablets (30 mg total) by mouth daily for 2 days, THEN 2 tablets (20 mg total) daily for 2  days, THEN 1 tablet (10 mg total) daily for 2 days. Start taking on: September 05, 2019   propranolol ER 80 MG 24 hr capsule Commonly known as: INDERAL LA Take 80 mg by mouth daily.   QUEtiapine 25 MG tablet Commonly known as: SEROQUEL Take 1 tablet (25 mg total) by  mouth at bedtime.   SUMAtriptan 50 MG tablet Commonly known as: IMITREX Take 50 mg by mouth as needed for migraine. Take a second tablet 2 hours later if needed; max 200mg /day   tiZANidine 4 MG tablet Commonly known as: ZANAFLEX Take 4 mg by mouth every 6 (six) hours as needed.   triamcinolone cream 0.1 % Commonly known as: KENALOG Apply 1 application topically See admin instructions. 2 to 3 times a day            Durable Medical Equipment  (From admission, onward)         Start     Ordered   09/05/19 1029  For home use only DME 4 wheeled rolling walker with seat  Once    Question:  Patient needs a walker to treat with the following condition  Answer:  Fear for personal safety   09/05/19 1029   09/05/19 1029  For home use only DME Bedside commode  Once    Question:  Patient needs a bedside commode to treat with the following condition  Answer:  Fear for personal safety   09/05/19 1029          Day of Discharge BP 130/62   Pulse 80   Temp 98.1 F (36.7 C) (Oral)   Resp 18   Ht 5\' 3"  (1.6 m)   Wt 68 kg   SpO2 100%   BMI 26.57 kg/m   Physical Exam: General: No acute respiratory distress Lungs: Clear to auscultation bilaterally without wheezes or crackles Cardiovascular: Regular rate and rhythm without murmur gallop or rub normal S1 and S2 Abdomen: Nontender, nondistended, soft, bowel sounds positive, no rebound, no ascites, no appreciable mass Extremities: No significant cyanosis, clubbing, or edema bilateral lower extremities  Basic Metabolic Panel: Recent Labs  Lab 09/01/19 0811 09/01/19 1500 09/02/19 0251 09/03/19 0225 09/04/19 0310 09/05/19 0255  NA 140 140 140 141 139 142  K 3.5 4.1 4.7 4.7 4.8 4.2  CL 104 107 106 109 109 102  CO2 24 22 22  20* 23 26  GLUCOSE 86 133* 120* 121* 124* 86  BUN 11 11 16 23  26* 30*  CREATININE 0.78 0.77 0.72 0.75 0.86 0.89  CALCIUM 9.0 8.5* 9.0 8.7* 8.5* 8.9  MG 2.6*  --  2.1 2.2 2.3 2.2  PHOS  --   --  3.8  --   --    --     Liver Function Tests: Recent Labs  Lab 09/01/19 0811 09/02/19 0251 09/03/19 0225 09/04/19 0310 09/05/19 0255  AST 27 20 16 19 30   ALT 19 16 13 15 22   ALKPHOS 87 89 72 69 68  BILITOT 0.6 0.4 0.1* 0.5 0.4  PROT 7.1 6.8 6.1* 6.1* 6.2*  ALBUMIN 2.6* 2.5* 2.3* 2.6* 2.7*   Recent Labs  Lab 08/31/19 2154  LIPASE 34   CBC: Recent Labs  Lab 08/31/19 2154 09/01/19 0811 09/02/19 0251 09/03/19 0225 09/04/19 0310 09/05/19 0255  WBC 10.9* 9.4 7.2 12.0* 11.0* 10.0  NEUTROABS 8.9*  --  5.7 10.1* 9.5* 7.3  HGB 14.3 14.7 12.9 12.6 12.6 13.1  HCT 42.3 44.7 39.9 37.8 38.5 40.4  MCV 85.1 86.6 87.1  87.5 87.9 86.9  PLT 505* 439* 415* 479* 474* 533*   BNP (last 3 results) Recent Labs    09/03/19 0425 09/04/19 0310 09/05/19 0255  BNP 264.6* 218.7* 107.7*    Recent Results (from the past 240 hour(s))  Blood Culture (routine x 2)     Status: None (Preliminary result)   Collection Time: 08/31/19  9:40 PM   Specimen: BLOOD  Result Value Ref Range Status   Specimen Description BLOOD RIGHT ANTECUBITAL  Final   Special Requests   Final    BOTTLES DRAWN AEROBIC AND ANAEROBIC Blood Culture adequate volume   Culture   Final    NO GROWTH 4 DAYS Performed at [facility], [address] [place], [place] [address]    Report Status PENDING  Incomplete  SARS Coronavirus 2 [facility](Hospital order, Performed in [facility] hospital lab) Nasopharyngeal Nasopharyngeal Swab     Status: Abnormal   Collection Time: 08/31/19  9:51 PM   Specimen: Nasopharyngeal Swab  Result Value Ref Range Status   SARS Coronavirus 2 POSITIVE (A) NEGATIVE Final    Comment: RESULT CALLED TO, READ BACK BY AND VERIFIED WITH: Neita GarnetS LOUDERMILK RN 08/31/19 2331 JDW (NOTE) If result is NEGATIVE SARS-CoV-2 target nucleic acids are NOT DETECTED. The SARS-CoV-2 RNA is generally detectable in upper and lower  respiratory specimens during the acute phase of infection. The lowest  concentration of SARS-CoV-2 viral  copies this assay can detect is 250  copies / mL. A negative result does not preclude SARS-CoV-2 infection  and should not be used as the sole basis for treatment or other  patient management decisions.  A negative result may occur with  improper specimen collection / handling, submission of specimen other  than nasopharyngeal swab, presence of viral mutation(s) within the  areas targeted by this assay, and inadequate number of viral copies  (<250 copies / mL). A negative result must be combined with clinical  observations, patient history, and epidemiological information. If result is POSITIVE SARS-CoV-2 target nucleic acids are DETECTED. The S ARS-CoV-2 RNA is generally detectable in upper and lower  respiratory specimens during the acute phase of infection.  Positive  results are indicative of active infection with SARS-CoV-2.  Clinical  correlation with patient history and other diagnostic information is  necessary to determine patient infection status.  Positive results do  not rule out bacterial infection or co-infection with other viruses. If result is PRESUMPTIVE POSTIVE SARS-CoV-2 nucleic acids MAY BE PRESENT.   A presumptive positive result was obtained on the submitted specimen  and confirmed on repeat testing.  While 2019 novel coronavirus  (SARS-CoV-2) nucleic acids may be present in the submitted sample  additional confirmatory testing may be necessary for epidemiological  and / or clinical management purposes  to differentiate between  SARS-CoV-2 and other Sarbecovirus currently known to infect humans.  If clinically indicated additional testing with an alternate test  methodology [phone]([id number]) is adv ised. The SARS-CoV-2 RNA is generally  detectable in upper and lower respiratory specimens during the acute  phase of infection. The expected result is Negative. Fact Sheet for Patients:  BoilerBrush.com.cyhttps://www.fda.gov/media/136312/download Fact Sheet for Healthcare  Providers: https://pope.com/https://www.fda.gov/media/136313/download This test is not yet approved or cleared by the [place] FDA and has been authorized for detection and/or diagnosis of SARS-CoV-2 by FDA under an Emergency Use Authorization (EUA).  This EUA will remain in effect (meaning this test can be used) for the duration of the COVID-19 declaration under Section 564(b)(1) of the  Act, 21 U.S.C. section 360bbb-3(b)(1), unless the authorization is terminated or revoked sooner. Performed at [facility], [place] [address], [place], [place] [address]   Blood Culture (routine x 2)     Status: None (Preliminary result)   Collection Time: 09/01/19 12:42 AM   Specimen: BLOOD RIGHT HAND  Result Value Ref Range Status   Specimen Description BLOOD RIGHT HAND  Final   Special Requests   Final    BOTTLES DRAWN AEROBIC AND ANAEROBIC Blood Culture adequate volume   Culture   Final    NO GROWTH 4 DAYS Performed at [facility], [place] [address], [place], [place] [address]    Report Status PENDING  Incomplete     Time spent in discharge (includes decision making & examination of pt): 35 minutes  09/05/2019, 6:51 PM   Cherene Altes, MD [facility] Office  [phone]

## 2019-09-05 NOTE — TOC Progression Note (Addendum)
Transition of Care [facility]) - Progression Note    Patient Details  Name: Emma Gould MRN: [id number] Date of Birth: 04-11-1956  Transition of Care [facility]) CM/SW Contact  Purcell Mouton, RN Phone Number: 09/05/2019, 10:25 AM  Clinical Narrative:    Pt will discharge home by [facility] and [place] at home with PT/OT. Pt may follow up with her PCP for [facility] if needed. No Agency have one available at present time. Also spoke with pt about her concerns. 1. There is no program to assist with her co-pay for her medications. Related to the pt having insurance. 2. Pt would like to keep her medications under $40.00 co pay.  DME 4 RW with seat and BSC will ship to pt's home and she is aware.         Expected Discharge Plan and Services                                                 Social Determinants of Health (SDOH) Interventions    Readmission Risk Interventions No flowsheet data found.

## 2019-09-05 NOTE — Progress Notes (Signed)
Patient discharged to home, AVS reviewed with patient and all questions answered. Prescriptions provided. IV's removed. Patient left via PTAR

## 2019-09-05 NOTE — Progress Notes (Signed)
Physical Therapy Treatment Patient Details Name: Emma BatonVickie Gagan MRN: [id number] DOB: 12/13/1956 Today's Date: 09/05/2019    History of Present Illness 63 y.o. female with medical history significant of hypertension, GERD, depression, migraine headache, tobacco abuse, fibromyalgia, who presents with fever, chills, shortness of breath, cough, abdominal pain, diarrhea. pt was found to have positive COVID-19; CT angiogram is negative for PE; admitted 09/01/19.     PT Comments    The patient is Improving with modified independence with mobility. Practiced with rollator in room and did well. Case management notified of equipment recs. Patient hopes to DC home today.  Follow Up Recommendations  Home health PT;Supervision - Intermittent     Equipment Recommendations  3in1 (PT)(rollator)    Recommendations for Other Services       Precautions / Restrictions Precautions Precautions: Fall Precaution Comments: reports recent fall in her bathroom--not sure what happened    Mobility  Bed Mobility Overal bed mobility: Modified Independent                Transfers   Equipment used: 4-wheeled walker   Sit to Stand: Supervision            Ambulation/Gait Ambulation/Gait assistance: Supervision Gait Distance (Feet): 40 Feet(x 2) Assistive device: 4-wheeled walker Gait Pattern/deviations: Step-through pattern;Decreased stride length;Wide base of support;Drifts right/left     General Gait Details: managed ambulation in room , into BR and back out with rollator.   Stairs             Wheelchair Mobility    Modified Rankin (Stroke Patients Only)       Balance Overall balance assessment: Mild deficits observed, not formally tested                                          Cognition Arousal/Alertness: Awake/alert                                            Exercises      General Comments        Pertinent Vitals/Pain Pain  Assessment: No/denies pain    Home Living                      Prior Function            PT Goals (current goals can now be found in the care plan section)      Frequency    Min 3X/week      PT Plan Current plan remains appropriate    Co-evaluation              AM-PAC PT "6 Clicks" Mobility   Outcome Measure  Help needed turning from your back to your side while in a flat bed without using bedrails?: None Help needed moving from lying on your back to sitting on the side of a flat bed without using bedrails?: None Help needed moving to and from a bed to a chair (including a wheelchair)?: A Little Help needed standing up from a chair using your arms (e.g., wheelchair or bedside chair)?: A Little Help needed to walk in hospital room?: A Little Help needed climbing 3-5 steps with a railing? : A Little 6 Click Score: 20    End of Session  Activity Tolerance: Patient tolerated treatment well Patient left: in bed;with call bell/phone within reach Nurse Communication: Mobility status PT Visit Diagnosis: Unsteadiness on feet (R26.81);Muscle weakness (generalized) (M62.81)     Time: 2778-2423 PT Time Calculation (min) (ACUTE ONLY): 24 min  Charges:  $Gait Training: 23-37 mins                     [place] Pager [phone] Office [phone]    Claretha Cooper 09/05/2019, 1:17 PM

## 2019-09-07 LAB — CULTURE, BLOOD (ROUTINE X 2)
Culture: NO GROWTH
Special Requests: ADEQUATE

## 2019-09-10 LAB — CULTURE, BLOOD (ROUTINE X 2)
Culture: NO GROWTH
Special Requests: ADEQUATE

## 2019-09-18 ENCOUNTER — Emergency Department (HOSPITAL_BASED_OUTPATIENT_CLINIC_OR_DEPARTMENT_OTHER): Payer: Medicare Other

## 2019-09-18 ENCOUNTER — Emergency Department (HOSPITAL_COMMUNITY)
Admission: EM | Admit: 2019-09-18 | Discharge: 2019-09-18 | Discharge status: Against Medical Advice | Payer: Medicare Other | Attending: Emergency Medicine | Admitting: Emergency Medicine

## 2019-09-18 ENCOUNTER — Other Ambulatory Visit: Payer: Self-pay

## 2019-09-18 ENCOUNTER — Emergency Department (HOSPITAL_COMMUNITY): Payer: Medicare Other

## 2019-09-18 DIAGNOSIS — U071 COVID-19: Secondary | ICD-10-CM | POA: Insufficient documentation

## 2019-09-18 DIAGNOSIS — M797 Fibromyalgia: Secondary | ICD-10-CM | POA: Insufficient documentation

## 2019-09-18 DIAGNOSIS — R0902 Hypoxemia: Secondary | ICD-10-CM | POA: Diagnosis not present

## 2019-09-18 DIAGNOSIS — M79671 Pain in right foot: Secondary | ICD-10-CM | POA: Diagnosis present

## 2019-09-18 DIAGNOSIS — R2241 Localized swelling, mass and lump, right lower limb: Secondary | ICD-10-CM | POA: Insufficient documentation

## 2019-09-18 DIAGNOSIS — F17210 Nicotine dependence, cigarettes, uncomplicated: Secondary | ICD-10-CM | POA: Insufficient documentation

## 2019-09-18 DIAGNOSIS — R609 Edema, unspecified: Secondary | ICD-10-CM

## 2019-09-18 DIAGNOSIS — S93491A Sprain of other ligament of right ankle, initial encounter: Secondary | ICD-10-CM

## 2019-09-18 DIAGNOSIS — J1289 Other viral pneumonia: Secondary | ICD-10-CM | POA: Diagnosis not present

## 2019-09-18 DIAGNOSIS — J189 Pneumonia, unspecified organism: Secondary | ICD-10-CM

## 2019-09-18 DIAGNOSIS — M79609 Pain in unspecified limb: Secondary | ICD-10-CM | POA: Diagnosis not present

## 2019-09-18 DIAGNOSIS — Z79899 Other long term (current) drug therapy: Secondary | ICD-10-CM | POA: Diagnosis not present

## 2019-09-18 DIAGNOSIS — M7989 Other specified soft tissue disorders: Secondary | ICD-10-CM

## 2019-09-18 DIAGNOSIS — I10 Essential (primary) hypertension: Secondary | ICD-10-CM | POA: Diagnosis not present

## 2019-09-18 LAB — COMPREHENSIVE METABOLIC PANEL
ALT: 15 U/L (ref 0–44)
AST: 17 U/L (ref 15–41)
Albumin: 3.3 g/dL — ABNORMAL LOW (ref 3.5–5.0)
Alkaline Phosphatase: 69 U/L (ref 38–126)
Anion gap: 9 (ref 5–15)
BUN: 15 mg/dL (ref 8–23)
CO2: 29 mmol/L (ref 22–32)
Calcium: 8.6 mg/dL — ABNORMAL LOW (ref 8.9–10.3)
Chloride: 98 mmol/L (ref 98–111)
Creatinine, Ser: 0.79 mg/dL (ref 0.44–1.00)
GFR calc Af Amer: >60 mL/min (ref >60)
GFR calc non Af Amer: >60 mL/min (ref >60)
Glucose, Bld: 95 mg/dL (ref 70–99)
Potassium: 3.6 mmol/L (ref 3.5–5.1)
Sodium: 136 mmol/L (ref 135–145)
Total Bilirubin: 0.7 mg/dL (ref 0.3–1.2)
Total Protein: 6.1 g/dL — ABNORMAL LOW (ref 6.5–8.1)

## 2019-09-18 LAB — FIBRINOGEN: Fibrinogen: 382 mg/dL (ref 210–475)

## 2019-09-18 LAB — CBC WITH DIFFERENTIAL/PLATELET
Abs Immature Granulocytes: 0.44 10*3/uL — ABNORMAL HIGH (ref 0.00–0.07)
Basophils Absolute: 0.1 10*3/uL (ref 0.0–0.1)
Basophils Relative: 0 %
Eosinophils Absolute: 0 10*3/uL (ref 0.0–0.5)
Eosinophils Relative: 0 %
HCT: 38.1 % (ref 36.0–46.0)
Hemoglobin: 12.6 g/dL (ref 12.0–15.0)
Immature Granulocytes: 2 %
Lymphocytes Relative: 10 %
Lymphs Abs: 2.2 10*3/uL (ref 0.7–4.0)
MCH: 30 pg (ref 26.0–34.0)
MCHC: 33.1 g/dL (ref 30.0–36.0)
MCV: 90.7 fL (ref 80.0–100.0)
Monocytes Absolute: 0.8 10*3/uL (ref 0.1–1.0)
Monocytes Relative: 4 %
Neutro Abs: 18.9 10*3/uL — ABNORMAL HIGH (ref 1.7–7.7)
Neutrophils Relative %: 84 %
Platelets: 163 10*3/uL (ref 150–400)
RBC: 4.2 MIL/uL (ref 3.87–5.11)
RDW: 16.5 % — ABNORMAL HIGH (ref 11.5–15.5)
WBC: 22.5 10*3/uL — ABNORMAL HIGH (ref 4.0–10.5)
nRBC: 0 % (ref 0.0–0.2)

## 2019-09-18 LAB — TRIGLYCERIDES: Triglycerides: 204 mg/dL — ABNORMAL HIGH (ref <150)

## 2019-09-18 LAB — PROCALCITONIN: Procalcitonin: 0.31 ng/mL

## 2019-09-18 LAB — D-DIMER, QUANTITATIVE: D-Dimer, Quant: 1.1 ug/mL-FEU — ABNORMAL HIGH (ref 0.00–0.50)

## 2019-09-18 LAB — LACTATE DEHYDROGENASE: LDH: 261 U/L — ABNORMAL HIGH (ref 98–192)

## 2019-09-18 LAB — C-REACTIVE PROTEIN: CRP: 4.9 mg/dL — ABNORMAL HIGH (ref <1.0)

## 2019-09-18 LAB — LACTIC ACID, PLASMA: Lactic Acid, Venous: 0.9 mmol/L (ref 0.5–1.9)

## 2019-09-18 LAB — FERRITIN: Ferritin: 121 ng/mL (ref 11–307)

## 2019-09-18 LAB — BRAIN NATRIURETIC PEPTIDE: B Natriuretic Peptide: 83.5 pg/mL (ref 0.0–100.0)

## 2019-09-18 LAB — SARS CORONAVIRUS 2 BY RT PCR (HOSPITAL ORDER, PERFORMED IN ~~LOC~~ HOSPITAL LAB): SARS Coronavirus 2: POSITIVE — AB

## 2019-09-18 MED ORDER — SODIUM CHLORIDE 0.9 % IV SOLN
2.0000 g | Freq: Once | INTRAVENOUS | Status: AC
  Administered 2019-09-18: 2 g via INTRAVENOUS
  Filled 2019-09-18: qty 2

## 2019-09-18 MED ORDER — DIPHENHYDRAMINE HCL 50 MG/ML IJ SOLN
25.0000 mg | Freq: Once | INTRAMUSCULAR | Status: AC
  Administered 2019-09-18: 17:00:00 25 mg via INTRAVENOUS
  Filled 2019-09-18: qty 1

## 2019-09-18 MED ORDER — PROCHLORPERAZINE EDISYLATE 10 MG/2ML IJ SOLN
10.0000 mg | Freq: Once | INTRAMUSCULAR | Status: AC
  Administered 2019-09-18: 17:00:00 10 mg via INTRAVENOUS
  Filled 2019-09-18: qty 2

## 2019-09-18 MED ORDER — VANCOMYCIN HCL 10 G IV SOLR
1500.0000 mg | Freq: Once | INTRAVENOUS | Status: AC
  Administered 2019-09-18: 1500 mg via INTRAVENOUS
  Filled 2019-09-18: qty 1500

## 2019-09-18 MED ORDER — LEVOFLOXACIN 750 MG PO TABS: 750.0000 mg | ORAL_TABLET | Freq: Every day | ORAL | 0 refills | Status: DC

## 2019-09-18 MED ORDER — ONDANSETRON HCL 4 MG/2ML IJ SOLN
4.0000 mg | Freq: Once | INTRAMUSCULAR | Status: AC
  Administered 2019-09-18: 4 mg via INTRAVENOUS
  Filled 2019-09-18: qty 2

## 2019-09-18 MED ORDER — OXYCODONE-ACETAMINOPHEN 5-325 MG PO TABS
1.0000 | ORAL_TABLET | Freq: Once | ORAL | Status: AC
  Administered 2019-09-18: 1 via ORAL
  Filled 2019-09-18: qty 1

## 2019-09-18 NOTE — Progress Notes (Signed)
Right lower extremity venous duplex completed. Preliminary results in Chart review CV Proc. [facility], [facility] 09/18/2019,4:49 PM

## 2019-09-18 NOTE — ED Provider Notes (Addendum)
I received this patient in signout from Dr. Billy Fischer.  Briefly, she had presented with ankle pain after likely ankle sprain but was incidentally found to have hypoxia.  Recent discharge from [facility] after COVID-19 and infection.  At time of signout, awaiting completion of lab work and admission for hypoxia.  Her lab work was notable for WBC 82.8 with neutrophilic predominance, downtrending LDH and d-dimer, normal ferritin and fibrinogen, elevated CRP at 4.9.  Chest x-ray showed right-sided opacity concerning for pneumonia.  I suspect she may have a superimposed HCAP given her leukocytosis.  Given appearance of chest x-ray and leukocytosis, PE seems less likely especially given negative lower extremity ultrasounds.  She has received vancomycin and cefepime.  Discussed admission with [facility], Dr. Marthenia Rolling.  He went to admit the patient and she requested to leave.  He had a long discussion with her regarding the risks of leaving but she was adamant about signing out [place].  I reiterated our concerns and she voiced understanding of risks of leaving including worsening condition or even death.  She refuses to be admitted here and will follow up with her PCP.  I have sent prescription for Levaquin to her pharmacy and emphasized the importance of return to a medical facility if any of her symptoms worsen.  She is awake and alert at the time that PTAR has arrived to transport her and she appears to possess decision-making capacity.    Little, Wenda Overland, MD 09/18/19 2226

## 2019-09-18 NOTE — ED Triage Notes (Signed)
The patient is from home where she was in quarantine (2 days left) post discharge from [facility] with a covid positive diagnosis. While at home the patient fell and injured her foot 2 days ago. EMS reported swelling in the right foot. Her MD told her to come to the hospital to check for blood clots. She complains of dry cough, headaches, shortness of breath, sore throat and heat sensitivity. While on the scene, EMS noted her O2 levels were at 87%. O2 saturation increased to 96% with 4L of O2.   EMS vitals: 138/80 BP 92 HE 18 RR 96% O2 sat on 4L O2  Nasal canula

## 2019-09-18 NOTE — ED Notes (Signed)
Pt c/o right foot, ankle, shin pain since falling on Sunday.  Pt reports she did feel a pop when she fell, also reports decreased sensation in right foot.  Pt also c/o sore throat, severe headache, nausea but no emesis.   Pt assisted into hospital gown, out of wheelchair and into bed.  Pt able to stand and pivot using left foot.

## 2019-09-18 NOTE — ED Provider Notes (Signed)
[facility]-EMERGENCY DEPT Provider Note   CSN: [id number] Arrival date & time: 09/18/19  1248     History   Chief Complaint Chief Complaint  Patient presents with  . Foot Pain  . Foot Swelling    HPI Rondel BatonVickie Lavoy is a 63 y.o. female.     HPI   63 year old female with history of hypertension, recent admission to [facility] with COVID-19 infection, presents with concern for right proximal foot and ankle pain after twisting her ankle, with extension of pain to her left lower leg.  Patient was found to be hypoxic by EMS with saturations 87% on room air.  She reports that she is here for the foot, ankle and leg pain, but does acknowledge that she is continue to have some symptoms since discharge from the hospital.  She denies feeling short of breath at rest, but does report some dyspnea on exertion.  She also reports having nausea and vomiting yesterday, as well as a dry cough and sore throat.  She has not had fevers that she knows of.  Reports that her physician had sent her to the emergency department to have a DVT study for her leg pain.  Reports that she twisted her ankle 2 days ago.  Denies any other trauma, including no head trauma/LOC or injury.  Past Medical History:  Diagnosis Date  . Fibromyalgia   . Hypertension     Patient Active Problem List   Diagnosis Date Noted  . Diarrhea 09/01/2019  . Abdominal pain 09/01/2019  . Hypertension   . GERD (gastroesophageal reflux disease)   . Depression   . Migraine   . Tobacco abuse   . Hypokalemia   . Acute respiratory disease due to COVID-19 virus     Past Surgical History:  Procedure Laterality Date  . CERVICAL FUSION       OB History   No obstetric history on file.      Home Medications    Prior to Admission medications   Medication Sig Start Date End Date Taking? Authorizing Provider  acetaminophen (TYLENOL) 325 MG tablet Take 2 tablets (650 mg total) by mouth every 6 (six)  hours as needed for mild pain or headache (fever >/= 101). 09/05/19   Lonia BloodMcClung, Jeffrey T, MD  albuterol (VENTOLIN HFA) 108 (90 Base) MCG/ACT inhaler Inhale 2 puffs into the lungs 4 (four) times daily as needed for shortness of breath.    [provider]  amLODipine (NORVASC) 10 MG tablet Take 1 tablet (10 mg total) by mouth daily. 09/06/19   Lonia BloodMcClung, Jeffrey T, MD  escitalopram (LEXAPRO) 20 MG tablet Take 20 mg by mouth daily. 08/07/19   [provider]  gabapentin (NEURONTIN) 400 MG capsule Take 1 capsule (400 mg total) by mouth 3 (three) times daily. 09/05/19   Lonia BloodMcClung, Jeffrey T, MD  hydrOXYzine (ATARAX/VISTARIL) 50 MG tablet Take 100 mg by mouth 3 (three) times daily. 04/25/19   [provider]  nicotine (NICODERM CQ - DOSED IN MG/24 HOURS) 21 mg/24hr patch Place 1 patch (21 mg total) onto the skin daily. 09/06/19   Lonia BloodMcClung, Jeffrey T, MD  omeprazole (PRILOSEC) 40 MG capsule Take 40 mg by mouth daily. 06/11/19   [provider]  propranolol ER (INDERAL LA) 80 MG 24 hr capsule Take 80 mg by mouth daily. 06/11/19   [provider]  QUEtiapine (SEROQUEL) 25 MG tablet Take 1 tablet (25 mg total) by mouth at bedtime. 09/05/19   Lonia BloodMcClung, Jeffrey T,  MD  SUMAtriptan (IMITREX) 50 MG tablet Take 50 mg by mouth as needed for migraine. Take a second tablet 2 hours later if needed; max 200mg /day 12/21/18   [provider]  triamcinolone cream (KENALOG) 0.1 % Apply 1 application topically See admin instructions. 2 to 3 times a day 08/06/19   [provider]    Family History Family History  Problem Relation Age of Onset  . Stroke Mother   . Hypertension Mother   . Lung cancer Father        Father died of lung cancer    Social History Social History   Tobacco Use  . Smoking status: Current Every Day Smoker  . Smokeless tobacco: Never Used  Substance Use Topics  . Alcohol use: Never    Frequency: Never  . Drug use: Never     Allergies    Patient has no known allergies.   Review of Systems Review of Systems  Constitutional: Positive for fatigue. Negative for fever.  HENT: Positive for sore throat.   Eyes: Negative for visual disturbance.  Respiratory: Positive for cough. Negative for shortness of breath.   Cardiovascular: Negative for chest pain.  Gastrointestinal: Positive for nausea and vomiting. Negative for abdominal pain.  Genitourinary: Negative for difficulty urinating.  Musculoskeletal: Positive for arthralgias, gait problem and myalgias. Negative for back pain and neck pain.  Skin: Negative for rash.  Neurological: Positive for headaches. Negative for syncope.     Physical Exam Updated Vital Signs BP (!) 98/50   Pulse 78   Temp 98.7 F (37.1 C) (Oral)   Resp 17   Ht 5\' 3"  (1.6 m)   Wt 68 kg   SpO2 96%   BMI 26.57 kg/m   Physical Exam Vitals signs and nursing note reviewed.  Constitutional:      General: She is not in acute distress.    Appearance: She is well-developed. She is not diaphoretic.  HENT:     Head: Normocephalic and atraumatic.  Eyes:     Conjunctiva/sclera: Conjunctivae normal.  Neck:     Musculoskeletal: Normal range of motion.  Cardiovascular:     Rate and Rhythm: Normal rate and regular rhythm.     Heart sounds: Normal heart sounds. No murmur. No friction rub. No gallop.   Pulmonary:     Effort: Pulmonary effort is normal. No respiratory distress.     Breath sounds: Normal breath sounds. No wheezing or rales.  Abdominal:     General: There is no distension.     Palpations: Abdomen is soft.     Tenderness: There is no abdominal tenderness. There is no guarding.  Musculoskeletal:     Right ankle: She exhibits swelling. She exhibits normal pulse. Tenderness. AITFL tenderness found. No head of 5th metatarsal and no proximal fibula tenderness found.  Skin:    General: Skin is warm and dry.     Findings: No erythema or rash.  Neurological:     Mental Status: She is alert  and oriented to person, place, and time.      ED Treatments / Results  Labs (all labs ordered are listed, but only abnormal results are displayed) Labs Reviewed  CBC WITH DIFFERENTIAL/PLATELET - Abnormal; Notable for the following components:      Result Value   WBC 22.5 (*)    RDW 16.5 (*)    Neutro Abs 18.9 (*)    Abs Immature Granulocytes 0.44 (*)    All other components within normal limits  COMPREHENSIVE  METABOLIC PANEL - Abnormal; Notable for the following components:   Calcium 8.6 (*)    Total Protein 6.1 (*)    Albumin 3.3 (*)    All other components within normal limits  D-DIMER, QUANTITATIVE (NOT AT [facility]) - Abnormal; Notable for the following components:   D-Dimer, Quant 1.10 (*)    All other components within normal limits  LACTATE DEHYDROGENASE - Abnormal; Notable for the following components:   LDH 261 (*)    All other components within normal limits  TRIGLYCERIDES - Abnormal; Notable for the following components:   Triglycerides 204 (*)    All other components within normal limits  C-REACTIVE PROTEIN - Abnormal; Notable for the following components:   CRP 4.9 (*)    All other components within normal limits  SARS CORONAVIRUS 2 (HOSPITAL ORDER, [facility])  CULTURE, BLOOD (ROUTINE X 2)  CULTURE, BLOOD (ROUTINE X 2)  LACTIC ACID, PLASMA  PROCALCITONIN  FERRITIN  FIBRINOGEN  BRAIN NATRIURETIC PEPTIDE  LACTIC ACID, PLASMA    EKG None  Radiology Dg Chest Port 1 View  Result Date: 09/18/2019 CLINICAL DATA:  Hypoxia. EXAM: PORTABLE CHEST 1 VIEW COMPARISON:  Radiograph of September 03, 2019. FINDINGS: Stable cardiomediastinal silhouette. No pneumothorax or pleural effusion is noted. Left lung is clear. New large peripheral airspace opacity is noted in the right lung concerning for pneumonia. Bony thorax is unremarkable. IMPRESSION: New large peripheral right lung airspace opacity is noted concerning for pneumonia. Followup PA and  lateral chest X-ray is recommended in 3-4 weeks following trial of antibiotic therapy to ensure resolution and exclude underlying malignancy. Electronically Signed   By: Marijo Conception M.D.   On: 09/18/2019 16:44   Dg Ankle Right Port  Result Date: 09/18/2019 CLINICAL DATA:  Right ankle pain after fall 2 days ago. EXAM: PORTABLE RIGHT ANKLE - 2 VIEW COMPARISON:  None. FINDINGS: There is no evidence of fracture, dislocation, or joint effusion. There is no evidence of arthropathy or other focal bone abnormality. Soft tissues are unremarkable. IMPRESSION: Negative. Electronically Signed   By: Marijo Conception M.D.   On: 09/18/2019 16:43   Vas [place] Lower Extremity Venous (dvt) (only [facility])  Result Date: 09/18/2019  Lower Venous Study Indications: Pain, and Swelling.  Risk Factors: Trauma Injury to ankle an foot from [place] three days ago. COVID patient. Comparison Study: No previous study available Performing Technologist: Toma Copier RVS  Examination Guidelines: A complete evaluation includes B-mode imaging, spectral Doppler, color Doppler, and power Doppler as needed of all accessible portions of each vessel. Bilateral testing is considered an integral part of a complete examination. Limited examinations for reoccurring indications may be performed as noted.  +---------+---------------+---------+-----------+----------+-------------------+ RIGHT    CompressibilityPhasicitySpontaneityPropertiesThrombus Aging      +---------+---------------+---------+-----------+----------+-------------------+ CFV      Full           Yes      Yes                                      +---------+---------------+---------+-----------+----------+-------------------+ SFJ      Full                                                             +---------+---------------+---------+-----------+----------+-------------------+  FV Prox  Full           Yes      Yes                                       +---------+---------------+---------+-----------+----------+-------------------+ FV Mid   Full                                                             +---------+---------------+---------+-----------+----------+-------------------+ FV DistalFull           Yes      Yes                                      +---------+---------------+---------+-----------+----------+-------------------+ PFV      Full           Yes      Yes                                      +---------+---------------+---------+-----------+----------+-------------------+ POP      Full           Yes      Yes                                      +---------+---------------+---------+-----------+----------+-------------------+ PTV      Full                                                             +---------+---------------+---------+-----------+----------+-------------------+ PERO     Full                                         Difficult to iage                                                         due to swelling     +---------+---------------+---------+-----------+----------+-------------------+   +----+---------------+---------+-----------+----------+--------------+ LEFTCompressibilityPhasicitySpontaneityPropertiesThrombus Aging +----+---------------+---------+-----------+----------+--------------+ CFV Full           Yes      Yes                                 +----+---------------+---------+-----------+----------+--------------+ SFJ Full                                                        +----+---------------+---------+-----------+----------+--------------+  Summary: Right: There is no evidence of deep vein thrombosis in the lower extremity. No cystic structure found in the popliteal fossa. Left: There is no evidence of a common femoral vein obstruction.  *See table(s) above for measurements and observations.    Preliminary     Procedures Procedures (including  critical care time)  Medications Ordered in ED Medications  ceFEPIme (MAXIPIME) 2 g in sodium chloride 0.9 % 100 mL IVPB (2 g Intravenous New Bag/Given 09/18/19 1754)  vancomycin (VANCOCIN) 1,500 mg in sodium chloride 0.9 % 500 mL IVPB (has no administration in time range)  ondansetron (ZOFRAN) injection 4 mg (4 mg Intravenous Given 09/18/19 1653)  oxyCODONE-acetaminophen (PERCOCET/ROXICET) 5-325 MG per tablet 1 tablet (1 tablet Oral Given 09/18/19 1658)  prochlorperazine (COMPAZINE) injection 10 mg (10 mg Intravenous Given 09/18/19 1653)  diphenhydrAMINE (BENADRYL) injection 25 mg (25 mg Intravenous Given 09/18/19 1653)     Initial Impression / Assessment and Plan / ED Course  I have reviewed the triage vital signs and the nursing notes.  Pertinent labs & imaging results that were available during my care of the patient were reviewed by me and considered in my medical decision making (see chart for details).        63 year old female with history of hypertension, recent admission to [facility] with COVID-19 infection, presents with concern for right proximal foot and ankle pain after twisting her ankle, with extension of pain to her left lower leg.  Patient was found to be hypoxic by EMS with saturations 87% on room air.   X-rays of the ankle show no sign of fracture.  DVT study is negative.  Suspect most likely cause of her ankle pain is ankle sprain.  Ordered Aircast and crutches, patient may be weightbearing as tolerated.  More acutely concerning today, is patient's hypoxia which was incidentally found on vital signs, although she does no other associated symptoms such as cough, nausea and vomiting.  Her oxygen saturations are down to 84% on room air.  Differential diagnosis includes continuing COVID-19 infection, CHF, pneumonia, PE.  Order COVID-19 related labs.  Chest x-ray does show concern for pneumonia.  Ordered vancomycin and cefepime for H CAP, awaiting other COVID  testing to determine plan of care for location for admission. Care signed out to Dr. Clarene Duke with labs pending.    Deslyn Schmelter was evaluated in Emergency Department on 09/18/2019 for the symptoms described in the history of present illness. She was evaluated in the context of the global COVID-19 pandemic, which necessitated consideration that the patient might be at risk for infection with the SARS-CoV-2 virus that causes COVID-19. Institutional protocols and algorithms that pertain to the evaluation of patients at risk for COVID-19 are in a state of rapid change based on information released by regulatory bodies including the CDC and federal and state organizations. These policies and algorithms were followed during the patient's care in the ED.   Final Clinical Impressions(s) / ED Diagnoses   Final diagnoses:  Swelling  Hypoxia  Sprain of anterior talofibular ligament of right ankle, initial encounter  HCAP (healthcare-associated pneumonia)    ED Discharge Orders    None       Alvira Monday, MD 09/18/19 [phone]

## 2019-09-18 NOTE — ED Notes (Signed)
[facility] arrived for transport 

## 2019-09-18 NOTE — ED Notes (Addendum)
Pt is requesting to leave AMA. MD made aware. Pt stated she did not want to finish her IV antibiotics and wants go home at this time. Pt was educated on her current diagnosis and her plan of care and still wishes to leave at this time.

## 2019-09-18 NOTE — ED Notes (Signed)
This nurse spoke with [facility] and explained to the patient that they are willing to take her back but that insurance may not cover the expense. Pt verbalized understanding and states she does not want to go back to [facility]. This nurse educated her the risks for leaving AMA and that she can return here at any time.

## 2019-09-18 NOTE — Progress Notes (Signed)
A consult was received from an ED physician for vancomycin per pharmacy dosing.  The patient's profile has been reviewed for ht/wt/allergies/indication/available labs.   A one time order has been placed for vancomycin 1500 mg.    Further antibiotics/pharmacy consults should be ordered by admitting physician if indicated.                       Thank you,  Eudelia Bunch, Pharm.D [phone] 09/18/2019 5:14 PM

## 2019-09-23 LAB — CULTURE, BLOOD (ROUTINE X 2)
Culture: NO GROWTH
Culture: NO GROWTH
Special Requests: ADEQUATE
Special Requests: ADEQUATE

## 2020-02-21 ENCOUNTER — Emergency Department (HOSPITAL_BASED_OUTPATIENT_CLINIC_OR_DEPARTMENT_OTHER): Payer: Medicare Other

## 2020-02-21 ENCOUNTER — Encounter (HOSPITAL_BASED_OUTPATIENT_CLINIC_OR_DEPARTMENT_OTHER): Payer: Self-pay | Admitting: Emergency Medicine

## 2020-02-21 ENCOUNTER — Other Ambulatory Visit: Payer: Self-pay

## 2020-02-21 ENCOUNTER — Emergency Department (HOSPITAL_BASED_OUTPATIENT_CLINIC_OR_DEPARTMENT_OTHER)
Admission: EM | Admit: 2020-02-21 | Discharge: 2020-02-21 | Disposition: A | Discharge status: Home / Self Care | Payer: Medicare Other | Attending: Emergency Medicine | Admitting: Emergency Medicine

## 2020-02-21 DIAGNOSIS — Z8616 Personal history of COVID-19: Secondary | ICD-10-CM | POA: Insufficient documentation

## 2020-02-21 DIAGNOSIS — F17210 Nicotine dependence, cigarettes, uncomplicated: Secondary | ICD-10-CM | POA: Insufficient documentation

## 2020-02-21 DIAGNOSIS — R1031 Right lower quadrant pain: Secondary | ICD-10-CM | POA: Insufficient documentation

## 2020-02-21 DIAGNOSIS — Z79899 Other long term (current) drug therapy: Secondary | ICD-10-CM | POA: Insufficient documentation

## 2020-02-21 DIAGNOSIS — I10 Essential (primary) hypertension: Secondary | ICD-10-CM | POA: Diagnosis not present

## 2020-02-21 DIAGNOSIS — W19XXXS Unspecified fall, sequela: Secondary | ICD-10-CM | POA: Insufficient documentation

## 2020-02-21 DIAGNOSIS — R109 Unspecified abdominal pain: Secondary | ICD-10-CM

## 2020-02-21 DIAGNOSIS — W19XXXA Unspecified fall, initial encounter: Secondary | ICD-10-CM

## 2020-02-21 LAB — COMPREHENSIVE METABOLIC PANEL
ALT: 10 U/L (ref 0–44)
AST: 14 U/L — ABNORMAL LOW (ref 15–41)
Albumin: 3.7 g/dL (ref 3.5–5.0)
Alkaline Phosphatase: 66 U/L (ref 38–126)
Anion gap: 7 (ref 5–15)
BUN: 10 mg/dL (ref 8–23)
CO2: 25 mmol/L (ref 22–32)
Calcium: 8.9 mg/dL (ref 8.9–10.3)
Chloride: 107 mmol/L (ref 98–111)
Creatinine, Ser: 0.98 mg/dL (ref 0.44–1.00)
GFR calc Af Amer: >60 mL/min (ref >60)
GFR calc non Af Amer: >60 mL/min (ref >60)
Glucose, Bld: 94 mg/dL (ref 70–99)
Potassium: 4 mmol/L (ref 3.5–5.1)
Sodium: 139 mmol/L (ref 135–145)
Total Bilirubin: 0.4 mg/dL (ref 0.3–1.2)
Total Protein: 6.6 g/dL (ref 6.5–8.1)

## 2020-02-21 LAB — CBC WITH DIFFERENTIAL/PLATELET
Abs Immature Granulocytes: 0.02 10*3/uL (ref 0.00–0.07)
Basophils Absolute: 0.1 10*3/uL (ref 0.0–0.1)
Basophils Relative: 1 %
Eosinophils Absolute: 0.1 10*3/uL (ref 0.0–0.5)
Eosinophils Relative: 1 %
HCT: 37.1 % (ref 36.0–46.0)
Hemoglobin: 12.4 g/dL (ref 12.0–15.0)
Immature Granulocytes: 0 %
Lymphocytes Relative: 40 %
Lymphs Abs: 2.1 10*3/uL (ref 0.7–4.0)
MCH: 32.4 pg (ref 26.0–34.0)
MCHC: 33.4 g/dL (ref 30.0–36.0)
MCV: 96.9 fL (ref 80.0–100.0)
Monocytes Absolute: 0.5 10*3/uL (ref 0.1–1.0)
Monocytes Relative: 10 %
Neutro Abs: 2.5 10*3/uL (ref 1.7–7.7)
Neutrophils Relative %: 48 %
Platelets: 241 10*3/uL (ref 150–400)
RBC: 3.83 MIL/uL — ABNORMAL LOW (ref 3.87–5.11)
RDW: 14.7 % (ref 11.5–15.5)
WBC: 5.2 10*3/uL (ref 4.0–10.5)
nRBC: 0 % (ref 0.0–0.2)

## 2020-02-21 LAB — URINALYSIS, ROUTINE W REFLEX MICROSCOPIC
Bilirubin Urine: NEGATIVE
Glucose, UA: NEGATIVE mg/dL
Hgb urine dipstick: NEGATIVE
Ketones, ur: NEGATIVE mg/dL
Leukocytes,Ua: NEGATIVE
Nitrite: NEGATIVE
Protein, ur: NEGATIVE mg/dL
Specific Gravity, Urine: <1.005 — ABNORMAL LOW (ref 1.005–1.030)
pH: 6.5 (ref 5.0–8.0)

## 2020-02-21 LAB — LIPASE, BLOOD: Lipase: 29 U/L (ref 11–51)

## 2020-02-21 MED ORDER — DICYCLOMINE HCL 20 MG PO TABS: 20.0000 mg | ORAL_TABLET | Freq: Two times a day (BID) | ORAL | 0 refills | Status: DC

## 2020-02-21 MED ORDER — IOHEXOL 300 MG/ML  SOLN
100.0000 mL | Freq: Once | INTRAMUSCULAR | Status: AC | PRN
  Administered 2020-02-21: 16:00:00 100 mL via INTRAVENOUS

## 2020-02-21 MED ORDER — SODIUM CHLORIDE 0.9 % IV BOLUS
500.0000 mL | Freq: Once | INTRAVENOUS | Status: AC
  Administered 2020-02-21: 17:00:00 500 mL via INTRAVENOUS

## 2020-02-21 MED ORDER — SODIUM CHLORIDE 0.9 % IV BOLUS
500.0000 mL | Freq: Once | INTRAVENOUS | Status: AC
  Administered 2020-02-21: 500 mL via INTRAVENOUS

## 2020-02-21 NOTE — Discharge Instructions (Addendum)
You have been diagnosed today with right-sided abdominal pain.  At this time there does not appear to be the presence of an emergent medical condition, however there is always the potential for conditions to change. Please read and follow the below instructions.  Please return to the Emergency Department immediately for any new or worsening symptoms. Please be sure to follow up with your Primary Care Provider within one week regarding your visit today; please call their office to schedule an appointment even if you are feeling better for a follow-up visit. You may take the medication Bentyl as prescribed to help with your symptoms.  Please drink plenty water and get plenty of rest.  Please see your primary care provider within 1 week for recheck. You may call the orthopedic specialist Dr. Everardo Pacific on your discharge paperwork to schedule a follow-up visit. Additionally your CT scan showed incidental findings of a left-sided kidney cyst, and a small liver cyst.  It also showed aortic atherosclerosis.  Please discuss these incidental findings with your primary care provider at your follow-up visit.  You may view your CT scan results in full on your MyChart account be sure to discuss them in their entirety with your primary care provider at your follow-up visit.  Get help right away if: Your pain does not go away as soon as your doctor says it should. You cannot stop vomiting. Your pain is only in areas of your belly, such as the right side or the left lower part of the belly. You have bloody or black poop, or poop that looks like tar. You have very bad pain, cramping, or bloating in your belly. You have signs of not having enough fluid or water in your body (dehydration), such as: Dark pee, very little pee, or no pee. Cracked lips. Dry mouth. Sunken eyes. Sleepiness. Weakness. You have trouble breathing or chest pain. You develop new bowel or bladder control problems. You have unusual weakness or  numbness in your arms or legs. You develop nausea or vomiting. You develop abdominal pain. You feel faint. You have any new/concerning or worsening of symptoms  Please read the additional information packets attached to your discharge summary.  Do not take your medicine if  develop an itchy rash, swelling in your mouth or lips, or difficulty breathing; call 911 and seek immediate emergency medical attention if this occurs.  Note: Portions of this text may have been transcribed using voice recognition software. Every effort was made to ensure accuracy; however, inadvertent computerized transcription errors may still be present.

## 2020-02-21 NOTE — ED Notes (Signed)
In and out cath verified with EDP, Apolinar Junes, PA. THe order was to encourage patient to urinate.  Discontinued as ordered.

## 2020-02-21 NOTE — ED Provider Notes (Addendum)
[facility] Provider Note   CSN: [id number] Arrival date & time: 02/21/20  1416     History Chief Complaint  Patient presents with  . Flank Pain    [facility] is a 64 y.o. female with history of hypertension, fibromyalgia, GERD, migraines, hysterectomy.  Patient presents today for right lower quadrant pain that has been intermittent since August 2020 after a fall.  She describes a throbbing pain intermittent occasionally radiating to her right flank no clear aggravating or alleviating factors, pain waxes and wanes between mild and moderate in intensity.  Of note patient reports that she has "irritable bowel syndrome" and feels her symptoms may be related to this today but is unsure if her fall may have caused this to worsen.  She denies fever/chills, headache, neck pain, chest pain/shortness of breath, upper abdominal pain, dysuria/hematuria, vaginal bleeding/discharge, concern for STI, numbness/weakness, tingling, bowel/bladder incontinence, urinary retention, new injury or falls since August 2020 or any additional concerns.  HPI     Past Medical History:  Diagnosis Date  . Fibromyalgia   . Hypertension     Patient Active Problem List   Diagnosis Date Noted  . Diarrhea 09/01/2019  . Abdominal pain 09/01/2019  . Hypertension   . GERD (gastroesophageal reflux disease)   . Depression   . Migraine   . Tobacco abuse   . Hypokalemia   . Acute respiratory disease due to COVID-19 virus     Past Surgical History:  Procedure Laterality Date  . CERVICAL FUSION       OB History   No obstetric history on file.     Family History  Problem Relation Age of Onset  . Stroke Mother   . Hypertension Mother   . Lung cancer Father        Father died of lung cancer    Social History   Tobacco Use  . Smoking status: Current Every Day Smoker  . Smokeless tobacco: Never Used  Substance Use Topics  . Alcohol use: Never  . Drug use: Never     Home Medications Prior to Admission medications   Medication Sig Start Date End Date Taking? Authorizing Provider  acetaminophen (TYLENOL) 325 MG tablet Take 2 tablets (650 mg total) by mouth every 6 (six) hours as needed for mild pain or headache (fever >/= 101). 09/05/19   Cherene Altes, MD  albuterol (VENTOLIN HFA) 108 (90 Base) MCG/ACT inhaler Inhale 2 puffs into the lungs 4 (four) times daily as needed for shortness of breath.    [provider]  amLODipine (NORVASC) 10 MG tablet Take 1 tablet (10 mg total) by mouth daily. 09/06/19   Cherene Altes, MD  dicyclomine (BENTYL) 20 MG tablet Take 1 tablet (20 mg total) by mouth 2 (two) times daily. 02/21/20   Nuala Alpha A, PA-C  escitalopram (LEXAPRO) 20 MG tablet Take 20 mg by mouth daily. 08/07/19   [provider]  gabapentin (NEURONTIN) 400 MG capsule Take 1 capsule (400 mg total) by mouth 3 (three) times daily. 09/05/19   Cherene Altes, MD  hydrOXYzine (ATARAX/VISTARIL) 50 MG tablet Take 100 mg by mouth 3 (three) times daily. 04/25/19   [provider]  levofloxacin (LEVAQUIN) 750 MG tablet Take 1 tablet (750 mg total) by mouth daily. 09/18/19   Little, Wenda Overland, MD  nicotine (NICODERM CQ - DOSED IN MG/24 HOURS) 21 mg/24hr patch Place 1 patch (21 mg total) onto the skin daily. 09/06/19   Joette Catching  T, MD  omeprazole (PRILOSEC) 40 MG capsule Take 40 mg by mouth daily. 06/11/19   [provider]  propranolol ER (INDERAL LA) 80 MG 24 hr capsule Take 80 mg by mouth daily. 06/11/19   [provider]  QUEtiapine (SEROQUEL) 25 MG tablet Take 1 tablet (25 mg total) by mouth at bedtime. 09/05/19   Lonia Blood, MD  SUMAtriptan (IMITREX) 50 MG tablet Take 50 mg by mouth as needed for migraine. Take a second tablet 2 hours later if needed; max 200mg /day 12/21/18   [provider]  triamcinolone cream (KENALOG) 0.1 % Apply 1 application topically See admin instructions. 2 to  3 times a day 08/06/19   [provider]    Allergies    Patient has no known allergies.  Review of Systems   Review of Systems Ten systems are reviewed and are negative for acute change except as noted in the HPI  Physical Exam Updated Vital Signs BP 137/73 (BP Location: Left Arm)   Pulse (!) 59   Temp 97.9 F (36.6 C) (Oral)   Resp 16   Ht 5\' 3"  (1.6 m)   Wt 72.1 kg   SpO2 100%   BMI 28.17 kg/m   Physical Exam Constitutional:      General: She is not in acute distress.    Appearance: Normal appearance. She is well-developed. She is not ill-appearing or diaphoretic.  HENT:     Head: Normocephalic and atraumatic.     Right Ear: External ear normal.     Left Ear: External ear normal.     Nose: Nose normal.  Eyes:     General: Vision grossly intact. Gaze aligned appropriately.     Pupils: Pupils are equal, round, and reactive to light.  Neck:     Trachea: Trachea and phonation normal. No tracheal deviation.  Cardiovascular:     Rate and Rhythm: Normal rate and regular rhythm.     Pulses: Normal pulses.     Heart sounds: Normal heart sounds.  Pulmonary:     Effort: Pulmonary effort is normal. No respiratory distress.  Abdominal:     General: There is no distension.     Palpations: Abdomen is soft.     Tenderness: There is abdominal tenderness in the right lower quadrant. There is right CVA tenderness. There is no left CVA tenderness, guarding or rebound.  Genitourinary:    Comments: Deferred by patient Musculoskeletal:        General: Normal range of motion.     Cervical back: Normal range of motion.     Comments: No midline C/T/L spinal tenderness to palpation, no deformity, crepitus, or step-off noted. No sign of injury to the neck or back.  Skin:    General: Skin is warm and dry.  Neurological:     Mental Status: She is alert.     GCS: GCS eye subscore is 4. GCS verbal subscore is 5. GCS motor subscore is 6.     Comments: Speech is clear and goal  oriented, follows commands Major Cranial nerves without deficit, no facial droop Normal strength in upper and lower extremities bilaterally including dorsiflexion and plantar flexion, strong and equal grip strength Sensation normal to light and sharp touch Moves extremities without ataxia, coordination intact  Psychiatric:        Behavior: Behavior normal.     ED Results / Procedures / Treatments   Labs (all labs ordered are listed, but only abnormal results are displayed) Labs Reviewed  CBC WITH DIFFERENTIAL/PLATELET - Abnormal; Notable for the following components:      Result Value   RBC 3.83 (*)    All other components within normal limits  COMPREHENSIVE METABOLIC PANEL - Abnormal; Notable for the following components:   AST 14 (*)    All other components within normal limits  URINALYSIS, ROUTINE W REFLEX MICROSCOPIC - Abnormal; Notable for the following components:   Specific Gravity, Urine <1.005 (*)    All other components within normal limits  LIPASE, BLOOD    EKG None  Radiology CT ABDOMEN PELVIS W CONTRAST  Result Date: 02/21/2020 CLINICAL DATA:  Right lower abdominal pain radiating to the right back for several months since having COVID-19 infection in August 2020. Abdominal distension and nausea. EXAM: CT ABDOMEN AND PELVIS WITH CONTRAST CT LUMBAR SPINE WITH CONTRAST TECHNIQUE: Multidetector CT imaging of the abdomen and pelvis was performed using the standard protocol following bolus administration of intravenous contrast. Imaging of the lumbar spine was reconstructed from the abdomen and pelvis CT dataset. CONTRAST:  OMNIPAQUE IOHEXOL 300 MG/ML  SOLN COMPARISON:  Lumbar spine radiographs, 09/19/2006. FINDINGS: Lower chest: Minimal dependent linear/reticular lung opacities consistent with atelectasis and/or scarring. Lung bases otherwise clear. Heart normal in size. Hepatobiliary: Liver normal in size and overall attenuation. 7 mm low-density mass at the dome of  segment 2 consistent with a cyst. No other liver masses or lesions. Normal gallbladder. No bile duct dilation. Pancreas: Unremarkable. No pancreatic ductal dilatation or surrounding inflammatory changes. Spleen: Normal in size without focal abnormality. Adrenals/Urinary Tract: No adrenal masses. Kidneys normal in size, orientation and position with symmetric enhancement and excretion. Two adjacent low-attenuation right renal masses, posterior mid to upper pole, largest measuring 7 mm, not fully characterized but likely cysts. Similar appearing cortical lesion, posterior, medial upper pole of the left kidney, 7-8 mm, also consistent with a cyst. No other renal masses or lesions, no stones and no hydronephrosis. Normal ureters. Normal bladder. Stomach/Bowel: Stomach is within normal limits. Appendix appears normal. No evidence of bowel wall thickening, distention, or inflammatory changes. Vascular/Lymphatic: Dense aortic atherosclerosis. No aneurysm no enlarged lymph nodes. Reproductive: Status post hysterectomy. No adnexal masses. Other: No abdominal wall hernia or abnormality. No abdominopelvic ascites. Musculoskeletal: No acute or significant osseous findings. IMPRESSION: 1. No acute findings within the abdomen or pelvis. No findings to account for the patient's symptoms. 2. Small liver and presumed renal cysts. 3. No bowel obstruction or inflammation.  Normal appendix. 4. Aortic atherosclerosis. Electronically Signed   By: Amie Portland M.D.   On: 02/21/2020 16:27   CT L-SPINE NO CHARGE  Result Date: 02/21/2020 CLINICAL DATA:  Right lower abdominal pain radiating to the right back for several months since having COVID-19 infection in August 2020. Abdominal distension and nausea. EXAM: CT ABDOMEN AND PELVIS WITH CONTRAST CT LUMBAR SPINE WITH CONTRAST TECHNIQUE: Multidetector CT imaging of the abdomen and pelvis was performed using the standard protocol following bolus administration of intravenous contrast.  Imaging of the lumbar spine was reconstructed from the abdomen and pelvis CT dataset. CONTRAST:  OMNIPAQUE IOHEXOL 300 MG/ML  SOLN COMPARISON:  Lumbar spine radiographs, 09/19/2006. FINDINGS: Lower chest: Minimal dependent linear/reticular lung opacities consistent with atelectasis and/or scarring. Lung bases otherwise clear. Heart normal in size. Hepatobiliary: Liver normal in size and overall attenuation. 7 mm low-density mass at the dome of segment 2 consistent with a cyst. No other liver masses or lesions. Normal gallbladder. No bile duct dilation. Pancreas: Unremarkable. No pancreatic ductal  dilatation or surrounding inflammatory changes. Spleen: Normal in size without focal abnormality. Adrenals/Urinary Tract: No adrenal masses. Kidneys normal in size, orientation and position with symmetric enhancement and excretion. Two adjacent low-attenuation right renal masses, posterior mid to upper pole, largest measuring 7 mm, not fully characterized but likely cysts. Similar appearing cortical lesion, posterior, medial upper pole of the left kidney, 7-8 mm, also consistent with a cyst. No other renal masses or lesions, no stones and no hydronephrosis. Normal ureters. Normal bladder. Stomach/Bowel: Stomach is within normal limits. Appendix appears normal. No evidence of bowel wall thickening, distention, or inflammatory changes. Vascular/Lymphatic: Dense aortic atherosclerosis. No aneurysm no enlarged lymph nodes. Reproductive: Status post hysterectomy. No adnexal masses. Other: No abdominal wall hernia or abnormality. No abdominopelvic ascites. Musculoskeletal: No acute or significant osseous findings. IMPRESSION: 1. No acute findings within the abdomen or pelvis. No findings to account for the patient's symptoms. 2. Small liver and presumed renal cysts. 3. No bowel obstruction or inflammation.  Normal appendix. 4. Aortic atherosclerosis. Electronically Signed   By: Amie Portland M.D.   On: 02/21/2020 16:27     Procedures Procedures (including critical care time)  Medications Ordered in ED Medications  sodium chloride 0.9 % bolus 500 mL (0 mLs Intravenous Stopped 02/21/20 1649)  iohexol (OMNIPAQUE) 300 MG/ML solution 100 mL (100 mLs Intravenous Contrast Given 02/21/20 1545)  sodium chloride 0.9 % bolus 500 mL (500 mLs Intravenous New Bag/Given 02/21/20 1652)    ED Course  I have reviewed the triage vital signs and the nursing notes.  Pertinent labs & imaging results that were available during my care of the patient were reviewed by me and considered in my medical decision making (see chart for details).    MDM Rules/Calculators/A&P                     64 year old female presents today for right flank and right lower quadrant pain that has been constant but waxing and waning for the past 6 months.  She reports pain began around the same time that she suffered a fall after she tripped.  She has not had any new injury since that time.  She denies any fever/chills, nausea/vomiting, diarrhea, urinary symptoms or vaginal complaints.  She is well-appearing in no acute distress on exam, cranial nerves intact, no meningeal signs, heart regular rhythm, lungs clear, abdomen soft, tender in the right lower quadrant and right flank without peritoneal signs, neurovascular tact to all 4 extremities no evidence of DVT.  She has no midline spinal tenderness, she does have some right lumbar paraspinal muscular tenderness without overlying skin change.  She has no red flag symptoms to suggest cauda equina.  Will obtain abdominal pain lab work and CT abdomen pelvis in addition to lumbar spine noncontrast.  Chart review shows patient was admitted to the hospital in September 2020 for COVID-19 viral infection.  Patient reports that she has fully recovered from this. --- CBC reassuring no evidence of anemia or leukocytosis to suggest infection Lipase within normal limits no evidence of a pancreatitis CMP reassuring no  elevation of LFTs, no acute electrolyte abnormalities or evidence of kidney injury Urinalysis without evidence of infection  CT abdomen pelvis:  IMPRESSION:  1. No acute findings within the abdomen or pelvis. No findings to  account for the patient's symptoms.  2. Small liver and presumed renal cysts.  3. No bowel obstruction or inflammation. Normal appendix.  4. Aortic atherosclerosis.   CT Lumbar Spine:  IMPRESSION:  1. No acute findings within the abdomen or pelvis. No findings to  account for the patient's symptoms.  2. Small liver and presumed renal cysts.  3. No bowel obstruction or inflammation. Normal appendix.  4. Aortic atherosclerosis.  - Patient reassessed resting comfortably no acute distress reports that she is feeling better and would like to go home.  She has been updated on findings as above and states understanding.  She is requesting referral to orthopedist for concern of "arthritis".  Feel it is reasonable to refer to orthopedist at this time.  Again she denies any red flag symptoms to suggest a cauda equina syndrome there is no indication for MRI at this time.  Suspect patient's pain for the past 6 months may be secondary to arthritis of the hip, there is no evidence for infection to resist antibiotics at this time.  Additionally her chronic abdominal pain may be related to which she describes as her "irritable bowel syndrome".  We will give her Bentyl to help with symptoms.  Additionally she denied any pelvic symptoms or abnormal discharge she refused a pelvic exam today which I feel is reasonable I encouraged her to follow-up with her OB/GYN for reassessment.  Patient encouraged to follow-up with her PCP regarding incidental findings on CT scan  At this time there does not appear to be any evidence of an acute emergency medical condition and the patient appears stable for discharge with appropriate outpatient follow up. Diagnosis was discussed with patient who  verbalizes understanding of care plan and is agreeable to discharge. I have discussed return precautions with patient who verbalizes understanding of return precautions. Patient encouraged to follow-up with their PCP and Ortho. All questions answered.  Patient's case discussed with Dr. Criss Alvine who agrees with plan to discharge with follow-up.   Note: Portions of this report may have been transcribed using voice recognition software. Every effort was made to ensure accuracy; however, inadvertent computerized transcription errors may still be present. Final Clinical Impression(s) / ED Diagnoses Final diagnoses:  Fall  Right lateral abdominal pain    Rx / DC Orders ED Discharge Orders         Ordered    dicyclomine (BENTYL) 20 MG tablet  2 times daily     02/21/20 1732           Elizabeth Palau 02/21/20 1740    Pricilla Loveless, MD 02/22/20 [phone]

## 2020-02-21 NOTE — ED Triage Notes (Signed)
R flank pain since August, radiates into her back. States she has been seen previously without acute diagnosis.

## 2020-08-24 IMAGING — DX DG ANKLE PORT 2V*R*
3 series · 3 of 3 positions shown · non-contrast
Comparison: None.

CLINICAL DATA: Right ankle pain after fall 2 days ago.

EXAM:
PORTABLE RIGHT ANKLE - 2 VIEW

[ankle ap]
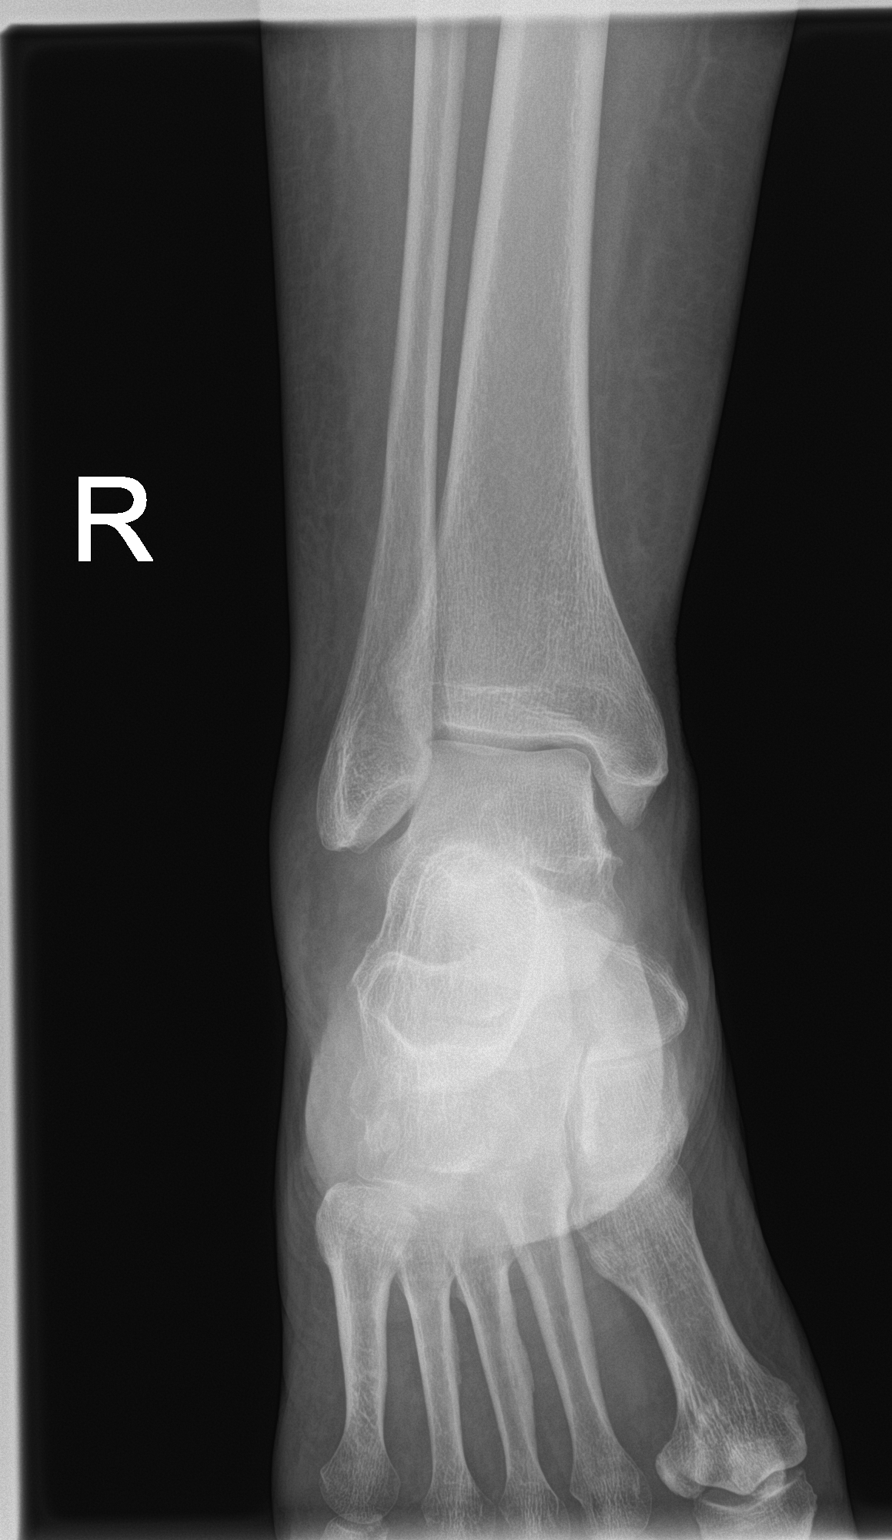

[ankle lat]
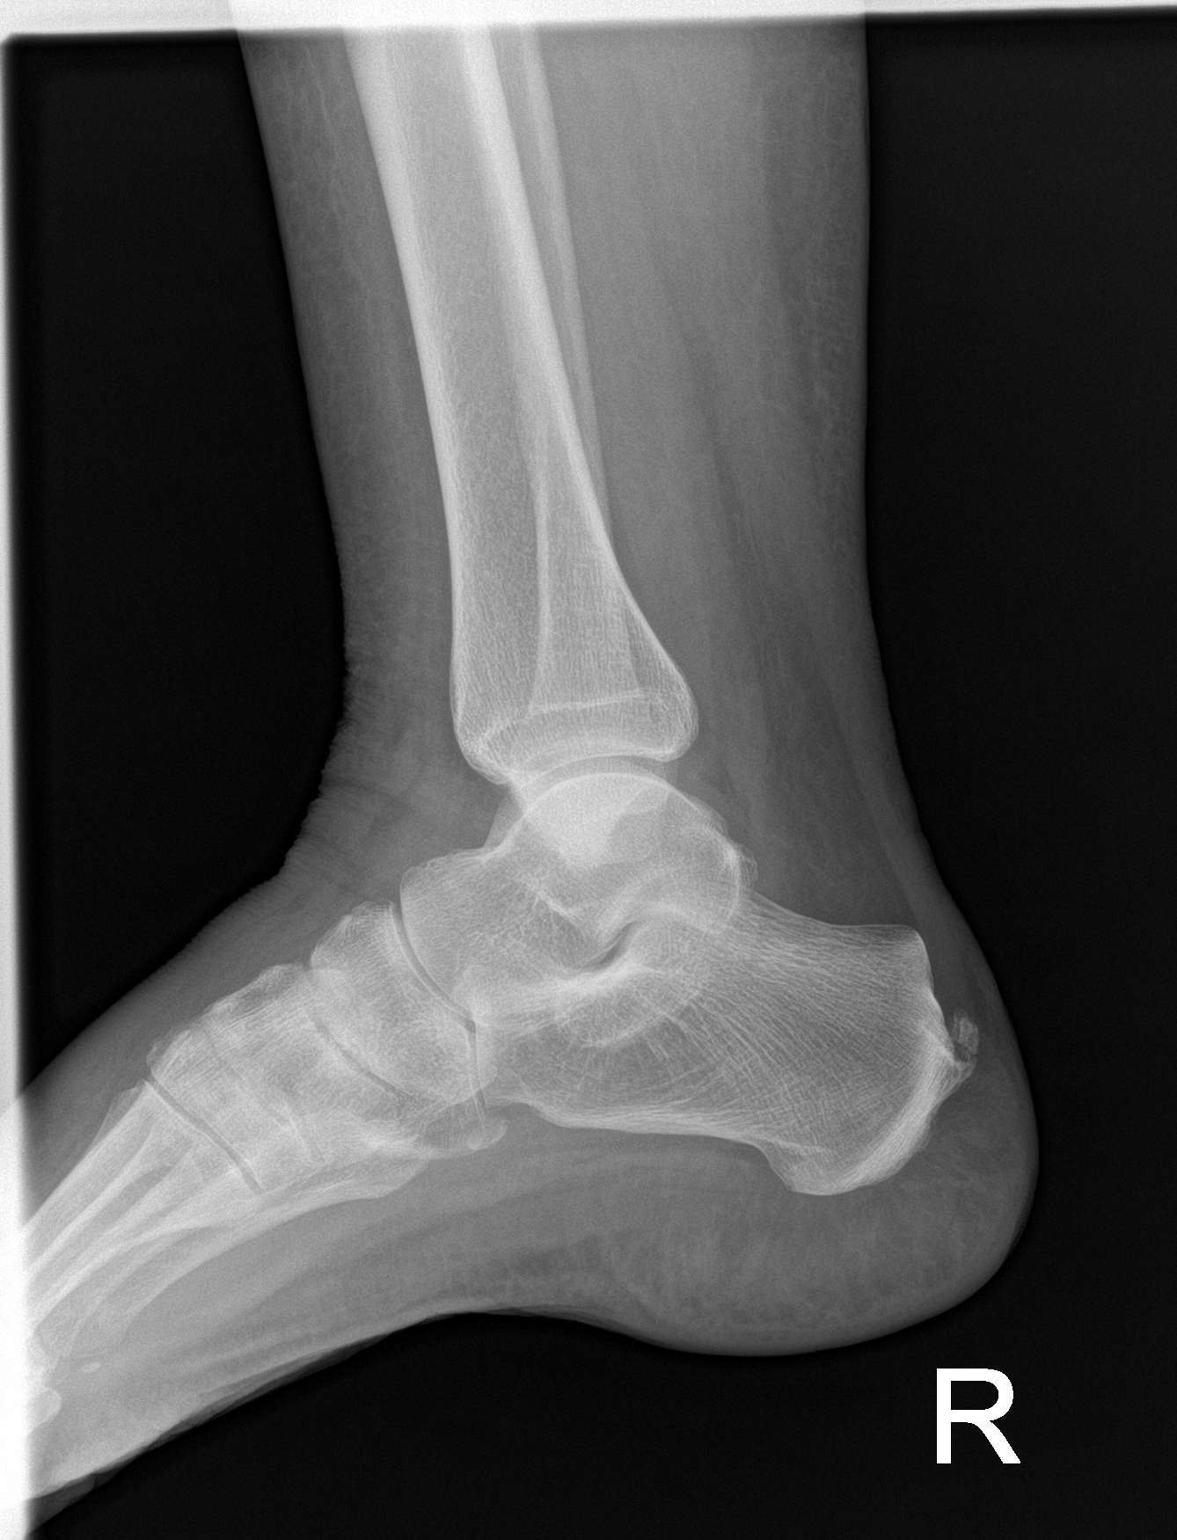

[ankle obl]
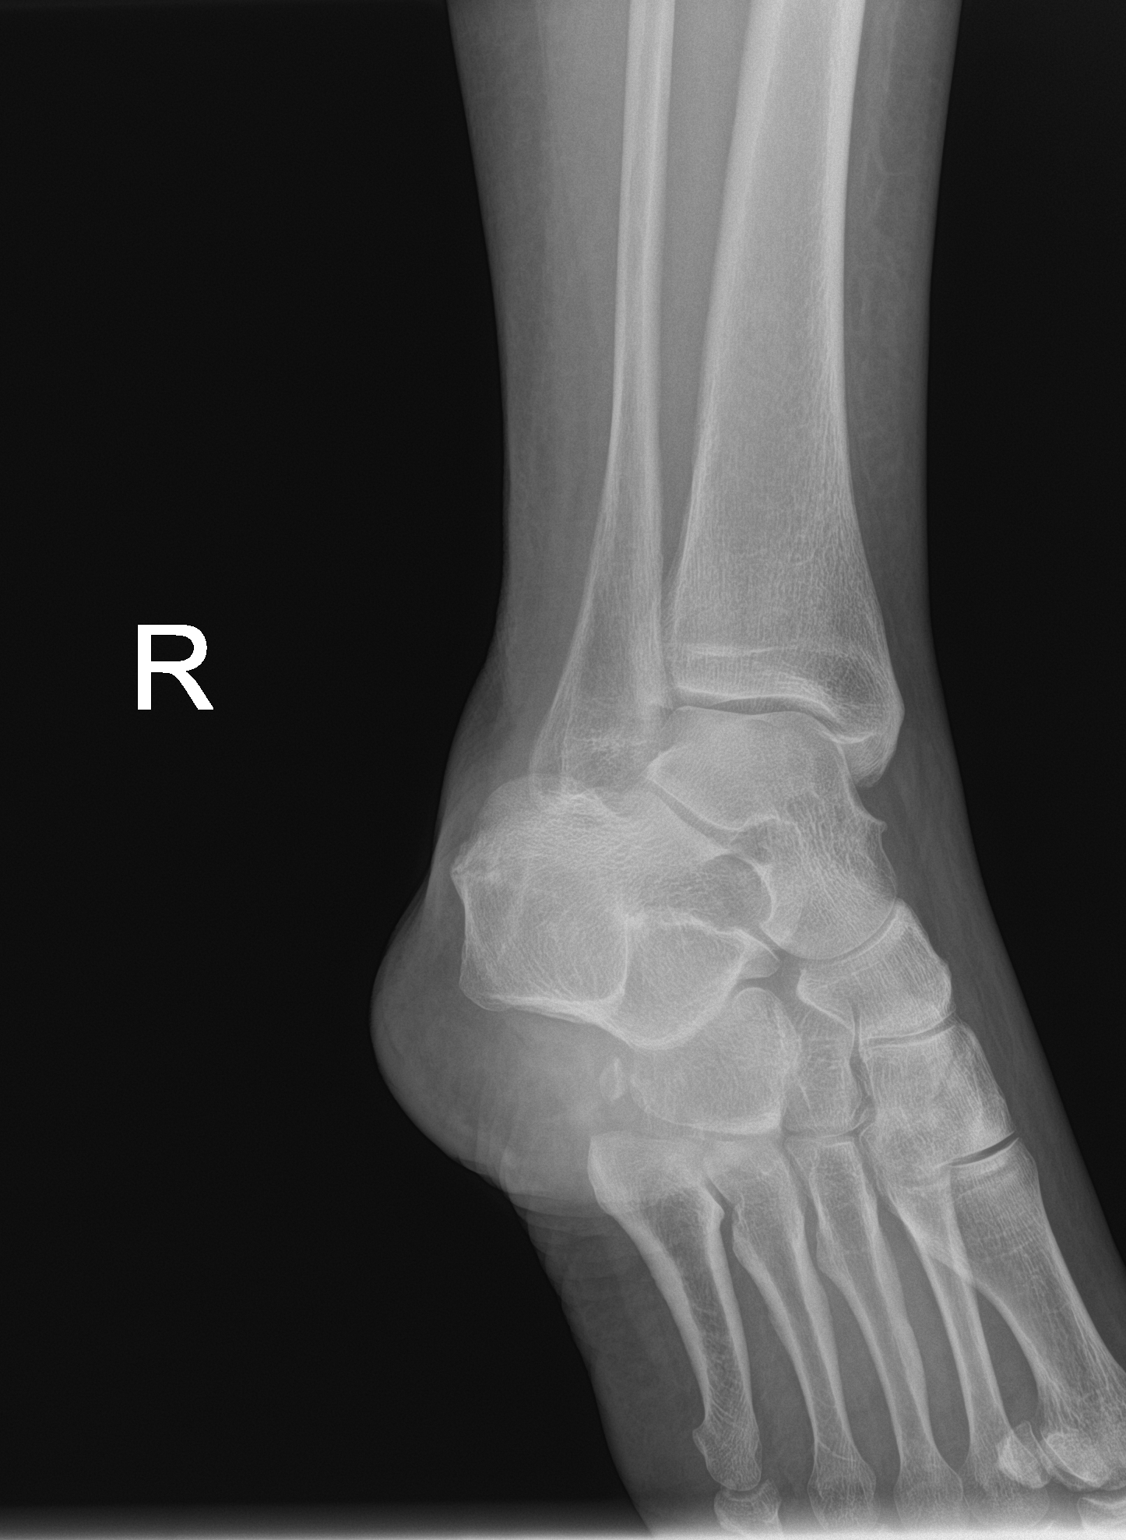

[3 of 3 positions shown; findings below may reference images not displayed]

FINDINGS: There is no evidence of fracture, dislocation, or joint effusion.
There is no evidence of arthropathy or other focal bone abnormality.
Soft tissues are unremarkable.
IMPRESSION: Negative.

## 2020-08-24 IMAGING — DX DG CHEST 1V PORT
1 series · 1 of 1 positions shown · non-contrast
Comparison: Radiograph September 03, 2019.

CLINICAL DATA: Hypoxia.

EXAM:
PORTABLE CHEST 1 VIEW

[chest ap]
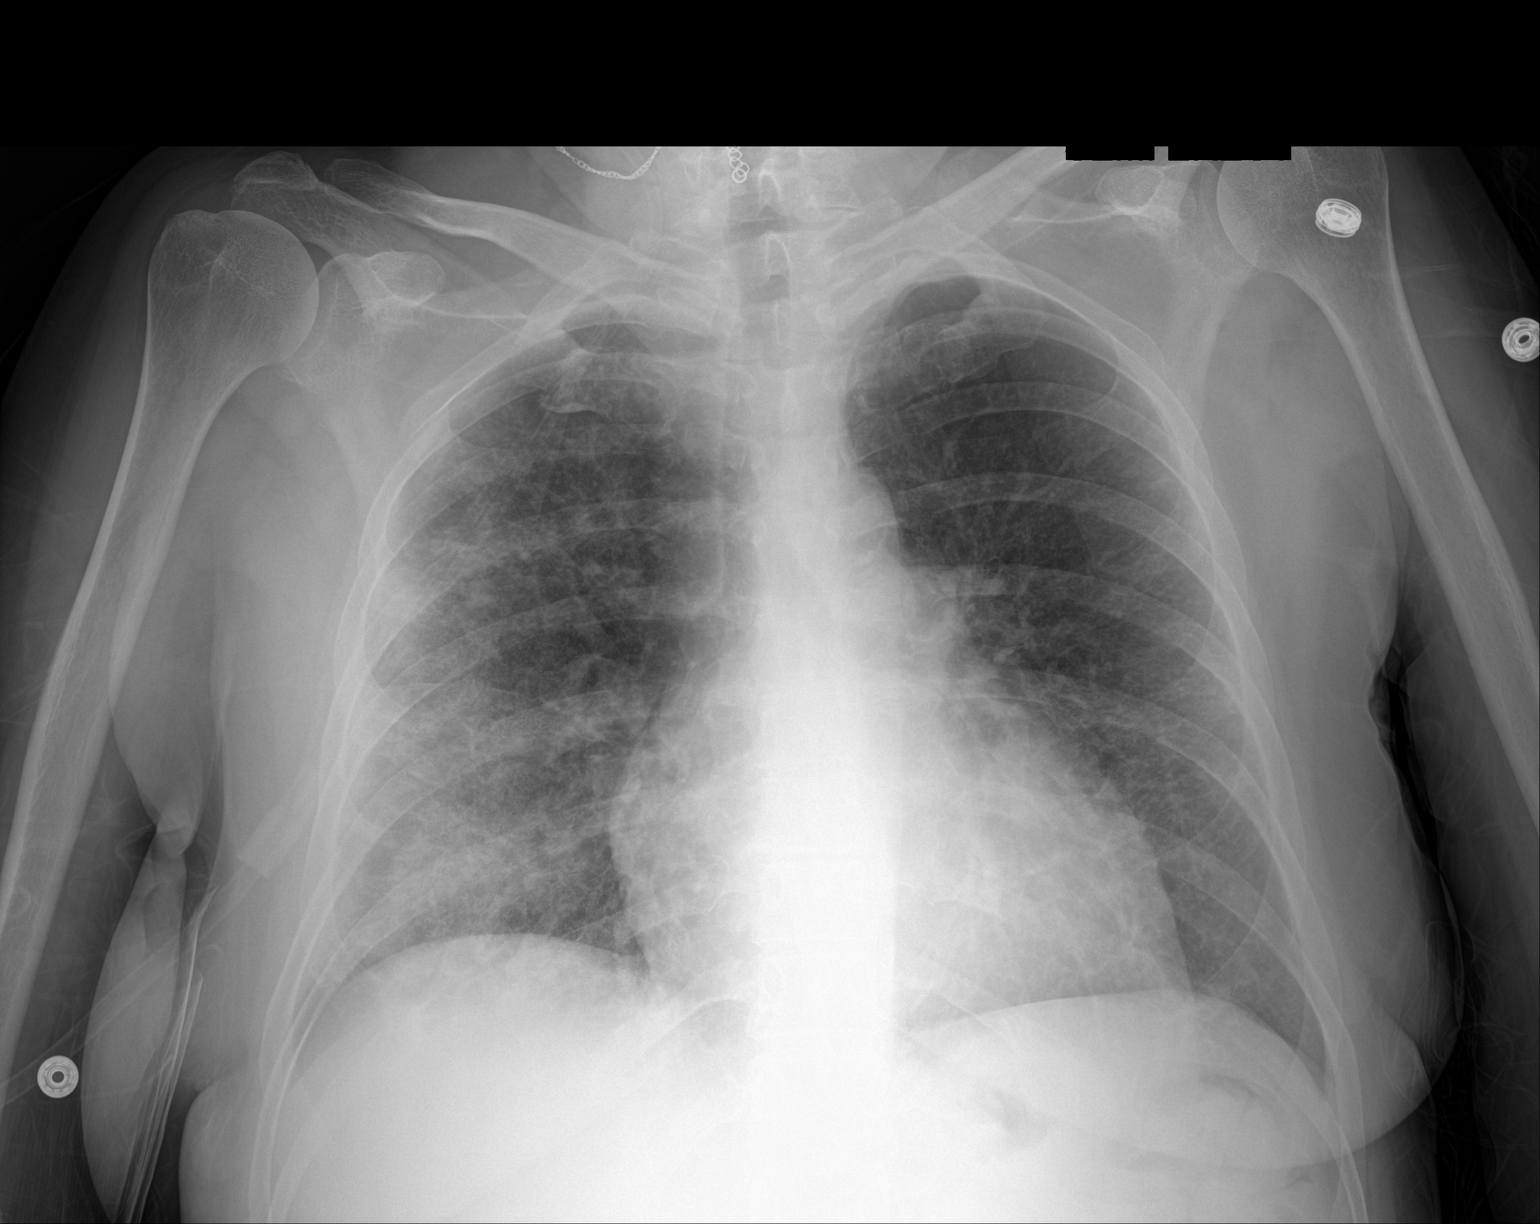

[1 of 1 positions shown; findings below may reference images not displayed]

FINDINGS: Stable cardiomediastinal silhouette. No pneumothorax or pleural
effusion is noted. Left lung is clear. New large peripheral airspace
opacity is noted in the right lung concerning for pneumonia. Bony
thorax is unremarkable.
IMPRESSION: New large peripheral right lung airspace opacity is noted concerning
for pneumonia. Followup PA and lateral chest X-ray is recommended in
3-4 weeks following trial of antibiotic therapy to ensure resolution
and exclude underlying malignancy.

## 2021-01-27 IMAGING — CT CT L SPINE W/O CM
4 of 8 series · 14 of 33 positions shown, 15 images · IV contrast (Omnipaque)
Comparison: Lumbar spine radiographs, 09/19/2006.

CLINICAL DATA: Right lower abdominal pain radiating to the right
back for several months since having KFCUI-LD infection in July 2019. Abdominal distension and nausea.

EXAM:
CT ABDOMEN AND PELVIS WITH CONTRAST
CT LUMBAR SPINE WITH CONTRAST
TECHNIQUE: Multidetector CT imaging of the abdomen and pelvis was performed
using the standard protocol following bolus administration of
intravenous contrast.
Imaging of the lumbar spine was reconstructed from the abdomen and
pelvis CT dataset.
CONTRAST:  100mL OMNIPAQUE IOHEXOL 300 MG/ML  SOLN

[Series 2: axial st · axial · 0.97mm/px · z∈[-418,-132]mm · 4 of 96 slices shown, 5 images]
[im 20/96  soft-tissue]
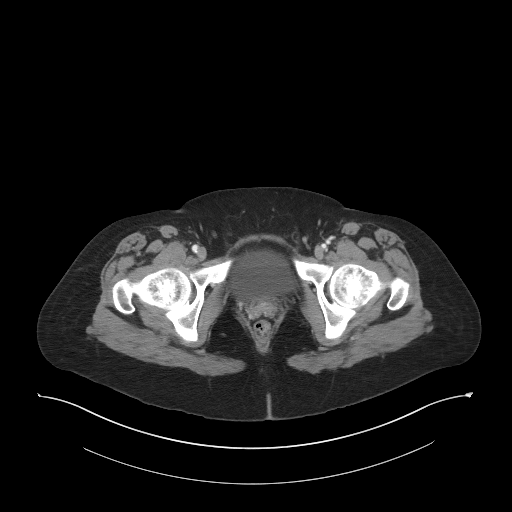
[im 20/96  bone]
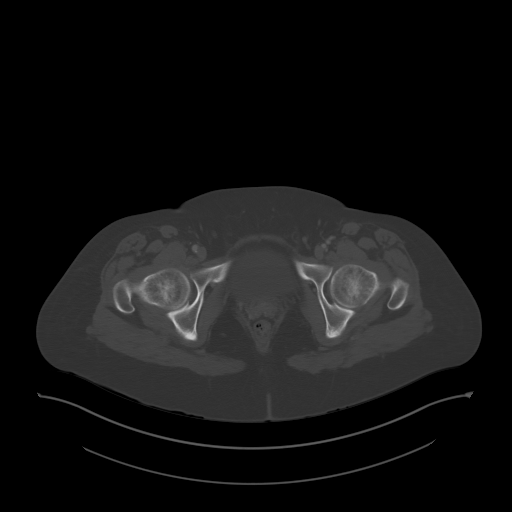
[im 39/96  bone]
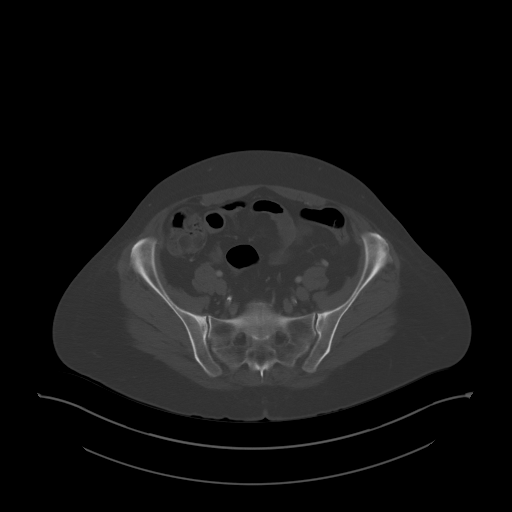
[im 58/96  bone]
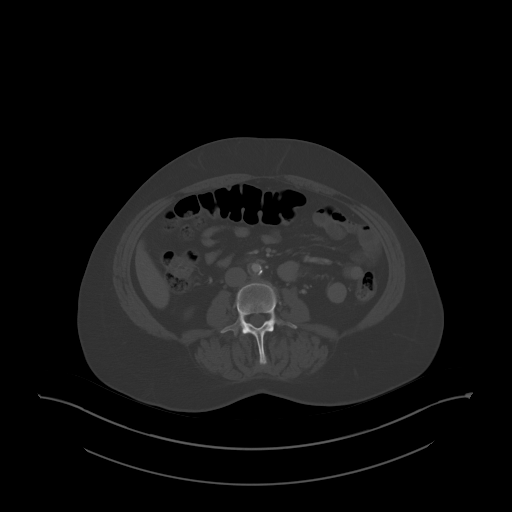
[im 77/96  bone]
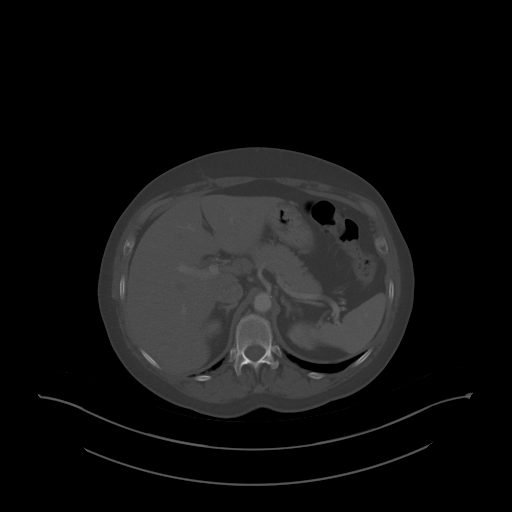

[Series 5: coronal st · coronal · 0.75mm/px · 1 of 95 slices shown]
[im 48/95  bone]
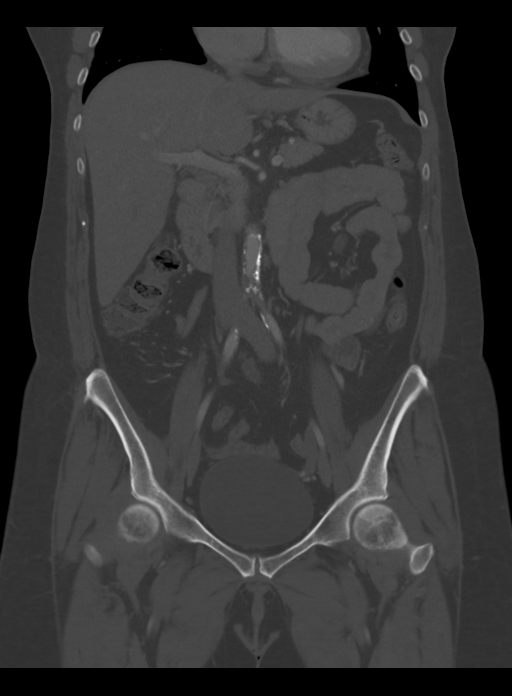

[Series 6: sagittal st · sagittal · 0.62mm/px · 5 of 127 slices shown]
[im 32/127  bone]
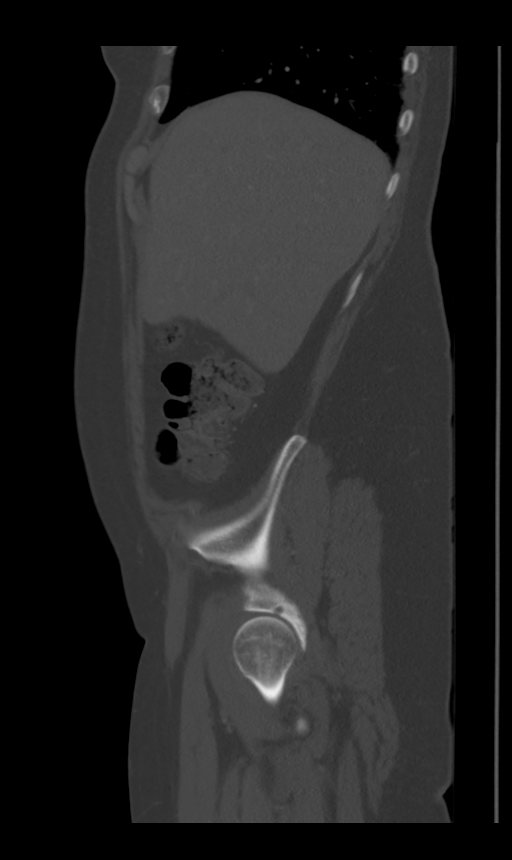
[im 48/127  bone]
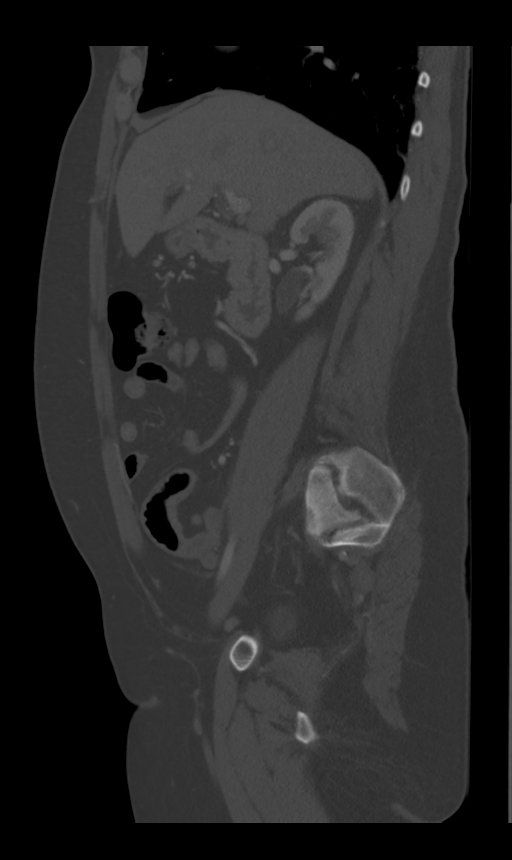
[im 64/127  bone]
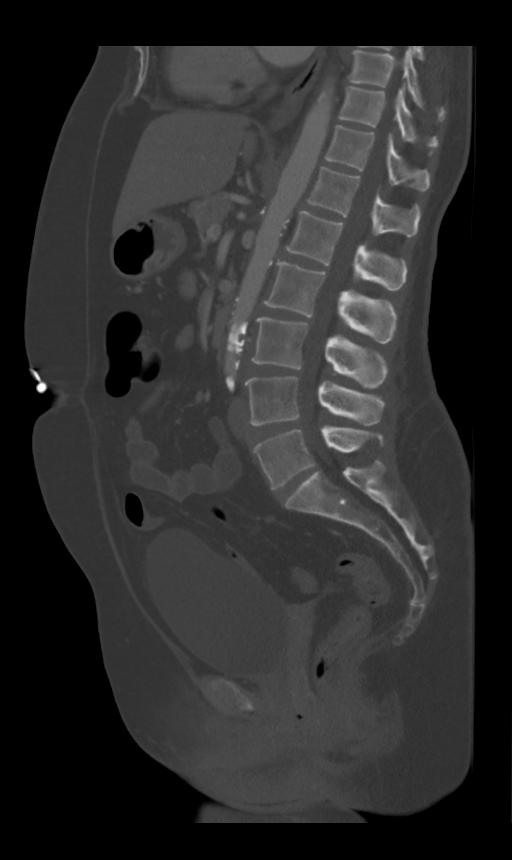
[im 79/127  bone]
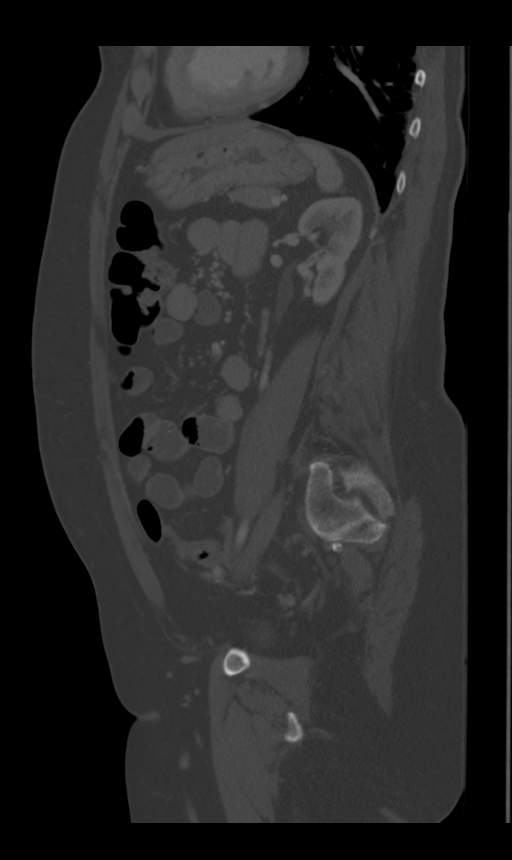
[im 95/127  bone]
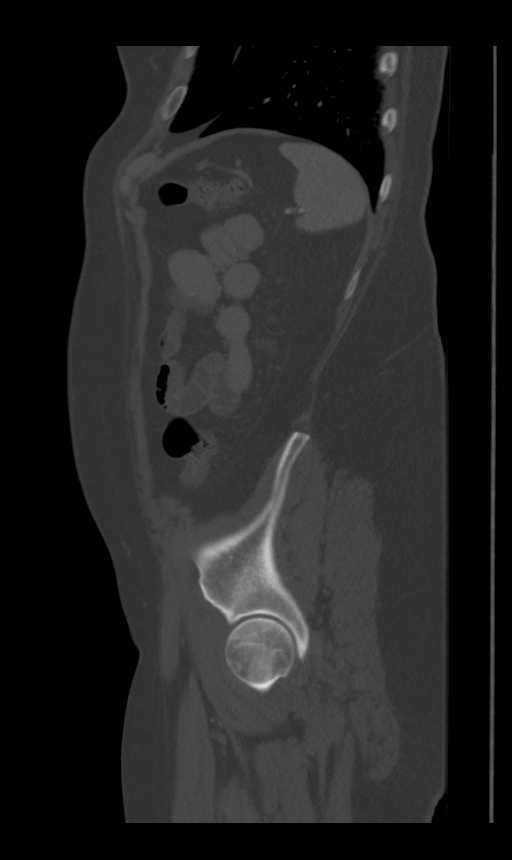

[Series 8: axial bone l/s · axial · 0.28mm/px · z∈[-266,-152]mm · 4 of 96 slices shown]
[im 20/96  bone]
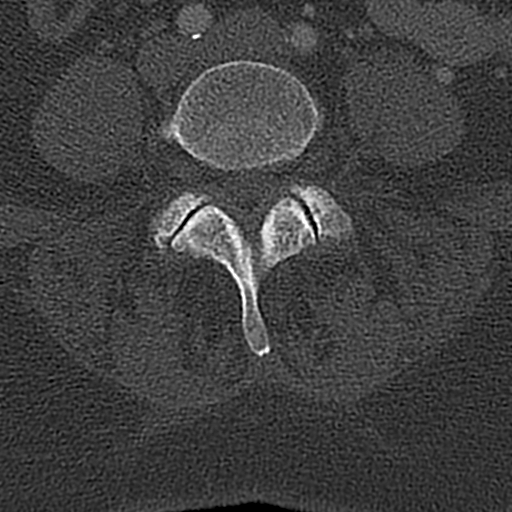
[im 39/96  bone]
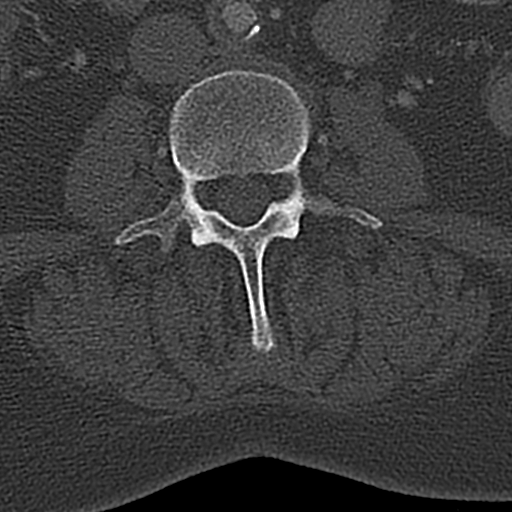
[im 58/96  bone]
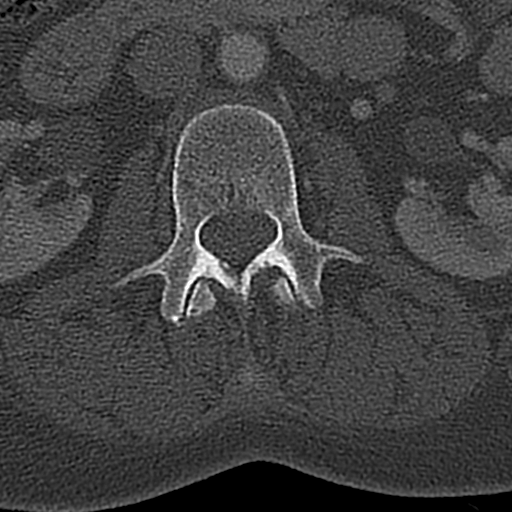
[im 77/96  bone]
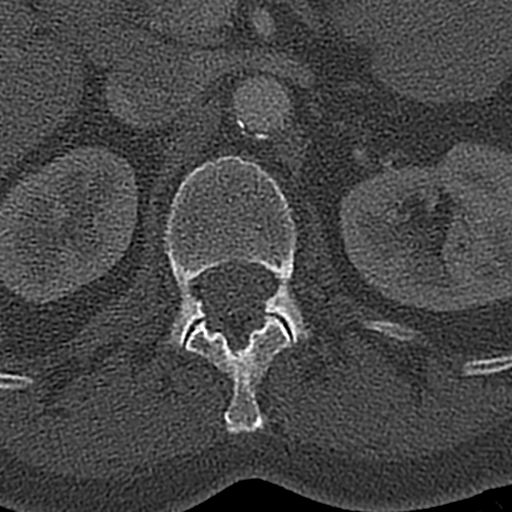

[14 of 33 positions shown; findings below may reference images not displayed]

FINDINGS: Lower chest: Minimal dependent linear/reticular lung opacities
consistent with atelectasis and/or scarring. Lung bases otherwise
clear. Heart normal in size.

Hepatobiliary: Liver normal in size and overall attenuation. 7 mm
low-density mass at the dome of segment 2 consistent with a cyst. No
other liver masses or lesions. Normal gallbladder. No bile duct
dilation.

Pancreas: Unremarkable. No pancreatic ductal dilatation or
surrounding inflammatory changes.

Spleen: Normal in size without focal abnormality.

Adrenals/Urinary Tract: No adrenal masses.

Kidneys normal in size, orientation and position with symmetric
enhancement and excretion. Two adjacent low-attenuation right renal
masses, posterior mid to upper pole, largest measuring 7 mm, not
fully characterized but likely cysts. Similar appearing cortical
lesion, posterior, medial upper pole of the left kidney, 7-8 mm,
also consistent with a cyst. No other renal masses or lesions, no
stones and no hydronephrosis. Normal ureters. Normal bladder.

Stomach/Bowel: Stomach is within normal limits. Appendix appears
normal. No evidence of bowel wall thickening, distention, or
inflammatory changes.

Vascular/Lymphatic: Dense aortic atherosclerosis. No aneurysm no
enlarged lymph nodes.

Reproductive: Status post hysterectomy. No adnexal masses.

Other: No abdominal wall hernia or abnormality. No abdominopelvic
ascites.

Musculoskeletal: No acute or significant osseous findings.
IMPRESSION: 1. No acute findings within the abdomen or pelvis. No findings to
account for the patient's symptoms.
2. Small liver and presumed renal cysts.
3. No bowel obstruction or inflammation.  Normal appendix.
4. Aortic atherosclerosis.

## 2021-11-30 ENCOUNTER — Ambulatory Visit (INDEPENDENT_AMBULATORY_CARE_PROVIDER_SITE_OTHER): Payer: Medicare Other

## 2021-11-30 ENCOUNTER — Other Ambulatory Visit: Payer: Self-pay

## 2021-11-30 ENCOUNTER — Encounter: Payer: Self-pay | Admitting: Emergency Medicine

## 2021-11-30 ENCOUNTER — Ambulatory Visit
Admission: EM | Admit: 2021-11-30 | Discharge: 2021-11-30 | Disposition: A | Discharge status: Home / Self Care | Payer: Medicare Other

## 2021-11-30 DIAGNOSIS — R059 Cough, unspecified: Secondary | ICD-10-CM

## 2021-11-30 MED ORDER — PREDNISONE 50 MG PO TABS: ORAL_TABLET | ORAL | 0 refills | Status: DC

## 2021-11-30 MED ORDER — ALBUTEROL SULFATE HFA 108 (90 BASE) MCG/ACT IN AERS
2.0000 | INHALATION_SPRAY | Freq: Once | RESPIRATORY_TRACT | Status: AC
  Administered 2021-11-30: 2 via RESPIRATORY_TRACT

## 2021-11-30 NOTE — ED Triage Notes (Signed)
Pt reports cough, headache, diarrhea for last several days. Pt denies any known fevers.

## 2021-12-01 LAB — COVID-19, FLU A+B NAA
Influenza A, NAA: NOT DETECTED
Influenza B, NAA: NOT DETECTED
SARS-CoV-2, NAA: NOT DETECTED

## 2021-12-01 NOTE — ED Provider Notes (Signed)
[facility]    CSN: [id number] Arrival date & time: 11/30/21  1133      History   Chief Complaint Chief Complaint  Patient presents with   Cough    HPI Emma Gould is a 65 y.o. female.   The history is provided by the patient. No language interpreter was used.  Cough Cough characteristics:  Non-productive Severity:  Moderate Onset quality:  Gradual Duration:  5 days Timing:  Constant Relieved by:  Nothing Worsened by:  Nothing Ineffective treatments:  None tried Associated symptoms: shortness of breath    Past Medical History:  Diagnosis Date   Fibromyalgia    Hypertension     Patient Active Problem List   Diagnosis Date Noted   Diarrhea 09/01/2019   Abdominal pain 09/01/2019   Hypertension    GERD (gastroesophageal reflux disease)    Depression    Migraine    Tobacco abuse    Hypokalemia    Acute respiratory disease due to COVID-19 virus     Past Surgical History:  Procedure Laterality Date   CERVICAL FUSION      OB History   No obstetric history on file.      Home Medications    Prior to Admission medications   Medication Sig Start Date End Date Taking? Authorizing Provider  carvedilol (COREG) 25 MG tablet Take 25 mg by mouth 2 (two) times daily with a meal.   Yes [provider]  hydrochlorothiazide (HYDRODIURIL) 25 MG tablet Take 25 mg by mouth daily.   Yes [provider]  predniSONE (DELTASONE) 50 MG tablet One tablet a day 11/30/21  Yes Fransico Meadow, PA-C  rosuvastatin (CRESTOR) 20 MG tablet Take 20 mg by mouth daily.   Yes [provider]  valsartan (DIOVAN) 320 MG tablet Take 320 mg by mouth daily.   Yes [provider]  venlafaxine XR (EFFEXOR-XR) 75 MG 24 hr capsule Take 75 mg by mouth daily with breakfast.   Yes [provider]  acetaminophen (TYLENOL) 325 MG tablet Take 2 tablets (650 mg total) by mouth every 6 (six) hours as needed for mild pain or headache (fever >/=  101). 09/05/19   Cherene Altes, MD  albuterol (VENTOLIN HFA) 108 (90 Base) MCG/ACT inhaler Inhale 2 puffs into the lungs 4 (four) times daily as needed for shortness of breath.    [provider]  amLODipine (NORVASC) 10 MG tablet Take 1 tablet (10 mg total) by mouth daily. 09/06/19   Cherene Altes, MD  dicyclomine (BENTYL) 20 MG tablet Take 1 tablet (20 mg total) by mouth 2 (two) times daily. 02/21/20   Nuala Alpha A, PA-C  escitalopram (LEXAPRO) 20 MG tablet Take 20 mg by mouth daily. 08/07/19   [provider]  gabapentin (NEURONTIN) 400 MG capsule Take 1 capsule (400 mg total) by mouth 3 (three) times daily. 09/05/19   Cherene Altes, MD  hydrOXYzine (ATARAX/VISTARIL) 50 MG tablet Take 100 mg by mouth 3 (three) times daily. Patient not taking: Reported on 11/30/2021 04/25/19   [provider]  levofloxacin (LEVAQUIN) 750 MG tablet Take 1 tablet (750 mg total) by mouth daily. Patient not taking: Reported on 11/30/2021 09/18/19   Little, Wenda Overland, MD  nicotine (NICODERM CQ - DOSED IN MG/24 HOURS) 21 mg/24hr patch Place 1 patch (21 mg total) onto the skin daily. Patient not taking: Reported on 11/30/2021 09/06/19   Cherene Altes, MD  omeprazole (PRILOSEC) 40 MG capsule Take 40 mg  by mouth daily. 06/11/19   [provider]  propranolol ER (INDERAL LA) 80 MG 24 hr capsule Take 80 mg by mouth daily. 06/11/19   [provider]  QUEtiapine (SEROQUEL) 25 MG tablet Take 1 tablet (25 mg total) by mouth at bedtime. Patient taking differently: Take 100 mg by mouth at bedtime. 09/05/19   Cherene Altes, MD  SUMAtriptan (IMITREX) 50 MG tablet Take 50 mg by mouth as needed for migraine. Take a second tablet 2 hours later if needed; max 200mg /day 12/21/18   [provider]  triamcinolone cream (KENALOG) 0.1 % Apply 1 application topically See admin instructions. 2 to 3 times a day 08/06/19   [provider]    Family History Family  History  Problem Relation Age of Onset   Stroke Mother    Hypertension Mother    Lung cancer Father        Father died of lung cancer    Social History Social History   Tobacco Use   Smoking status: Every Day   Smokeless tobacco: Never  Substance Use Topics   Alcohol use: Never   Drug use: Never     Allergies   Patient has no known allergies.   Review of Systems Review of Systems  Respiratory:  Positive for cough and shortness of breath.   All other systems reviewed and are negative.   Physical Exam Triage Vital Signs ED Triage Vitals  Enc Vitals Group     BP 11/30/21 1156 (!) 159/78     Pulse Rate 11/30/21 1156 61     Resp 11/30/21 1156 18     Temp 11/30/21 1156 98.8 F (37.1 C)     Temp Source 11/30/21 1156 Oral     SpO2 11/30/21 1156 96 %     Weight 11/30/21 1157 160 lb (72.6 kg)     Height 11/30/21 1157 5\' 3"  (1.6 m)     Head Circumference --      Peak Flow --      Pain Score 11/30/21 1157 8     Pain Loc --      Pain Edu? --      Excl. in [place]? --    No data found.  Updated Vital Signs BP (!) 159/78 (BP Location: Right Arm)   Pulse 61   Temp 98.8 F (37.1 C) (Oral)   Resp 18   Ht 5\' 3"  (1.6 m)   Wt 72.6 kg   SpO2 96%   BMI 28.34 kg/m   Visual Acuity Right Eye Distance:   Left Eye Distance:   Bilateral Distance:    Right Eye Near:   Left Eye Near:    Bilateral Near:     Physical Exam Vitals and nursing note reviewed.  Constitutional:      Appearance: She is well-developed.  HENT:     Head: Normocephalic.     Nose: Nose normal.     Mouth/Throat:     Mouth: Mucous membranes are moist.  Cardiovascular:     Rate and Rhythm: Normal rate.  Pulmonary:     Effort: Pulmonary effort is normal.  Abdominal:     General: There is no distension.  Musculoskeletal:        General: Normal range of motion.     Cervical back: Normal range of motion.  Skin:    General: Skin is warm.  Neurological:     Mental Status: She is alert and oriented  to person, place, and time.  UC Treatments / Results  Labs (all labs ordered are listed, but only abnormal results are displayed) Labs Reviewed  COVID-19, FLU A+B NAA    EKG   Radiology DG Chest 2 View  Result Date: 11/30/2021 CLINICAL DATA:  Cough, headache and diarrhea. EXAM: CHEST - 2 VIEW COMPARISON:  September 18, 2019 FINDINGS: Mild, chronic appearing increased lung markings are seen. There is no evidence of acute infiltrate, pleural effusion or pneumothorax. The heart size and mediastinal contours are within normal limits. Mild to moderate severity calcification of the aortic arch is noted. The visualized skeletal structures are unremarkable. IMPRESSION: No active cardiopulmonary disease. Electronically Signed   By: Aram Candela M.D.   On: 11/30/2021 15:10    Procedures Procedures (including critical care time)  Medications Ordered in UC Medications  albuterol (VENTOLIN HFA) 108 (90 Base) MCG/ACT inhaler 2 puff (2 puffs Inhalation Given 11/30/21 1508)    Initial Impression / Assessment and Plan / UC Course  I have reviewed the triage vital signs and the nursing notes.  Pertinent labs & imaging results that were available during my care of the patient were reviewed by me and considered in my medical decision making (see chart for details).    MDM:  Pt given albuterol and predniosone Final Clinical Impressions(s) / UC Diagnoses   Final diagnoses:  Cough, unspecified type   Discharge Instructions   None    ED Prescriptions     Medication Sig Dispense Auth. Provider   predniSONE (DELTASONE) 50 MG tablet One tablet a day 5 tablet Elson Areas, [place]      PDMP not reviewed this encounter. An After Visit Summary was printed and given to the patient.    Elson Areas, [place] 12/01/21 [phone]

## 2022-03-11 DIAGNOSIS — I25119 Atherosclerotic heart disease of native coronary artery with unspecified angina pectoris: Secondary | ICD-10-CM | POA: Diagnosis present

## 2022-03-11 DIAGNOSIS — I2510 Atherosclerotic heart disease of native coronary artery without angina pectoris: Secondary | ICD-10-CM | POA: Diagnosis present

## 2022-10-15 ENCOUNTER — Emergency Department
Admission: EM | Admit: 2022-10-15 | Discharge: 2022-10-15 | Discharge status: Against Medical Advice | Payer: Medicare Other | Attending: Emergency Medicine | Admitting: Emergency Medicine

## 2022-10-15 ENCOUNTER — Emergency Department: Payer: Medicare Other

## 2022-10-15 ENCOUNTER — Other Ambulatory Visit: Payer: Self-pay

## 2022-10-15 DIAGNOSIS — E876 Hypokalemia: Secondary | ICD-10-CM | POA: Insufficient documentation

## 2022-10-15 DIAGNOSIS — B379 Candidiasis, unspecified: Secondary | ICD-10-CM | POA: Diagnosis not present

## 2022-10-15 DIAGNOSIS — D72829 Elevated white blood cell count, unspecified: Secondary | ICD-10-CM | POA: Diagnosis not present

## 2022-10-15 DIAGNOSIS — Z8616 Personal history of COVID-19: Secondary | ICD-10-CM | POA: Insufficient documentation

## 2022-10-15 DIAGNOSIS — R1310 Dysphagia, unspecified: Secondary | ICD-10-CM | POA: Diagnosis present

## 2022-10-15 DIAGNOSIS — K209 Esophagitis, unspecified without bleeding: Secondary | ICD-10-CM | POA: Diagnosis not present

## 2022-10-15 LAB — COMPREHENSIVE METABOLIC PANEL
ALT: 21 U/L (ref 0–44)
AST: 16 U/L (ref 15–41)
Albumin: 3.7 g/dL (ref 3.5–5.0)
Alkaline Phosphatase: 37 U/L — ABNORMAL LOW (ref 38–126)
Anion gap: 11 (ref 5–15)
BUN: 43 mg/dL — ABNORMAL HIGH (ref 8–23)
CO2: 19 mmol/L — ABNORMAL LOW (ref 22–32)
Calcium: 9.1 mg/dL (ref 8.9–10.3)
Chloride: 105 mmol/L (ref 98–111)
Creatinine, Ser: 2.13 mg/dL — ABNORMAL HIGH (ref 0.44–1.00)
GFR, Estimated: 25 mL/min — ABNORMAL LOW (ref >60)
Glucose, Bld: 104 mg/dL — ABNORMAL HIGH (ref 70–99)
Potassium: 3.2 mmol/L — ABNORMAL LOW (ref 3.5–5.1)
Sodium: 135 mmol/L (ref 135–145)
Total Bilirubin: 0.4 mg/dL (ref 0.3–1.2)
Total Protein: 6.5 g/dL (ref 6.5–8.1)

## 2022-10-15 LAB — CBC WITH DIFFERENTIAL/PLATELET
Abs Immature Granulocytes: 0.85 10*3/uL — ABNORMAL HIGH (ref 0.00–0.07)
Basophils Absolute: 0.1 10*3/uL (ref 0.0–0.1)
Basophils Relative: 1 %
Eosinophils Absolute: 0 10*3/uL (ref 0.0–0.5)
Eosinophils Relative: 0 %
HCT: 37.4 % (ref 36.0–46.0)
Hemoglobin: 12.9 g/dL (ref 12.0–15.0)
Immature Granulocytes: 6 %
Lymphocytes Relative: 11 %
Lymphs Abs: 1.5 10*3/uL (ref 0.7–4.0)
MCH: 34.8 pg — ABNORMAL HIGH (ref 26.0–34.0)
MCHC: 34.5 g/dL (ref 30.0–36.0)
MCV: 100.8 fL — ABNORMAL HIGH (ref 80.0–100.0)
Monocytes Absolute: 0.8 10*3/uL (ref 0.1–1.0)
Monocytes Relative: 6 %
Neutro Abs: 10.1 10*3/uL — ABNORMAL HIGH (ref 1.7–7.7)
Neutrophils Relative %: 76 %
Platelets: 189 10*3/uL (ref 150–400)
RBC: 3.71 MIL/uL — ABNORMAL LOW (ref 3.87–5.11)
RDW: 14.5 % (ref 11.5–15.5)
Smear Review: NORMAL
WBC: 13.3 10*3/uL — ABNORMAL HIGH (ref 4.0–10.5)
nRBC: 0.2 % (ref 0.0–0.2)

## 2022-10-15 LAB — LIPASE, BLOOD: Lipase: 40 U/L (ref 11–51)

## 2022-10-15 LAB — TROPONIN I (HIGH SENSITIVITY): Troponin I (High Sensitivity): 10 ng/L (ref <18)

## 2022-10-15 MED ORDER — FLUCONAZOLE 100 MG PO TABS: ORAL_TABLET | ORAL | 0 refills | Status: DC

## 2022-10-15 MED ORDER — MAGIC MOUTHWASH: 5.0000 mL | Freq: Four times a day (QID) | ORAL | 0 refills | Status: AC

## 2022-10-15 NOTE — ED Provider Notes (Signed)
[facility] Provider Note    Event Date/Time   First MD Initiated Contact with Patient 10/15/22 1528     (approximate)   History   Dysphagia (Pt. To ED via POV for difficulty swallowing since having covid 1 month ago. Pt. States she feels a burning sensation midsternal, and it makes swallowing food difficult. Pt. Ate chicken fingers and cheese sticks last night and a donut and mcdonalds hot cakes for breakfast. Pt. States she has had trouble swallowing in the past, and has hx of hiatal hernia. ) and Foreign Body   HPI  Emma Gould is a 66 y.o. female who presents to the emergency department with difficulty swallowing.  Endorses a burning sensation that is constant and ongoing to her mouth and her chest.  States that it hurts whenever she eats solids or liquids.  She was taking omeprazole 40 mg and has doubled it over the past 1 week.  States that her symptoms have been ongoing since she had COVID approximately 1 month ago.  Did not receive any antibiotics including doxycycline at that time.  No immunosuppressive medications.  Also complaining of a burning sensation to her vaginal region.  Denies any itching.  Denies nausea or vomiting.      Physical Exam   Triage Vital Signs: ED Triage Vitals  Enc Vitals Group     BP 10/15/22 1336 (!) 103/55     Pulse Rate 10/15/22 1336 77     Resp 10/15/22 1336 18     Temp 10/15/22 1336 98 F (36.7 C)     Temp Source 10/15/22 1336 Oral     SpO2 10/15/22 1336 96 %     Weight --      Height 10/15/22 1337 5\' 3"  (1.6 m)     Head Circumference --      Peak Flow --      Pain Score 10/15/22 1337 8     Pain Loc --      Pain Edu? --      Excl. in [place]? --     Most recent vital signs: Vitals:   10/15/22 1336  BP: (!) 103/55  Pulse: 77  Resp: 18  Temp: 98 F (36.7 C)  SpO2: 96%    Physical Exam Constitutional:      Appearance: She is well-developed.  HENT:     Head: Atraumatic.     Comments: White plaques to  the tongue and the back of the oral mucosa. Eyes:     Conjunctiva/sclera: Conjunctivae normal.  Cardiovascular:     Rate and Rhythm: Regular rhythm.  Pulmonary:     Effort: No respiratory distress.  Abdominal:     General: There is no distension.  Musculoskeletal:        General: Normal range of motion.     Cervical back: Normal range of motion.  Skin:    General: Skin is warm.  Neurological:     Mental Status: She is alert. Mental status is at baseline.          IMPRESSION / MDM / ASSESSMENT AND PLAN / ED COURSE  I reviewed the triage vital signs and the nursing notes.  Differential diagnosis including HIV, oral candidiasis, esophagitis, pill esophagitis, Barrett's esophagus, he dehydration  Lab work with no significant electrolyte abnormalities.  Mild hypokalemia.  Found to have an acute kidney injury with creatinine that was elevated likely secondary to prerenal and dehydration.  Patient does endorse poor intake.  Patient most likely  with an esophageal candidiasis.  Discussed with the patient testing for HIV and she wants to follow-up with her primary care physician as an outpatient.  Recommended to the patient admission given her acute kidney injury and signs of dehydration in the setting of her esophagitis.  Patient states that she wants to try to go home and drink more fluid.  Explained the risk and benefits of worsening kidney function and electrolyte abnormalities.  She states that she will return to the emergency department if she is unable to keep up with her hydration.  Patient signed out [place]     EKG  RADIOLOGY [My interpretation of imaging: Chest x-ray with no findings of pneumonia.  No pneumomediastinum.  Chest x-ray was read as no acute findings.]    ED Results / Procedures / Treatments   Labs (all labs ordered are listed, but only abnormal results are displayed) Labs interpreted as -  Acute kidney injury.  Mild hypokalemia.   Leukocytosis.  Labs Reviewed  CBC WITH DIFFERENTIAL/PLATELET - Abnormal; Notable for the following components:      Result Value   WBC 13.3 (*)    RBC 3.71 (*)    MCV 100.8 (*)    MCH 34.8 (*)    Neutro Abs 10.1 (*)    Abs Immature Granulocytes 0.85 (*)    All other components within normal limits  COMPREHENSIVE METABOLIC PANEL - Abnormal; Notable for the following components:   Potassium 3.2 (*)    CO2 19 (*)    Glucose, Bld 104 (*)    BUN 43 (*)    Creatinine, Ser 2.13 (*)    Alkaline Phosphatase 37 (*)    GFR, Estimated 25 (*)    All other components within normal limits  LIPASE, BLOOD  TROPONIN I (HIGH SENSITIVITY)     Offered IV fluids prior to the patient leaving and she states that she does not want to wait for IV fluids.  States that she will drink fluids at home.  Patient is already increased her PPI to 40 mg twice daily.  Discussed at length that the patient needs to follow-up closely with her primary care physician on Monday and needs to follow-up with gastroenterology for an endoscopy.  We will start the patient on medication for oral candidiasis and esophageal candidiasis.  Stated to the patient that she could return at any times.   PROCEDURES:  Critical Care performed: No  Procedures  Patient's presentation is most consistent with acute presentation with potential threat to life or bodily function.   MEDICATIONS ORDERED IN ED: Medications - No data to display  FINAL CLINICAL IMPRESSION(S) / ED DIAGNOSES   Final diagnoses:  Esophagitis  Candidiasis     Rx / DC Orders   ED Discharge Orders          Ordered    fluconazole (DIFLUCAN) 100 MG tablet        10/15/22 1554    magic mouthwash SOLN  4 times daily        10/15/22 1603             Note:  This document was prepared using Dragon voice recognition software and may include unintentional dictation errors.   Nathaniel Man, MD 10/15/22 [phone]

## 2022-10-15 NOTE — ED Provider Triage Note (Signed)
Emergency Medicine Provider Triage Evaluation Note  Emma Gould , a 66 y.o. female  was evaluated in triage.  Pt complains of chest pain that feels like "knot" since she had covid 1 month ago. Has a known hiatal hernia. She is wondering if she has an ulcer. No hemoptysis/hematemesis. Also feel SOB when she is eating. Reports that she can "sometimes swallow water and her saliva. Hasnt been drooling. Reports that she has been snacking and able to swallow her snacks but reports appetite has been decreased. Ate chicken tenders and cheese sticks without problem, and had mcdonalds hot cakes today without problem as well as a donut. Describes pain as burning  Review of Systems  Positive: Sob, dysphagia Negative: fever  Physical Exam  There were no vitals taken for this visit. Gen:   Awake, no distress   Resp:  Normal effort. Speaking easily in complete sentences  MSK:   Moves extremities without difficulty  Other:    Medical Decision Making  Medically screening exam initiated at 1:30 PM.  Appropriate orders placed.  Emma Gould was informed that the remainder of the evaluation will be completed by another provider, this initial triage assessment does not replace that evaluation, and the importance of remaining in the ED until their evaluation is complete.     Marquette Old, PA-C 10/15/22 1334

## 2022-10-15 NOTE — Discharge Instructions (Addendum)
You were seen in the emergency department with difficulty swallowing.  Concerned that you have thrush/candidiasis infection of your mouth in your esophagus.  Take the antifungal medication as prescribed.  It is importantly call your primary care physician first thing on Monday to schedule close follow-up.  Follow-up with GI, you will likely need a scope.  Return to the emergency department for any signs of dehydration.

## 2022-10-15 NOTE — ED Triage Notes (Signed)
Pt. To ED via POV for difficulty swallowing since having covid 1 month ago. Pt. States she feels a burning sensation midsternal, and it makes swallowing food difficult. Pt. Ate chicken fingers and cheese sticks last night and a donut and mcdonalds hot cakes for breakfast. Pt. States she has had trouble swallowing in the past, and has hx of hiatal hernia.

## 2022-10-15 NOTE — ED Notes (Signed)
Pt decline after visit vital signs

## 2022-10-30 ENCOUNTER — Encounter (HOSPITAL_COMMUNITY): Payer: Self-pay

## 2022-10-30 ENCOUNTER — Emergency Department (HOSPITAL_COMMUNITY): Payer: Medicare Other

## 2022-10-30 ENCOUNTER — Other Ambulatory Visit: Payer: Self-pay

## 2022-10-30 ENCOUNTER — Inpatient Hospital Stay (HOSPITAL_COMMUNITY)
Admission: EM | Admit: 2022-10-30 | Discharge: 2022-11-01 | DRG: 683 | Disposition: A | Discharge status: Home / Self Care | Payer: Medicare Other | Attending: Internal Medicine | Admitting: Internal Medicine

## 2022-10-30 DIAGNOSIS — N1832 Chronic kidney disease, stage 3b: Secondary | ICD-10-CM | POA: Diagnosis present

## 2022-10-30 DIAGNOSIS — E860 Dehydration: Secondary | ICD-10-CM | POA: Insufficient documentation

## 2022-10-30 DIAGNOSIS — Z79899 Other long term (current) drug therapy: Secondary | ICD-10-CM

## 2022-10-30 DIAGNOSIS — M797 Fibromyalgia: Secondary | ICD-10-CM | POA: Diagnosis present

## 2022-10-30 DIAGNOSIS — R1013 Epigastric pain: Secondary | ICD-10-CM | POA: Diagnosis not present

## 2022-10-30 DIAGNOSIS — F17210 Nicotine dependence, cigarettes, uncomplicated: Secondary | ICD-10-CM | POA: Diagnosis present

## 2022-10-30 DIAGNOSIS — K219 Gastro-esophageal reflux disease without esophagitis: Secondary | ICD-10-CM | POA: Diagnosis present

## 2022-10-30 DIAGNOSIS — N179 Acute kidney failure, unspecified: Secondary | ICD-10-CM | POA: Diagnosis present

## 2022-10-30 DIAGNOSIS — I129 Hypertensive chronic kidney disease with stage 1 through stage 4 chronic kidney disease, or unspecified chronic kidney disease: Secondary | ICD-10-CM | POA: Diagnosis present

## 2022-10-30 DIAGNOSIS — Z8616 Personal history of COVID-19: Secondary | ICD-10-CM | POA: Diagnosis not present

## 2022-10-30 DIAGNOSIS — I10 Essential (primary) hypertension: Secondary | ICD-10-CM | POA: Diagnosis present

## 2022-10-30 DIAGNOSIS — K529 Noninfective gastroenteritis and colitis, unspecified: Secondary | ICD-10-CM | POA: Insufficient documentation

## 2022-10-30 DIAGNOSIS — K209 Esophagitis, unspecified without bleeding: Secondary | ICD-10-CM | POA: Diagnosis present

## 2022-10-30 DIAGNOSIS — E785 Hyperlipidemia, unspecified: Secondary | ICD-10-CM | POA: Diagnosis present

## 2022-10-30 DIAGNOSIS — D649 Anemia, unspecified: Secondary | ICD-10-CM | POA: Diagnosis not present

## 2022-10-30 DIAGNOSIS — K559 Vascular disorder of intestine, unspecified: Secondary | ICD-10-CM | POA: Insufficient documentation

## 2022-10-30 DIAGNOSIS — E875 Hyperkalemia: Secondary | ICD-10-CM | POA: Insufficient documentation

## 2022-10-30 DIAGNOSIS — Z981 Arthrodesis status: Secondary | ICD-10-CM | POA: Diagnosis not present

## 2022-10-30 DIAGNOSIS — Z8249 Family history of ischemic heart disease and other diseases of the circulatory system: Secondary | ICD-10-CM | POA: Diagnosis not present

## 2022-10-30 DIAGNOSIS — F411 Generalized anxiety disorder: Secondary | ICD-10-CM | POA: Diagnosis present

## 2022-10-30 DIAGNOSIS — N1831 Chronic kidney disease, stage 3a: Secondary | ICD-10-CM | POA: Diagnosis not present

## 2022-10-30 DIAGNOSIS — Z720 Tobacco use: Secondary | ICD-10-CM | POA: Diagnosis present

## 2022-10-30 DIAGNOSIS — B3781 Candidal esophagitis: Secondary | ICD-10-CM | POA: Diagnosis present

## 2022-10-30 DIAGNOSIS — Z7952 Long term (current) use of systemic steroids: Secondary | ICD-10-CM | POA: Diagnosis not present

## 2022-10-30 DIAGNOSIS — E782 Mixed hyperlipidemia: Secondary | ICD-10-CM | POA: Insufficient documentation

## 2022-10-30 DIAGNOSIS — R296 Repeated falls: Secondary | ICD-10-CM | POA: Diagnosis present

## 2022-10-30 DIAGNOSIS — R531 Weakness: Secondary | ICD-10-CM

## 2022-10-30 LAB — URINALYSIS, ROUTINE W REFLEX MICROSCOPIC
Bacteria, UA: NONE SEEN
Bilirubin Urine: NEGATIVE
Glucose, UA: NEGATIVE mg/dL
Hgb urine dipstick: NEGATIVE
Ketones, ur: NEGATIVE mg/dL
Leukocytes,Ua: NEGATIVE
Nitrite: NEGATIVE
Protein, ur: 30 mg/dL — AB
Specific Gravity, Urine: 1.018 (ref 1.005–1.030)
pH: 5 (ref 5.0–8.0)

## 2022-10-30 LAB — CBC
HCT: 38.6 % (ref 36.0–46.0)
Hemoglobin: 13.3 g/dL (ref 12.0–15.0)
MCH: 35.7 pg — ABNORMAL HIGH (ref 26.0–34.0)
MCHC: 34.5 g/dL (ref 30.0–36.0)
MCV: 103.5 fL — ABNORMAL HIGH (ref 80.0–100.0)
Platelets: 141 10*3/uL — ABNORMAL LOW (ref 150–400)
RBC: 3.73 MIL/uL — ABNORMAL LOW (ref 3.87–5.11)
RDW: 15.1 % (ref 11.5–15.5)
WBC: 13.8 10*3/uL — ABNORMAL HIGH (ref 4.0–10.5)
nRBC: 0.1 % (ref 0.0–0.2)

## 2022-10-30 LAB — HEPATIC FUNCTION PANEL
ALT: 26 U/L (ref 0–44)
AST: 17 U/L (ref 15–41)
Albumin: 3.4 g/dL — ABNORMAL LOW (ref 3.5–5.0)
Alkaline Phosphatase: 54 U/L (ref 38–126)
Bilirubin, Direct: 0.2 mg/dL (ref 0.0–0.2)
Indirect Bilirubin: 0.7 mg/dL (ref 0.3–0.9)
Total Bilirubin: 0.9 mg/dL (ref 0.3–1.2)
Total Protein: 6.3 g/dL — ABNORMAL LOW (ref 6.5–8.1)

## 2022-10-30 LAB — BASIC METABOLIC PANEL
Anion gap: 9 (ref 5–15)
BUN: 72 mg/dL — ABNORMAL HIGH (ref 8–23)
CO2: 18 mmol/L — ABNORMAL LOW (ref 22–32)
Calcium: 9.1 mg/dL (ref 8.9–10.3)
Chloride: 109 mmol/L (ref 98–111)
Creatinine, Ser: 2.84 mg/dL — ABNORMAL HIGH (ref 0.44–1.00)
GFR, Estimated: 18 mL/min — ABNORMAL LOW (ref >60)
Glucose, Bld: 115 mg/dL — ABNORMAL HIGH (ref 70–99)
Potassium: 5.6 mmol/L — ABNORMAL HIGH (ref 3.5–5.1)
Sodium: 136 mmol/L (ref 135–145)

## 2022-10-30 LAB — POTASSIUM: Potassium: 5.3 mmol/L — ABNORMAL HIGH (ref 3.5–5.1)

## 2022-10-30 MED ORDER — ONDANSETRON HCL 4 MG/2ML IJ SOLN
4.0000 mg | Freq: Four times a day (QID) | INTRAMUSCULAR | Status: DC | PRN
  Administered 2022-10-31: 4 mg via INTRAVENOUS
  Filled 2022-10-30: qty 2

## 2022-10-30 MED ORDER — ROSUVASTATIN CALCIUM 20 MG PO TABS
20.0000 mg | ORAL_TABLET | Freq: Every day | ORAL | Status: DC
  Administered 2022-10-31 – 2022-11-01 (×2): 20 mg via ORAL
  Filled 2022-10-30 (×2): qty 1

## 2022-10-30 MED ORDER — VENLAFAXINE HCL ER 75 MG PO CP24
75.0000 mg | ORAL_CAPSULE | Freq: Every day | ORAL | Status: DC
  Administered 2022-10-31 – 2022-11-01 (×2): 75 mg via ORAL
  Filled 2022-10-30 (×2): qty 1

## 2022-10-30 MED ORDER — CARVEDILOL 12.5 MG PO TABS
25.0000 mg | ORAL_TABLET | Freq: Two times a day (BID) | ORAL | Status: DC
  Administered 2022-10-31: 25 mg via ORAL
  Filled 2022-10-30: qty 2

## 2022-10-30 MED ORDER — GABAPENTIN 400 MG PO CAPS
400.0000 mg | ORAL_CAPSULE | Freq: Three times a day (TID) | ORAL | Status: DC
  Administered 2022-10-31: 400 mg via ORAL
  Filled 2022-10-30 (×2): qty 1

## 2022-10-30 MED ORDER — LOPERAMIDE HCL 2 MG PO CAPS: 2.0000 mg | ORAL_CAPSULE | ORAL | Status: DC | PRN

## 2022-10-30 MED ORDER — METRONIDAZOLE 500 MG/100ML IV SOLN
500.0000 mg | Freq: Two times a day (BID) | INTRAVENOUS | Status: DC
  Administered 2022-10-30 – 2022-11-01 (×4): 500 mg via INTRAVENOUS
  Filled 2022-10-30 (×4): qty 100

## 2022-10-30 MED ORDER — SODIUM CHLORIDE 0.9 % IV SOLN: INTRAVENOUS | Status: DC

## 2022-10-30 MED ORDER — INFLUENZA VAC SPLIT QUAD 0.5 ML IM SUSY: 0.5000 mL | PREFILLED_SYRINGE | INTRAMUSCULAR | Status: DC | PRN

## 2022-10-30 MED ORDER — SODIUM CHLORIDE 0.9 % IV SOLN
2.0000 g | INTRAVENOUS | Status: DC
  Administered 2022-10-30 – 2022-10-31 (×2): 2 g via INTRAVENOUS
  Filled 2022-10-30 (×2): qty 20

## 2022-10-30 MED ORDER — ONDANSETRON HCL 4 MG PO TABS: 4.0000 mg | ORAL_TABLET | Freq: Four times a day (QID) | ORAL | Status: DC | PRN

## 2022-10-30 MED ORDER — SODIUM ZIRCONIUM CYCLOSILICATE 5 G PO PACK
5.0000 g | PACK | Freq: Once | ORAL | Status: AC
  Administered 2022-10-30: 5 g via ORAL
  Filled 2022-10-30: qty 1

## 2022-10-30 MED ORDER — ENSURE ENLIVE PO LIQD: 237.0000 mL | Freq: Two times a day (BID) | ORAL | Status: DC

## 2022-10-30 MED ORDER — ENOXAPARIN SODIUM 30 MG/0.3ML IJ SOSY
30.0000 mg | PREFILLED_SYRINGE | INTRAMUSCULAR | Status: DC
  Administered 2022-10-30 – 2022-10-31 (×2): 30 mg via SUBCUTANEOUS
  Filled 2022-10-30 (×2): qty 0.3

## 2022-10-30 NOTE — ED Triage Notes (Signed)
Pt has hx of infection that goes from her throat to her stomach (pt daughter unsure the name of it). Pt is getting weaker. Pt unable to stand. Pt unable to do ADL's. Pt reports it feels like her back and hips are broke. Pt has had multiple falls over the past 2 weeks.

## 2022-10-30 NOTE — ED Notes (Signed)
Patient requested staff to discontinue purewick

## 2022-10-30 NOTE — ED Provider Notes (Signed)
[facility] EMERGENCY DEPARTMENT Provider Note   CSN: [id number] Arrival date & time: 10/30/22  1413     History {Add pertinent medical, surgical, social history, OB history to HPI:1} Chief Complaint  Patient presents with   Weakness   Fall    Emma Gould is a 66 y.o. female.  Patient has a history of hypertension.  Patient states that she has been very weak for 2 to 3 weeks and has fallen 5 times in the last week.  She was seen on October 20 with dehydration and it was recommended that she gets admitted at that time but she did not come in.   Weakness Fall       Home Medications Prior to Admission medications   Medication Sig Start Date End Date Taking? Authorizing Provider  acetaminophen (TYLENOL) 325 MG tablet Take 2 tablets (650 mg total) by mouth every 6 (six) hours as needed for mild pain or headache (fever >/= 101). 09/05/19  Yes Cherene Altes, MD  albuterol (VENTOLIN HFA) 108 (90 Base) MCG/ACT inhaler Inhale 2 puffs into the lungs 4 (four) times daily as needed for shortness of breath.   Yes [provider]  valsartan (DIOVAN) 320 MG tablet Take 320 mg by mouth daily.   Yes [provider]  carvedilol (COREG) 25 MG tablet Take 25 mg by mouth 2 (two) times daily with a meal.    [provider]  dicyclomine (BENTYL) 20 MG tablet Take 1 tablet (20 mg total) by mouth 2 (two) times daily. 02/21/20   Nuala Alpha A, PA-C  escitalopram (LEXAPRO) 20 MG tablet Take 20 mg by mouth daily. 08/07/19   [provider]  fluconazole (DIFLUCAN) 100 MG tablet Take 4 Tablets (400 mg) for one dose, followed by 2 tablets (200 mg) daily for 2 weeks 10/15/22 10/30/22  Nathaniel Man, MD  gabapentin (NEURONTIN) 400 MG capsule Take 1 capsule (400 mg total) by mouth 3 (three) times daily. 09/05/19   Cherene Altes, MD  hydrochlorothiazide (HYDRODIURIL) 25 MG tablet Take 25 mg by mouth daily.    [provider]  hydrOXYzine (ATARAX/VISTARIL)  50 MG tablet Take 100 mg by mouth 3 (three) times daily. Patient not taking: Reported on 11/30/2021 04/25/19   [provider]  levofloxacin (LEVAQUIN) 750 MG tablet Take 1 tablet (750 mg total) by mouth daily. Patient not taking: Reported on 11/30/2021 09/18/19   Little, Wenda Overland, MD  nicotine (NICODERM CQ - DOSED IN MG/24 HOURS) 21 mg/24hr patch Place 1 patch (21 mg total) onto the skin daily. Patient not taking: Reported on 11/30/2021 09/06/19   Cherene Altes, MD  omeprazole (PRILOSEC) 40 MG capsule Take 40 mg by mouth daily. 06/11/19   [provider]  predniSONE (DELTASONE) 50 MG tablet One tablet a day 11/30/21   Fransico Meadow, PA-C  propranolol ER (INDERAL LA) 80 MG 24 hr capsule Take 80 mg by mouth daily. 06/11/19   [provider]  QUEtiapine (SEROQUEL) 25 MG tablet Take 1 tablet (25 mg total) by mouth at bedtime. Patient taking differently: Take 100 mg by mouth at bedtime. 09/05/19   Cherene Altes, MD  rosuvastatin (CRESTOR) 20 MG tablet Take 20 mg by mouth daily.    [provider]  SUMAtriptan (IMITREX) 50 MG tablet Take 50 mg by mouth as needed for migraine. Take a second tablet 2 hours later if needed; max 200mg /day 12/21/18   [provider]  triamcinolone cream (KENALOG) 0.1 % Apply 1  application topically See admin instructions. 2 to 3 times a day 08/06/19   [provider]  venlafaxine XR (EFFEXOR-XR) 75 MG 24 hr capsule Take 75 mg by mouth daily with breakfast.    [provider]      Allergies    Patient has no known allergies.    Review of Systems   Review of Systems  Neurological:  Positive for weakness.    Physical Exam Updated Vital Signs BP 103/60   Pulse 71   Temp 97.8 F (36.6 C) (Oral)   Resp 14   Ht 5\' 3"  (1.6 m)   Wt 72.6 kg   SpO2 100%   BMI 28.34 kg/m  Physical Exam  ED Results / Procedures / Treatments   Labs (all labs ordered are listed, but only abnormal results are  displayed) Labs Reviewed  BASIC METABOLIC PANEL - Abnormal; Notable for the following components:      Result Value   Potassium 5.6 (*)    CO2 18 (*)    Glucose, Bld 115 (*)    BUN 72 (*)    Creatinine, Ser 2.84 (*)    GFR, Estimated 18 (*)    All other components within normal limits  CBC - Abnormal; Notable for the following components:   WBC 13.8 (*)    RBC 3.73 (*)    MCV 103.5 (*)    MCH 35.7 (*)    Platelets 141 (*)    All other components within normal limits  URINALYSIS, ROUTINE W REFLEX MICROSCOPIC - Abnormal; Notable for the following components:   Protein, ur 30 (*)    All other components within normal limits  HEPATIC FUNCTION PANEL - Abnormal; Notable for the following components:   Total Protein 6.3 (*)    Albumin 3.4 (*)    All other components within normal limits  POTASSIUM - Abnormal; Notable for the following components:   Potassium 5.3 (*)    All other components within normal limits    EKG EKG Interpretation  Date/Time:  Saturday October 30 2022 15:30:52 EDT Ventricular Rate:  84 PR Interval:  126 QRS Duration: 85 QT Interval:  369 QTC Calculation: 437 R Axis:   76 Text Interpretation: Sinus rhythm Abnormal R-wave progression, early transition Nonspecific T abnormalities, diffuse leads Confirmed by Milton Ferguson [phone]) on 10/30/2022 3:50:07 PM  Radiology CT L-SPINE NO CHARGE  Result Date: 10/30/2022 CLINICAL DATA:  Back pain.  Multiple recent falls EXAM: CT Thoracic and Lumbar spine without contrast TECHNIQUE: Multiplanar CT images of the thoracic and lumbar spine were reconstructed from contemporary CT of the Chest, Abdomen, and Pelvis. RADIATION DOSE REDUCTION: This exam was performed according to the departmental dose-optimization program which includes automated exposure control, adjustment of the mA and/or kV according to patient size and/or use of iterative reconstruction technique. CONTRAST:  None COMPARISON:  02/21/2020, 07/07/2022 FINDINGS:  CT THORACIC SPINE FINDINGS Alignment: Normal. Vertebrae: No acute fracture or focal pathologic process. Paraspinal and other soft tissues: See dedicated CT chest. Disc levels: Intervertebral disc heights are preserved. No significant facet joint arthropathy. CT LUMBAR SPINE FINDINGS Segmentation: 5 lumbar type vertebrae. Alignment: Normal. Vertebrae: No acute fracture or focal pathologic process. Paraspinal and other soft tissues: See dedicated CT abdomen pelvis. Disc levels: Intervertebral disc heights are preserved. Mild facet arthropathy most pronounced on the left at L4-5 and L5-S1. IMPRESSION: No acute osseous abnormality of the thoracic or lumbar spine. Electronically Signed   By: Davina Poke D.O.   On: 10/30/2022 17:04  CT T-SPINE NO CHARGE  Result Date: 10/30/2022 CLINICAL DATA:  Back pain.  Multiple recent falls EXAM: CT Thoracic and Lumbar spine without contrast TECHNIQUE: Multiplanar CT images of the thoracic and lumbar spine were reconstructed from contemporary CT of the Chest, Abdomen, and Pelvis. RADIATION DOSE REDUCTION: This exam was performed according to the departmental dose-optimization program which includes automated exposure control, adjustment of the mA and/or kV according to patient size and/or use of iterative reconstruction technique. CONTRAST:  None COMPARISON:  02/21/2020, 07/07/2022 FINDINGS: CT THORACIC SPINE FINDINGS Alignment: Normal. Vertebrae: No acute fracture or focal pathologic process. Paraspinal and other soft tissues: See dedicated CT chest. Disc levels: Intervertebral disc heights are preserved. No significant facet joint arthropathy. CT LUMBAR SPINE FINDINGS Segmentation: 5 lumbar type vertebrae. Alignment: Normal. Vertebrae: No acute fracture or focal pathologic process. Paraspinal and other soft tissues: See dedicated CT abdomen pelvis. Disc levels: Intervertebral disc heights are preserved. Mild facet arthropathy most pronounced on the left at L4-5 and L5-S1.  IMPRESSION: No acute osseous abnormality of the thoracic or lumbar spine. Electronically Signed   By: Davina Poke D.O.   On: 10/30/2022 17:04   CT Chest Wo Contrast  Result Date: 10/30/2022 CLINICAL DATA:  Chest wall pain, nontraumatic, infection or inflammation suspected, xray done; Abdominal pain, acute, nonlocalized EXAM: CT CHEST, ABDOMEN AND PELVIS WITHOUT CONTRAST TECHNIQUE: Multidetector CT imaging of the chest, abdomen and pelvis was performed following the standard protocol without IV contrast. RADIATION DOSE REDUCTION: This exam was performed according to the departmental dose-optimization program which includes automated exposure control, adjustment of the mA and/or kV according to patient size and/or use of iterative reconstruction technique. COMPARISON:  CT chest 07/07/2022.  CT abdomen pelvis 02/21/2020 FINDINGS: CT CHEST FINDINGS Cardiovascular: Normal heart size. No pericardial effusion. Thoracic aorta is nonaneurysmal. Scattered atherosclerotic vascular calcifications of the aorta and coronary arteries. Central pulmonary vasculature is nondilated. Mediastinum/Nodes: No enlarged mediastinal, hilar, or axillary lymph nodes. Thyroid gland, trachea, and esophagus demonstrate no significant findings. Lungs/Pleura: Small airspace consolidation within the posterior aspect of the right lower lobe, which may represent atelectasis. There is mild dependent bibasilar atelectasis. Mild mosaic attenuation noted in the left perihilar region. Mild bronchial wall thickening. No pleural effusion or pneumothorax. Musculoskeletal: No chest wall mass or suspicious bone lesions identified. CT ABDOMEN PELVIS FINDINGS Hepatobiliary: Stable small cysts at the left hepatic dome. Liver appears otherwise unremarkable. Unremarkable gallbladder. No hyperdense gallstone. No biliary dilatation. Pancreas: Unremarkable. No pancreatic ductal dilatation or surrounding inflammatory changes. Spleen: Normal in size without focal  abnormality. Adrenals/Urinary Tract: Adrenal glands are unremarkable. Kidneys are normal, without renal calculi, focal lesion, or hydronephrosis. Bladder is unremarkable. Stomach/Bowel: Stomach within normal limits. No dilated loops of small bowel. There is some fecalization of small bowel content. Moderate diffuse colonic wall thickening involving the transverse, descending, and sigmoid colon. Mild pericolonic fat stranding, most pronounced along the left colon. Multiple air-fluid levels within the colon. A normal appendix is seen in the right lower quadrant. Vascular/Lymphatic: Aortic atherosclerosis. No enlarged abdominal or pelvic lymph nodes. Reproductive: Status post hysterectomy. No adnexal masses. Other: No free fluid. No abdominopelvic fluid collection. No pneumoperitoneum. No abdominal wall hernia. Musculoskeletal: No acute or significant osseous findings. IMPRESSION: 1. Moderate diffuse colonic wall thickening involving the transverse, descending, and sigmoid colon with mild pericolonic fat stranding. Findings are most suggestive of an infectious or inflammatory colitis. Ischemic colitis is not excluded. 2. Small airspace consolidation within the posterior aspect of the right lower lobe, which may  represent atelectasis or pneumonia. 3. Bronchial wall thickening with mild mosaic attenuation in the left perihilar region, suggestive of small airways disease. 4. Aortic and coronary artery atherosclerosis (ICD10-I70.0). Electronically Signed   By: Davina Poke D.O.   On: 10/30/2022 17:00   CT ABDOMEN PELVIS WO CONTRAST  Result Date: 10/30/2022 CLINICAL DATA:  Chest wall pain, nontraumatic, infection or inflammation suspected, xray done; Abdominal pain, acute, nonlocalized EXAM: CT CHEST, ABDOMEN AND PELVIS WITHOUT CONTRAST TECHNIQUE: Multidetector CT imaging of the chest, abdomen and pelvis was performed following the standard protocol without IV contrast. RADIATION DOSE REDUCTION: This exam was  performed according to the departmental dose-optimization program which includes automated exposure control, adjustment of the mA and/or kV according to patient size and/or use of iterative reconstruction technique. COMPARISON:  CT chest 07/07/2022.  CT abdomen pelvis 02/21/2020 FINDINGS: CT CHEST FINDINGS Cardiovascular: Normal heart size. No pericardial effusion. Thoracic aorta is nonaneurysmal. Scattered atherosclerotic vascular calcifications of the aorta and coronary arteries. Central pulmonary vasculature is nondilated. Mediastinum/Nodes: No enlarged mediastinal, hilar, or axillary lymph nodes. Thyroid gland, trachea, and esophagus demonstrate no significant findings. Lungs/Pleura: Small airspace consolidation within the posterior aspect of the right lower lobe, which may represent atelectasis. There is mild dependent bibasilar atelectasis. Mild mosaic attenuation noted in the left perihilar region. Mild bronchial wall thickening. No pleural effusion or pneumothorax. Musculoskeletal: No chest wall mass or suspicious bone lesions identified. CT ABDOMEN PELVIS FINDINGS Hepatobiliary: Stable small cysts at the left hepatic dome. Liver appears otherwise unremarkable. Unremarkable gallbladder. No hyperdense gallstone. No biliary dilatation. Pancreas: Unremarkable. No pancreatic ductal dilatation or surrounding inflammatory changes. Spleen: Normal in size without focal abnormality. Adrenals/Urinary Tract: Adrenal glands are unremarkable. Kidneys are normal, without renal calculi, focal lesion, or hydronephrosis. Bladder is unremarkable. Stomach/Bowel: Stomach within normal limits. No dilated loops of small bowel. There is some fecalization of small bowel content. Moderate diffuse colonic wall thickening involving the transverse, descending, and sigmoid colon. Mild pericolonic fat stranding, most pronounced along the left colon. Multiple air-fluid levels within the colon. A normal appendix is seen in the right lower  quadrant. Vascular/Lymphatic: Aortic atherosclerosis. No enlarged abdominal or pelvic lymph nodes. Reproductive: Status post hysterectomy. No adnexal masses. Other: No free fluid. No abdominopelvic fluid collection. No pneumoperitoneum. No abdominal wall hernia. Musculoskeletal: No acute or significant osseous findings. IMPRESSION: 1. Moderate diffuse colonic wall thickening involving the transverse, descending, and sigmoid colon with mild pericolonic fat stranding. Findings are most suggestive of an infectious or inflammatory colitis. Ischemic colitis is not excluded. 2. Small airspace consolidation within the posterior aspect of the right lower lobe, which may represent atelectasis or pneumonia. 3. Bronchial wall thickening with mild mosaic attenuation in the left perihilar region, suggestive of small airways disease. 4. Aortic and coronary artery atherosclerosis (ICD10-I70.0). Electronically Signed   By: Davina Poke D.O.   On: 10/30/2022 17:00   DG Pelvis Portable  Result Date: 10/30/2022 CLINICAL DATA:  Fall. EXAM: PORTABLE PELVIS 1-2 VIEWS COMPARISON:  None Available. FINDINGS: The cortical margins of the bony pelvis are intact. No fracture. Pubic symphysis and sacroiliac joints are congruent. Minor bilateral hip degenerative change. Both femoral heads are well-seated in the respective acetabula. IMPRESSION: No pelvic fracture. Minor bilateral hip degenerative change. Electronically Signed   By: Keith Rake M.D.   On: 10/30/2022 15:57   DG Chest Port 1 View  Result Date: 10/30/2022 CLINICAL DATA:  Fall, shortness of breath. EXAM: PORTABLE CHEST 1 VIEW COMPARISON:  Radiograph 10/15/2022 FINDINGS: The heart is  normal in size.The cardiomediastinal contours are normal. The lungs are clear. Pulmonary vasculature is normal. No consolidation, pleural effusion, or pneumothorax. No acute osseous abnormalities are seen. IMPRESSION: No acute chest findings. Electronically Signed   By: Keith Rake  M.D.   On: 10/30/2022 15:56    Procedures Procedures  {Document cardiac monitor, telemetry assessment procedure when appropriate:1}  Medications Ordered in ED Medications - No data to display  ED Course/ Medical Decision Making/ A&P                           Medical Decision Making Amount and/or Complexity of Data Reviewed Labs: ordered. Radiology: ordered. ECG/medicine tests: ordered.  Risk Decision regarding hospitalization.   Patient with AKI and frequent falls.  She will be admitted to medicine  {Document critical care time when appropriate:1} {Document review of labs and clinical decision tools ie heart score, Chads2Vasc2 etc:1}  {Document your independent review of radiology images, and any outside records:1} {Document your discussion with family members, caretakers, and with consultants:1} {Document social determinants of health affecting pt's care:1} {Document your decision making why or why not admission, treatments were needed:1} Final Clinical Impression(s) / ED Diagnoses Final diagnoses:  AKI (acute kidney injury) ([place])    Rx / DC Orders ED Discharge Orders     None

## 2022-10-30 NOTE — H&P (Signed)
History and Physical    Patient: Emma Gould ZOX:[id number] DOB: 1956-12-06 DOA: 10/30/2022 DOS: the patient was seen and examined on 10/30/2022 PCP: Doreatha Lew, MD  Patient coming from: Home  Chief Complaint:  Chief Complaint  Patient presents with   Weakness   Fall   HPI: Darika Ildefonso is a 66 y.o. female with medical history significant of hypertension, fibromyalgia.  Patient presents with history of increasing weakness over the past month.  She got COVID approximately a month ago and has been having diminished appetite, diminished fluid intake over the past month.  She was also noticed increasing lower extremity weakness that has been progressing.  She finds that it is difficult to stand from a seated position and difficulty going upstairs.  This has been worsening since she got COVID.  No other palliating or provoking factors.  She she denies fevers, cough, shortness of breath, chest pain.  She does have some abdominal cramping and has loose stools.  She was recently seen at [facility].  She was diagnosed with esophagitis and was prescribed something for oral candidiasis.  Review of Systems: As mentioned in the history of present illness. All other systems reviewed and are negative. Past Medical History:  Diagnosis Date   Fibromyalgia    Hypertension    Past Surgical History:  Procedure Laterality Date   CERVICAL FUSION     Social History:  reports that she has been smoking. She has never used smokeless tobacco. She reports that she does not drink alcohol and does not use drugs.  No Known Allergies  Family History  Problem Relation Age of Onset   Stroke Mother    Hypertension Mother    Lung cancer Father        Father died of lung cancer    Prior to Admission medications   Medication Sig Start Date End Date Taking? Authorizing Provider  acetaminophen (TYLENOL) 325 MG tablet Take 2 tablets (650 mg total) by mouth every 6 (six) hours as needed for  mild pain or headache (fever >/= 101). 09/05/19  Yes Cherene Altes, MD  albuterol (VENTOLIN HFA) 108 (90 Base) MCG/ACT inhaler Inhale 2 puffs into the lungs 4 (four) times daily as needed for shortness of breath.   Yes [provider]  valsartan (DIOVAN) 320 MG tablet Take 320 mg by mouth daily.   Yes [provider]  carvedilol (COREG) 25 MG tablet Take 25 mg by mouth 2 (two) times daily with a meal.    [provider]  dicyclomine (BENTYL) 20 MG tablet Take 1 tablet (20 mg total) by mouth 2 (two) times daily. 02/21/20   Nuala Alpha A, PA-C  escitalopram (LEXAPRO) 20 MG tablet Take 20 mg by mouth daily. 08/07/19   [provider]  fluconazole (DIFLUCAN) 100 MG tablet Take 4 Tablets (400 mg) for one dose, followed by 2 tablets (200 mg) daily for 2 weeks 10/15/22 10/30/22  Nathaniel Man, MD  gabapentin (NEURONTIN) 400 MG capsule Take 1 capsule (400 mg total) by mouth 3 (three) times daily. 09/05/19   Cherene Altes, MD  hydrochlorothiazide (HYDRODIURIL) 25 MG tablet Take 25 mg by mouth daily.    [provider]  hydrOXYzine (ATARAX/VISTARIL) 50 MG tablet Take 100 mg by mouth 3 (three) times daily. Patient not taking: Reported on 11/30/2021 04/25/19   [provider]  levofloxacin (LEVAQUIN) 750 MG tablet Take 1 tablet (750 mg total) by mouth daily. Patient not taking: Reported on 11/30/2021 09/18/19  Little, Ambrose Finland, MD  nicotine (NICODERM CQ - DOSED IN MG/24 HOURS) 21 mg/24hr patch Place 1 patch (21 mg total) onto the skin daily. Patient not taking: Reported on 11/30/2021 09/06/19   Lonia Blood, MD  omeprazole (PRILOSEC) 40 MG capsule Take 40 mg by mouth daily. 06/11/19   [provider]  predniSONE (DELTASONE) 50 MG tablet One tablet a day 11/30/21   Elson Areas, PA-C  propranolol ER (INDERAL LA) 80 MG 24 hr capsule Take 80 mg by mouth daily. 06/11/19   [provider]  QUEtiapine (SEROQUEL) 25 MG tablet Take  1 tablet (25 mg total) by mouth at bedtime. Patient taking differently: Take 100 mg by mouth at bedtime. 09/05/19   Lonia Blood, MD  rosuvastatin (CRESTOR) 20 MG tablet Take 20 mg by mouth daily.    [provider]  SUMAtriptan (IMITREX) 50 MG tablet Take 50 mg by mouth as needed for migraine. Take a second tablet 2 hours later if needed; max 200mg /day 12/21/18   [provider]  triamcinolone cream (KENALOG) 0.1 % Apply 1 application topically See admin instructions. 2 to 3 times a day 08/06/19   [provider]  venlafaxine XR (EFFEXOR-XR) 75 MG 24 hr capsule Take 75 mg by mouth daily with breakfast.    [provider]    Physical Exam: Vitals:   10/30/22 1420 10/30/22 1600 10/30/22 1846  BP: 96/66 103/60 (!) 101/59  Pulse: 82 71 80  Resp: 18 14 14   Temp: 97.8 F (36.6 C)  97.8 F (36.6 C)  TempSrc: Oral  Oral  SpO2: 100% 100% 100%  Weight: 72.6 kg    Height: 5\' 3"  (1.6 m)     General: Elderly female. Awake and alert and oriented x3. No acute cardiopulmonary distress.  HEENT: Normocephalic atraumatic.  Right and left ears normal in appearance.  Pupils equal, round, reactive to light. Extraocular muscles are intact. Sclerae anicteric and noninjected.  Moist mucosal membranes. No mucosal lesions.  Neck: Neck supple without lymphadenopathy. No carotid bruits. No masses palpated.  Cardiovascular: Regular rate with normal S1-S2 sounds. No murmurs, rubs, gallops auscultated. No JVD.  Respiratory: Good respiratory effort with no wheezes, rales, rhonchi. Lungs clear to auscultation bilaterally.  No accessory muscle use. Abdomen: Soft, mild diffuse tenderness.  No rebound or guarding. nondistended. Active bowel sounds. No masses or hepatosplenomegaly  Skin: No rashes, lesions, or ulcerations.  Dry, warm to touch. 2+ dorsalis pedis and radial pulses. Musculoskeletal: No calf or leg pain. All major joints not erythematous nontender.  No upper or lower joint  deformation.  Good ROM.  No contractures  Psychiatric: Intact judgment and insight. Pleasant and cooperative. Neurologic: No focal neurological deficits. Strength is 5/5 and symmetric in upper and lower extremities.  Cranial nerves II through XII are grossly intact.  Data Reviewed: Results for orders placed or performed during the hospital encounter of 10/30/22 (from the past 24 hour(s))  Urinalysis, Routine w reflex microscopic Urine, Clean Catch     Status: Abnormal   Collection Time: 10/30/22  2:24 PM  Result Value Ref Range   Color, Urine YELLOW YELLOW   APPearance CLEAR CLEAR   Specific Gravity, Urine 1.018 1.005 - 1.030   pH 5.0 5.0 - 8.0   Glucose, UA NEGATIVE NEGATIVE mg/dL   Hgb urine dipstick NEGATIVE NEGATIVE   Bilirubin Urine NEGATIVE NEGATIVE   Ketones, ur NEGATIVE NEGATIVE mg/dL   Protein, ur 30 (A) NEGATIVE mg/dL   Nitrite NEGATIVE NEGATIVE  Leukocytes,Ua NEGATIVE NEGATIVE   RBC / HPF 0-5 0 - 5 RBC/hpf   WBC, UA 0-5 0 - 5 WBC/hpf   Bacteria, UA NONE SEEN NONE SEEN   Mucus PRESENT    Hyaline Casts, UA PRESENT   Basic metabolic panel     Status: Abnormal   Collection Time: 10/30/22  2:44 PM  Result Value Ref Range   Sodium 136 135 - 145 mmol/L   Potassium 5.6 (H) 3.5 - 5.1 mmol/L   Chloride 109 98 - 111 mmol/L   CO2 18 (L) 22 - 32 mmol/L   Glucose, Bld 115 (H) 70 - 99 mg/dL   BUN 72 (H) 8 - 23 mg/dL   Creatinine, Ser 4.54 (H) 0.44 - 1.00 mg/dL   Calcium 9.1 8.9 - 09.8 mg/dL   GFR, Estimated 18 (L) >60 mL/min   Anion gap 9 5 - 15  CBC     Status: Abnormal   Collection Time: 10/30/22  2:44 PM  Result Value Ref Range   WBC 13.8 (H) 4.0 - 10.5 K/uL   RBC 3.73 (L) 3.87 - 5.11 MIL/uL   Hemoglobin 13.3 12.0 - 15.0 g/dL   HCT 11.9 14.7 - 82.9 %   MCV 103.5 (H) 80.0 - 100.0 fL   MCH 35.7 (H) 26.0 - 34.0 pg   MCHC 34.5 30.0 - 36.0 g/dL   RDW 56.2 13.0 - 86.5 %   Platelets 141 (L) 150 - 400 K/uL   nRBC 0.1 0.0 - 0.2 %  Hepatic function panel     Status: Abnormal    Collection Time: 10/30/22  3:15 PM  Result Value Ref Range   Total Protein 6.3 (L) 6.5 - 8.1 g/dL   Albumin 3.4 (L) 3.5 - 5.0 g/dL   AST 17 15 - 41 U/L   ALT 26 0 - 44 U/L   Alkaline Phosphatase 54 38 - 126 U/L   Total Bilirubin 0.9 0.3 - 1.2 mg/dL   Bilirubin, Direct 0.2 0.0 - 0.2 mg/dL   Indirect Bilirubin 0.7 0.3 - 0.9 mg/dL  Potassium     Status: Abnormal   Collection Time: 10/30/22  3:58 PM  Result Value Ref Range   Potassium 5.3 (H) 3.5 - 5.1 mmol/L   CT L-SPINE NO CHARGE  Result Date: 10/30/2022 CLINICAL DATA:  Back pain.  Multiple recent falls EXAM: CT Thoracic and Lumbar spine without contrast TECHNIQUE: Multiplanar CT images of the thoracic and lumbar spine were reconstructed from contemporary CT of the Chest, Abdomen, and Pelvis. RADIATION DOSE REDUCTION: This exam was performed according to the departmental dose-optimization program which includes automated exposure control, adjustment of the mA and/or kV according to patient size and/or use of iterative reconstruction technique. CONTRAST:  None COMPARISON:  02/21/2020, 07/07/2022 FINDINGS: CT THORACIC SPINE FINDINGS Alignment: Normal. Vertebrae: No acute fracture or focal pathologic process. Paraspinal and other soft tissues: See dedicated CT chest. Disc levels: Intervertebral disc heights are preserved. No significant facet joint arthropathy. CT LUMBAR SPINE FINDINGS Segmentation: 5 lumbar type vertebrae. Alignment: Normal. Vertebrae: No acute fracture or focal pathologic process. Paraspinal and other soft tissues: See dedicated CT abdomen pelvis. Disc levels: Intervertebral disc heights are preserved. Mild facet arthropathy most pronounced on the left at L4-5 and L5-S1. IMPRESSION: No acute osseous abnormality of the thoracic or lumbar spine. Electronically Signed   By: Duanne Guess D.O.   On: 10/30/2022 17:04   CT T-SPINE NO CHARGE  Result Date: 10/30/2022 CLINICAL DATA:  Back pain.  Multiple recent falls  EXAM: CT Thoracic  and Lumbar spine without contrast TECHNIQUE: Multiplanar CT images of the thoracic and lumbar spine were reconstructed from contemporary CT of the Chest, Abdomen, and Pelvis. RADIATION DOSE REDUCTION: This exam was performed according to the departmental dose-optimization program which includes automated exposure control, adjustment of the mA and/or kV according to patient size and/or use of iterative reconstruction technique. CONTRAST:  None COMPARISON:  02/21/2020, 07/07/2022 FINDINGS: CT THORACIC SPINE FINDINGS Alignment: Normal. Vertebrae: No acute fracture or focal pathologic process. Paraspinal and other soft tissues: See dedicated CT chest. Disc levels: Intervertebral disc heights are preserved. No significant facet joint arthropathy. CT LUMBAR SPINE FINDINGS Segmentation: 5 lumbar type vertebrae. Alignment: Normal. Vertebrae: No acute fracture or focal pathologic process. Paraspinal and other soft tissues: See dedicated CT abdomen pelvis. Disc levels: Intervertebral disc heights are preserved. Mild facet arthropathy most pronounced on the left at L4-5 and L5-S1. IMPRESSION: No acute osseous abnormality of the thoracic or lumbar spine. Electronically Signed   By: Duanne Guess D.O.   On: 10/30/2022 17:04   CT Chest Wo Contrast  Result Date: 10/30/2022 CLINICAL DATA:  Chest wall pain, nontraumatic, infection or inflammation suspected, xray done; Abdominal pain, acute, nonlocalized EXAM: CT CHEST, ABDOMEN AND PELVIS WITHOUT CONTRAST TECHNIQUE: Multidetector CT imaging of the chest, abdomen and pelvis was performed following the standard protocol without IV contrast. RADIATION DOSE REDUCTION: This exam was performed according to the departmental dose-optimization program which includes automated exposure control, adjustment of the mA and/or kV according to patient size and/or use of iterative reconstruction technique. COMPARISON:  CT chest 07/07/2022.  CT abdomen pelvis 02/21/2020 FINDINGS: CT CHEST  FINDINGS Cardiovascular: Normal heart size. No pericardial effusion. Thoracic aorta is nonaneurysmal. Scattered atherosclerotic vascular calcifications of the aorta and coronary arteries. Central pulmonary vasculature is nondilated. Mediastinum/Nodes: No enlarged mediastinal, hilar, or axillary lymph nodes. Thyroid gland, trachea, and esophagus demonstrate no significant findings. Lungs/Pleura: Small airspace consolidation within the posterior aspect of the right lower lobe, which may represent atelectasis. There is mild dependent bibasilar atelectasis. Mild mosaic attenuation noted in the left perihilar region. Mild bronchial wall thickening. No pleural effusion or pneumothorax. Musculoskeletal: No chest wall mass or suspicious bone lesions identified. CT ABDOMEN PELVIS FINDINGS Hepatobiliary: Stable small cysts at the left hepatic dome. Liver appears otherwise unremarkable. Unremarkable gallbladder. No hyperdense gallstone. No biliary dilatation. Pancreas: Unremarkable. No pancreatic ductal dilatation or surrounding inflammatory changes. Spleen: Normal in size without focal abnormality. Adrenals/Urinary Tract: Adrenal glands are unremarkable. Kidneys are normal, without renal calculi, focal lesion, or hydronephrosis. Bladder is unremarkable. Stomach/Bowel: Stomach within normal limits. No dilated loops of small bowel. There is some fecalization of small bowel content. Moderate diffuse colonic wall thickening involving the transverse, descending, and sigmoid colon. Mild pericolonic fat stranding, most pronounced along the left colon. Multiple air-fluid levels within the colon. A normal appendix is seen in the right lower quadrant. Vascular/Lymphatic: Aortic atherosclerosis. No enlarged abdominal or pelvic lymph nodes. Reproductive: Status post hysterectomy. No adnexal masses. Other: No free fluid. No abdominopelvic fluid collection. No pneumoperitoneum. No abdominal wall hernia. Musculoskeletal: No acute or  significant osseous findings. IMPRESSION: 1. Moderate diffuse colonic wall thickening involving the transverse, descending, and sigmoid colon with mild pericolonic fat stranding. Findings are most suggestive of an infectious or inflammatory colitis. Ischemic colitis is not excluded. 2. Small airspace consolidation within the posterior aspect of the right lower lobe, which may represent atelectasis or pneumonia. 3. Bronchial wall thickening with mild mosaic attenuation in the left perihilar  region, suggestive of small airways disease. 4. Aortic and coronary artery atherosclerosis (ICD10-I70.0). Electronically Signed   By: Duanne Guess D.O.   On: 10/30/2022 17:00   CT ABDOMEN PELVIS WO CONTRAST  Result Date: 10/30/2022 CLINICAL DATA:  Chest wall pain, nontraumatic, infection or inflammation suspected, xray done; Abdominal pain, acute, nonlocalized EXAM: CT CHEST, ABDOMEN AND PELVIS WITHOUT CONTRAST TECHNIQUE: Multidetector CT imaging of the chest, abdomen and pelvis was performed following the standard protocol without IV contrast. RADIATION DOSE REDUCTION: This exam was performed according to the departmental dose-optimization program which includes automated exposure control, adjustment of the mA and/or kV according to patient size and/or use of iterative reconstruction technique. COMPARISON:  CT chest 07/07/2022.  CT abdomen pelvis 02/21/2020 FINDINGS: CT CHEST FINDINGS Cardiovascular: Normal heart size. No pericardial effusion. Thoracic aorta is nonaneurysmal. Scattered atherosclerotic vascular calcifications of the aorta and coronary arteries. Central pulmonary vasculature is nondilated. Mediastinum/Nodes: No enlarged mediastinal, hilar, or axillary lymph nodes. Thyroid gland, trachea, and esophagus demonstrate no significant findings. Lungs/Pleura: Small airspace consolidation within the posterior aspect of the right lower lobe, which may represent atelectasis. There is mild dependent bibasilar  atelectasis. Mild mosaic attenuation noted in the left perihilar region. Mild bronchial wall thickening. No pleural effusion or pneumothorax. Musculoskeletal: No chest wall mass or suspicious bone lesions identified. CT ABDOMEN PELVIS FINDINGS Hepatobiliary: Stable small cysts at the left hepatic dome. Liver appears otherwise unremarkable. Unremarkable gallbladder. No hyperdense gallstone. No biliary dilatation. Pancreas: Unremarkable. No pancreatic ductal dilatation or surrounding inflammatory changes. Spleen: Normal in size without focal abnormality. Adrenals/Urinary Tract: Adrenal glands are unremarkable. Kidneys are normal, without renal calculi, focal lesion, or hydronephrosis. Bladder is unremarkable. Stomach/Bowel: Stomach within normal limits. No dilated loops of small bowel. There is some fecalization of small bowel content. Moderate diffuse colonic wall thickening involving the transverse, descending, and sigmoid colon. Mild pericolonic fat stranding, most pronounced along the left colon. Multiple air-fluid levels within the colon. A normal appendix is seen in the right lower quadrant. Vascular/Lymphatic: Aortic atherosclerosis. No enlarged abdominal or pelvic lymph nodes. Reproductive: Status post hysterectomy. No adnexal masses. Other: No free fluid. No abdominopelvic fluid collection. No pneumoperitoneum. No abdominal wall hernia. Musculoskeletal: No acute or significant osseous findings. IMPRESSION: 1. Moderate diffuse colonic wall thickening involving the transverse, descending, and sigmoid colon with mild pericolonic fat stranding. Findings are most suggestive of an infectious or inflammatory colitis. Ischemic colitis is not excluded. 2. Small airspace consolidation within the posterior aspect of the right lower lobe, which may represent atelectasis or pneumonia. 3. Bronchial wall thickening with mild mosaic attenuation in the left perihilar region, suggestive of small airways disease. 4. Aortic and  coronary artery atherosclerosis (ICD10-I70.0). Electronically Signed   By: Duanne Guess D.O.   On: 10/30/2022 17:00   DG Pelvis Portable  Result Date: 10/30/2022 CLINICAL DATA:  Fall. EXAM: PORTABLE PELVIS 1-2 VIEWS COMPARISON:  None Available. FINDINGS: The cortical margins of the bony pelvis are intact. No fracture. Pubic symphysis and sacroiliac joints are congruent. Minor bilateral hip degenerative change. Both femoral heads are well-seated in the respective acetabula. IMPRESSION: No pelvic fracture. Minor bilateral hip degenerative change. Electronically Signed   By: Narda Rutherford M.D.   On: 10/30/2022 15:57   DG Chest Port 1 View  Result Date: 10/30/2022 CLINICAL DATA:  Fall, shortness of breath. EXAM: PORTABLE CHEST 1 VIEW COMPARISON:  Radiograph 10/15/2022 FINDINGS: The heart is normal in size.The cardiomediastinal contours are normal. The lungs are clear. Pulmonary vasculature is normal. No  consolidation, pleural effusion, or pneumothorax. No acute osseous abnormalities are seen. IMPRESSION: No acute chest findings. Electronically Signed   By: Narda RutherfordMelanie  Sanford M.D.   On: 10/30/2022 15:56      Assessment and Plan: No notes have been filed under this hospital service. Service: Hospitalist  Principal Problem:   AKI (acute kidney injury) (HCC) Active Problems:   Hypertension   GERD (gastroesophageal reflux disease)   Dehydration   Weakness   Hyperkalemia   Colitis  Acute kidney injury secondary to dehydration Admit IV fluids Recheck creatinine in the morning Hold ARB and hydrochlorothiazide, both which are on the patient's medication list Hyperkalemia We will give 1 dose of Lokelma Recheck potassium in the morning Colitis Stool sample Rocephin and Flagyl due to complicating dehydration We will also give loperamide Weakness PT to evaluate.  I did discuss with the patient possibility of having to go to rehab due to deconditioning and weakness.  Patient does not want  to go to rehab at this point. GERD Continue PPI Hypertension Continue coreg   Advance Care Planning:   Code Status: Prior full code  Consults: none  Family Communication: none  Severity of Illness: The appropriate patient status for this patient is INPATIENT. Inpatient status is judged to be reasonable and necessary in order to provide the required intensity of service to ensure the patient's safety. The patient's presenting symptoms, physical exam findings, and initial radiographic and laboratory data in the context of their chronic comorbidities is felt to place them at high risk for further clinical deterioration. Furthermore, it is not anticipated that the patient will be medically stable for discharge from the hospital within 2 midnights of admission.   * I certify that at the point of admission it is my clinical judgment that the patient will require inpatient hospital care spanning beyond 2 midnights from the point of admission due to high intensity of service, high risk for further deterioration and high frequency of surveillance required.*  Author: Levie HeritageJacob J Rhylynn Perdomo, DO 10/30/2022 6:50 PM  For on call review [email or web address].

## 2022-10-31 DIAGNOSIS — Z720 Tobacco use: Secondary | ICD-10-CM

## 2022-10-31 DIAGNOSIS — N1831 Chronic kidney disease, stage 3a: Secondary | ICD-10-CM

## 2022-10-31 DIAGNOSIS — N179 Acute kidney failure, unspecified: Secondary | ICD-10-CM

## 2022-10-31 DIAGNOSIS — K529 Noninfective gastroenteritis and colitis, unspecified: Secondary | ICD-10-CM | POA: Diagnosis not present

## 2022-10-31 DIAGNOSIS — E782 Mixed hyperlipidemia: Secondary | ICD-10-CM | POA: Insufficient documentation

## 2022-10-31 DIAGNOSIS — F411 Generalized anxiety disorder: Secondary | ICD-10-CM | POA: Insufficient documentation

## 2022-10-31 DIAGNOSIS — R1013 Epigastric pain: Secondary | ICD-10-CM

## 2022-10-31 DIAGNOSIS — K559 Vascular disorder of intestine, unspecified: Secondary | ICD-10-CM | POA: Insufficient documentation

## 2022-10-31 DIAGNOSIS — E875 Hyperkalemia: Secondary | ICD-10-CM | POA: Diagnosis not present

## 2022-10-31 LAB — CBC
HCT: 37.1 % (ref 36.0–46.0)
Hemoglobin: 13 g/dL (ref 12.0–15.0)
MCH: 36 pg — ABNORMAL HIGH (ref 26.0–34.0)
MCHC: 35 g/dL (ref 30.0–36.0)
MCV: 102.8 fL — ABNORMAL HIGH (ref 80.0–100.0)
Platelets: 124 10*3/uL — ABNORMAL LOW (ref 150–400)
RBC: 3.61 MIL/uL — ABNORMAL LOW (ref 3.87–5.11)
RDW: 14.7 % (ref 11.5–15.5)
WBC: 13.9 10*3/uL — ABNORMAL HIGH (ref 4.0–10.5)
nRBC: 0.4 % — ABNORMAL HIGH (ref 0.0–0.2)

## 2022-10-31 LAB — BASIC METABOLIC PANEL
Anion gap: 9 (ref 5–15)
BUN: 61 mg/dL — ABNORMAL HIGH (ref 8–23)
CO2: 17 mmol/L — ABNORMAL LOW (ref 22–32)
Calcium: 8.5 mg/dL — ABNORMAL LOW (ref 8.9–10.3)
Chloride: 112 mmol/L — ABNORMAL HIGH (ref 98–111)
Creatinine, Ser: 2.03 mg/dL — ABNORMAL HIGH (ref 0.44–1.00)
GFR, Estimated: 27 mL/min — ABNORMAL LOW (ref >60)
Glucose, Bld: 88 mg/dL (ref 70–99)
Potassium: 4 mmol/L (ref 3.5–5.1)
Sodium: 138 mmol/L (ref 135–145)

## 2022-10-31 MED ORDER — OXYCODONE-ACETAMINOPHEN 5-325 MG PO TABS
1.0000 | ORAL_TABLET | ORAL | Status: DC | PRN
  Administered 2022-10-31 – 2022-11-01 (×3): 1 via ORAL
  Filled 2022-10-31 (×3): qty 1

## 2022-10-31 NOTE — Consult Note (Signed)
Maylon Peppers, M.D. Gastroenterology & Hepatology                                           Patient Name: Emma Gould Account #: _0 @   MRN: [id number] Admission Date: 10/30/2022 Date of Evaluation:  10/31/2022 Time of Evaluation: 11:55 AM   Referring Physician: Orson Eva, MD  Chief Complaint:  colitis  HPI:  This is a 66 y.o. female with history of FBM and HTN, who comes to the hospital for evaluation of abdominal pain.  Patient reports that she was doing relatively well until a month ago when she got COVID.  The patient states that 2 weeks later she presented diffuse abdominal pain and feeling very weak.  Denied having any overt diarrhea although her stools were a little loose.  She denied having any fever, chills, abdominal distention, nausea or vomiting.  However, she complained of worsening decreased appetite and was having very poor oral intake due to this  The patient was also presenting some chest pain along with her abdominal pain.  Due to this, she decided to come to the ER on 10/15/2022.  She was found to have some difficulty swallowing and odynophagia.  It was considered that she had a possible diagnosis of candidiasis.  She was given a 2-week course of fluconazole.  She reported since then her chest pain has resolved and she does not have any issues with swallowing.  States last colonoscopy was performed 5 years ago, reports she had some polyps but no other abnormalities.  No reports are available.  In the ED, she was HD stable although at her blood pressure was soft 103/60, and afebrile. Labs were remarkable for leukocytosis of 13,800 with hemoglobin of 13.3 and platelets of 141, CMP showed potassium of 5.6 with creatinine of 2.84, rest of electrolytes were within normal limits, liver panel was also normal.  Urinalysis was normal.  CT abdomen and pelvis without IV contrast showed presence of diffuse colonic wall thickening involving the transverse, descending and sigmoid  colon concerning for inflammatory versus ischemic causes.    Past Medical History: SEE CHRONIC ISSSUES: Past Medical History:  Diagnosis Date   Fibromyalgia    Hypertension    Past Surgical History:  Past Surgical History:  Procedure Laterality Date   CERVICAL FUSION     Family History:  Family History  Problem Relation Age of Onset   Stroke Mother    Hypertension Mother    Lung cancer Father        Father died of lung cancer   Social History:  Social History   Tobacco Use   Smoking status: Every Day   Smokeless tobacco: Never  Substance Use Topics   Alcohol use: Never   Drug use: Never    Home Medications:  Prior to Admission medications   Medication Sig Start Date End Date Taking? Authorizing Provider  acetaminophen (TYLENOL) 325 MG tablet Take 2 tablets (650 mg total) by mouth every 6 (six) hours as needed for mild pain or headache (fever >/= 101). 09/05/19  Yes Cherene Altes, MD  albuterol (VENTOLIN HFA) 108 (90 Base) MCG/ACT inhaler Inhale 2 puffs into the lungs 4 (four) times daily as needed for shortness of breath.   Yes [provider]  valsartan (DIOVAN) 320 MG tablet Take 320 mg by mouth daily.   Yes [provider]  carvedilol (COREG)  25 MG tablet Take 25 mg by mouth 2 (two) times daily with a meal.    [provider]  dicyclomine (BENTYL) 20 MG tablet Take 1 tablet (20 mg total) by mouth 2 (two) times daily. 02/21/20   Nuala Alpha A, PA-C  escitalopram (LEXAPRO) 20 MG tablet Take 20 mg by mouth daily. 08/07/19   [provider]  gabapentin (NEURONTIN) 400 MG capsule Take 1 capsule (400 mg total) by mouth 3 (three) times daily. 09/05/19   Cherene Altes, MD  hydrochlorothiazide (HYDRODIURIL) 25 MG tablet Take 25 mg by mouth daily.    [provider]  hydrOXYzine (ATARAX/VISTARIL) 50 MG tablet Take 100 mg by mouth 3 (three) times daily. Patient not taking: Reported on 11/30/2021 04/25/19   [provider]  levofloxacin (LEVAQUIN) 750 MG tablet Take 1 tablet (750 mg total) by mouth daily. Patient not taking: Reported on 11/30/2021 09/18/19   Little, Wenda Overland, MD  nicotine (NICODERM CQ - DOSED IN MG/24 HOURS) 21 mg/24hr patch Place 1 patch (21 mg total) onto the skin daily. Patient not taking: Reported on 11/30/2021 09/06/19   Cherene Altes, MD  omeprazole (PRILOSEC) 40 MG capsule Take 40 mg by mouth daily. 06/11/19   [provider]  predniSONE (DELTASONE) 50 MG tablet One tablet a day 11/30/21   Fransico Meadow, PA-C  propranolol ER (INDERAL LA) 80 MG 24 hr capsule Take 80 mg by mouth daily. 06/11/19   [provider]  QUEtiapine (SEROQUEL) 25 MG tablet Take 1 tablet (25 mg total) by mouth at bedtime. Patient taking differently: Take 100 mg by mouth at bedtime. 09/05/19   Cherene Altes, MD  rosuvastatin (CRESTOR) 20 MG tablet Take 20 mg by mouth daily.    [provider]  SUMAtriptan (IMITREX) 50 MG tablet Take 50 mg by mouth as needed for migraine. Take a second tablet 2 hours later if needed; max 233m/day 12/21/18   [provider]  triamcinolone cream (KENALOG) 0.1 % Apply 1 application topically See admin instructions. 2 to 3 times a day 08/06/19   [provider]  venlafaxine XR (EFFEXOR-XR) 75 MG 24 hr capsule Take 75 mg by mouth daily with breakfast.    [provider]    Inpatient Medications:  Current Facility-Administered Medications:    0.9 %  sodium chloride infusion, , Intravenous, Continuous, STruett Mainland DO, Last Rate: 100 mL/hr at 10/31/22 0500, Infusion Verify at 10/31/22 0500   cefTRIAXone (ROCEPHIN) 2 g in sodium chloride 0.9 % 100 mL IVPB, 2 g, Intravenous, Q24H, Stinson, Jacob J, DO, Stopped at 10/30/22 2127   enoxaparin (LOVENOX) injection 30 mg, 30 mg, Subcutaneous, Q24H, Stinson, Jacob J, DO, 30 mg at 10/30/22 2222   feeding supplement (ENSURE ENLIVE / ENSURE PLUS) liquid 237 mL, 237 mL, Oral, BID BM,  Stinson, Jacob J, DO   influenza vac split quadrivalent PF (FLUARIX) injection 0.5 mL, 0.5 mL, Intramuscular, Prior to discharge, STruett Mainland DO   metroNIDAZOLE (FLAGYL) IVPB 500 mg, 500 mg, Intravenous, Q12H, STruett Mainland DO, Last Rate: 100 mL/hr at 10/31/22 0649, 500 mg at 10/31/22 0649   ondansetron (ZOFRAN) tablet 4 mg, 4 mg, Oral, Q6H PRN **OR** ondansetron (ZOFRAN) injection 4 mg, 4 mg, Intravenous, Q6H PRN, Stinson, Jacob J, DO   rosuvastatin (CRESTOR) tablet 20 mg, 20 mg, Oral, Daily, STruett Mainland DO, 20 mg at 10/31/22 0841   venlafaxine XR (EFFEXOR-XR) 24 hr capsule 75 mg, 75 mg, Oral, Q  breakfast, Truett Mainland, DO, 75 mg at 10/31/22 3524 Allergies: Patient has no known allergies.  Complete Review of Systems: GENERAL: negative for malaise, night sweats HEENT: No changes in hearing or vision, no nose bleeds or other nasal problems. NECK: Negative for lumps, goiter, pain and significant neck swelling RESPIRATORY: Negative for cough, wheezing CARDIOVASCULAR: Negative for chest pain, leg swelling, palpitations, orthopnea GI: SEE HPI MUSCULOSKELETAL: Negative for joint pain or swelling, back pain, and muscle pain. SKIN: Negative for lesions, rash PSYCH: Negative for sleep disturbance, mood disorder and recent psychosocial stressors. HEMATOLOGY Negative for prolonged bleeding, bruising easily, and swollen nodes. ENDOCRINE: Negative for cold or heat intolerance, polyuria, polydipsia and goiter. NEURO: negative for tremor, gait imbalance, syncope and seizures. The remainder of the review of systems is noncontributory.  Physical Exam: BP 115/61 (BP Location: Right Arm)   Pulse 86   Temp 98.1 F (36.7 C) (Oral)   Resp 16   Ht _0  (1.6 m)   Wt 66.7 kg   SpO2 98%   BMI 26.05 kg/m  GENERAL: The patient is AO x3, in no acute distress. HEENT: Head is normocephalic and atraumatic. EOMI are intact. Mouth is well hydrated and without lesions. NECK: Supple. No  masses LUNGS: Clear to auscultation. No presence of rhonchi/wheezing/rales. Adequate chest expansion HEART: RRR, normal s1 and s2. ABDOMEN: mildly tender in the epigastric area, no guarding, no peritoneal signs, and nondistended. BS +. No masses. EXTREMITIES: Without any cyanosis, clubbing, rash, lesions or edema. NEUROLOGIC: AOx3, no focal motor deficit. SKIN: no jaundice, no rashes  Laboratory Data CBC:     Component Value Date/Time   WBC 13.9 (H) 10/31/2022 0455   RBC 3.61 (L) 10/31/2022 0455   HGB 13.0 10/31/2022 0455   HCT 37.1 10/31/2022 0455   PLT 124 (L) 10/31/2022 0455   MCV 102.8 (H) 10/31/2022 0455   MCH 36.0 (H) 10/31/2022 0455   MCHC 35.0 10/31/2022 0455   RDW 14.7 10/31/2022 0455   LYMPHSABS 1.5 10/15/2022 1341   MONOABS 0.8 10/15/2022 1341   EOSABS 0.0 10/15/2022 1341   BASOSABS 0.1 10/15/2022 1341   COAG: No results found for: "INR", "PROTIME"  BMP:     Latest Ref Rng & Units 10/31/2022    4:55 AM 10/30/2022    3:58 PM 10/30/2022    2:44 PM  BMP  Glucose 70 - 99 mg/dL 88   115   BUN 8 - 23 mg/dL 61   72   Creatinine 0.44 - 1.00 mg/dL 2.03   2.84   Sodium 135 - 145 mmol/L 138   136   Potassium 3.5 - 5.1 mmol/L 4.0  5.3  5.6   Chloride 98 - 111 mmol/L 112   109   CO2 22 - 32 mmol/L 17   18   Calcium 8.9 - 10.3 mg/dL 8.5   9.1     HEPATIC:     Latest Ref Rng & Units 10/30/2022    3:15 PM 10/15/2022    1:41 PM 02/21/2020    2:56 PM  Hepatic Function  Total Protein 6.5 - 8.1 g/dL 6.3  6.5  6.6   Albumin 3.5 - 5.0 g/dL 3.4  3.7  3.7   AST 15 - 41 U/L _1 ALT 0 - 44 U/L _2 Alk Phosphatase 38 - 126 U/L 54  37  66   Total Bilirubin 0.3 - 1.2 mg/dL 0.9  0.4  0.4  Bilirubin, Direct 0.0 - 0.2 mg/dL 0.2       CARDIAC: No results found for: "CKTOTAL", "CKMB", "CKMBINDEX", "TROPONINI"   Imaging: I personally reviewed and interpreted the available imaging.  Assessment & Plan: Emma Gould is a 66 y.o. female with history of FBM and HTN,  who comes to the hospital for evaluation of abdominal pain.  Patient had new onset of abdominal pain shortly after having a COVID infection.  Also developed decreased appetite after this which has led to poor hydration.  She was noticed to have inflammatory findings on the most recent CT scan which upon my review are concerning for some ischemic colitis.  Fortunately, after receiving IV hydration the patient has had improvement in her symptoms and has very mild abdominal pain.  Given her leukocytosis, I agree with continuing antibiotics for a 7-day course (can continue oral course of ciprofloxacin and metronidazole) and continuing with IV hydration.  If her pain were to worsen or she has any new associated symptoms, we will consider proceeding with an inpatient colonoscopy.  We will also recommend following GI pathogen panel.  -Advance diet to GI soft diet -Continue antibiotic coverage -Follow-up GI pathogen panel -Continue hydration IV, goal for MAP above 65 mmHg  Maylon Peppers, MD [facility]astroenterology and [facility]

## 2022-10-31 NOTE — Progress Notes (Signed)
  Transition of Care (TOC) Screening Note   Patient Details  Name: Emma Gould Date of Birth: 1956/12/16   Transition of Care [facility]) CM/SW Contact:    Iona Beard, Pablo Phone Number: 10/31/2022, 11:34 AM    Transition of Care Department [facility]) has reviewed patient and no TOC needs have been identified at this time. We will continue to monitor patient advancement through interdisciplinary progression rounds. If new patient transition needs arise, please place a TOC consult.

## 2022-10-31 NOTE — Progress Notes (Signed)
PROGRESS NOTE  Emma Gould [id number] DOB: 09-26-1956 DOA: 10/30/2022 PCP: Patrecia Pour, Christean Grief, MD  Brief History:  66 year old female with history of hypertension, hyperlipidemia, fibromyalgia, depression/anxiety, tobacco abuse presenting with 1 month history of generalized weakness.  The patient contracted COVID-19 in late September 2023.  Since then, the patient has had poor p.o. intake and weakness.  She did have a period where she was feeling better, but in the last 2 weeks she states that she has had worsening weakness and difficulty even getting out of bed.  She denies any fevers, chills, chest pain, shortness breath, palpitations, vomiting, diarrhea, hematochezia, melena. She states that she has been having left-sided> right-sided abdominal pain for at least the last 2 weeks.  She states that it is off and on, not particularly affected by food.  Her last bowel movement was on 10/29/2022 without any blood or melena.  She denies any NSAIDs or other new medications. In the ED, the patient was afebrile hemodynamically stable with oxygen saturation 99% room air.  WBC 13.8, hemoglobin 13.3, platelets 141,000.  Sodium 136, potassium 5.6, bicarbonate 18, serum creatinine 2.4.  The patient was started on ceftriaxone and metronidazole and IV fluids.  GI was consulted to assist with management.   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  AF levels in colon  Hyperkalemia -Lokelma given>>improved  Essential hypertension -Holding carvedilol and monitor blood pressures  Generalized  anxiety disorder -Continue duloxetine and venlafaxine  Tobacco abuse -Tobacco cessation discussed    Family Communication:   no Family at bedside  Consultants:  GI  Code Status:  FULL   DVT Prophylaxis:   [place] Lovenox   Procedures: As Listed in Progress Note Above  Antibiotics: None      Subjective: Patient complains of abdominal pain although a little bit better than yesterday.  She feels a little bit stronger.  She denies any fevers, chills, chest pain, shortness breath, vomiting, diarrhea.  Objective: Vitals:   10/30/22 2113 10/30/22 2115 10/31/22 0102 10/31/22 0422  BP:  (!) 142/68 122/66 128/72  Pulse:  82 71 91  Resp:  18 14 14   Temp:  97.6 F (36.4 C) 98.3 F (36.8 C) 98.2 F (36.8 C)  TempSrc:  Oral Oral Oral  SpO2: 99% 99% 98% 99%  Weight: 66.7 kg     Height: 5\' 3"  (1.6 m)       Intake/Output Summary (Last 24 hours) at 10/31/2022 0847 Last data filed at 10/31/2022 0500 Gross per 24 hour  Intake 1180.39 ml  Output 575 ml  Net 605.39 ml   Weight change:  Exam:  General:  Pt is alert, follows commands appropriately, not in acute distress HEENT: No icterus, No thrush, No neck mass, [place]/AT Cardiovascular: RRR, S1/S2, no rubs, no gallops Respiratory: Diminished breath sounds.  Bibasilar rales.  No wheezing. Abdomen: Soft/+BS, non tender, non distended, no guarding Extremities: No edema, No lymphangitis, No petechiae, No rashes, no synovitis   Data Reviewed: I have personally reviewed following labs and imaging studies Basic Metabolic Panel: Recent Labs  Lab 10/30/22 1444 10/30/22 1558 10/31/22 0455  NA 136  --  138  K 5.6* 5.3* 4.0  CL 109  --  112*  CO2 18*  --  17*  GLUCOSE 115*  --  88  BUN 72*  --  61*  CREATININE 2.84*  --  2.03*  CALCIUM 9.1  --  8.5*   Liver Function Tests: Recent Labs  Lab 10/30/22 1515  AST 17  ALT 26  ALKPHOS 54  BILITOT 0.9  PROT 6.3*  ALBUMIN 3.4*   No results for input(s): "LIPASE", "AMYLASE" in  the last 168 hours. No results for input(s): "AMMONIA" in the last 168 hours. Coagulation Profile: No results for input(s): "INR", "PROTIME" in the last 168 hours. CBC: Recent Labs  Lab 10/30/22 1444 10/31/22 0455  WBC 13.8* 13.9*  HGB 13.3 13.0  HCT 38.6 37.1  MCV 103.5* 102.8*  PLT 141* 124*   Cardiac Enzymes: No results for input(s): "CKTOTAL", "CKMB", "CKMBINDEX", "TROPONINI" in the last 168 hours. BNP: Invalid input(s): "POCBNP" CBG: No results for input(s): "GLUCAP" in the last 168 hours. HbA1C: No results for input(s): "HGBA1C" in the last 72 hours. Urine analysis:    Component Value Date/Time   COLORURINE YELLOW 10/30/2022 1424   APPEARANCEUR CLEAR 10/30/2022 1424   LABSPEC 1.018 10/30/2022 1424   PHURINE 5.0 10/30/2022 1424   GLUCOSEU NEGATIVE 10/30/2022 1424   HGBUR NEGATIVE 10/30/2022 1424   BILIRUBINUR NEGATIVE 10/30/2022 1424   KETONESUR NEGATIVE 10/30/2022 1424   PROTEINUR 30 (A) 10/30/2022 1424   NITRITE NEGATIVE 10/30/2022 1424   LEUKOCYTESUR NEGATIVE 10/30/2022 1424   Sepsis Labs: @LABRCNTIP (procalcitonin:4,lacticidven:4) )No results found for this or any previous visit (from the past 240 hour(s)).   Scheduled Meds:  carvedilol  25 mg Oral BID WC   enoxaparin (LOVENOX) injection  30 mg Subcutaneous Q24H   feeding supplement  237 mL Oral BID BM   gabapentin  400 mg Oral TID   rosuvastatin  20 mg Oral Daily   venlafaxine XR  75 mg Oral Q breakfast   Continuous Infusions:  sodium chloride 100 mL/hr at 10/31/22 0500   cefTRIAXone (ROCEPHIN)  IV Stopped (10/30/22 2127)   metronidazole 500 mg (10/31/22 0649)    Procedures/Studies: CT L-SPINE NO CHARGE  Result Date: 10/30/2022 CLINICAL DATA:  Back pain.  Multiple recent falls EXAM: CT Thoracic and Lumbar spine without contrast TECHNIQUE: Multiplanar CT images of the thoracic and lumbar spine were reconstructed from contemporary CT of the Chest, Abdomen, and Pelvis. RADIATION DOSE REDUCTION: This  exam was performed according to the departmental dose-optimization program which includes automated exposure control, adjustment of the mA and/or kV according to patient size and/or use of iterative reconstruction technique. CONTRAST:  None COMPARISON:  02/21/2020, 07/07/2022 FINDINGS: CT THORACIC SPINE FINDINGS Alignment: Normal. Vertebrae: No acute fracture or focal pathologic process. Paraspinal and other soft tissues: See dedicated CT chest. Disc levels: Intervertebral disc heights are preserved. No significant facet joint arthropathy. CT LUMBAR SPINE FINDINGS Segmentation: 5 lumbar type vertebrae. Alignment: Normal. Vertebrae: No acute fracture or focal pathologic process. Paraspinal and other soft tissues: See dedicated CT abdomen pelvis. Disc levels: Intervertebral disc heights are preserved. Mild facet arthropathy most pronounced on the left at L4-5 and L5-S1. IMPRESSION: No acute osseous abnormality of the thoracic or lumbar spine. Electronically Signed   By: Davina Poke D.O.   On: 10/30/2022 17:04   CT T-SPINE NO CHARGE  Result Date: 10/30/2022 CLINICAL DATA:  Back pain.  Multiple recent falls EXAM: CT Thoracic and Lumbar spine without contrast TECHNIQUE: Multiplanar CT images of the thoracic and lumbar spine were reconstructed from  contemporary CT of the Chest, Abdomen, and Pelvis. RADIATION DOSE REDUCTION: This exam was performed according to the departmental dose-optimization program which includes automated exposure control, adjustment of the mA and/or kV according to patient size and/or use of iterative reconstruction technique. CONTRAST:  None COMPARISON:  02/21/2020, 07/07/2022 FINDINGS: CT THORACIC SPINE FINDINGS Alignment: Normal. Vertebrae: No acute fracture or focal pathologic process. Paraspinal and other soft tissues: See dedicated CT chest. Disc levels: Intervertebral disc heights are preserved. No significant facet joint arthropathy. CT LUMBAR SPINE FINDINGS Segmentation: 5 lumbar  type vertebrae. Alignment: Normal. Vertebrae: No acute fracture or focal pathologic process. Paraspinal and other soft tissues: See dedicated CT abdomen pelvis. Disc levels: Intervertebral disc heights are preserved. Mild facet arthropathy most pronounced on the left at L4-5 and L5-S1. IMPRESSION: No acute osseous abnormality of the thoracic or lumbar spine. Electronically Signed   By: Davina Poke D.O.   On: 10/30/2022 17:04   CT Chest Wo Contrast  Result Date: 10/30/2022 CLINICAL DATA:  Chest wall pain, nontraumatic, infection or inflammation suspected, xray done; Abdominal pain, acute, nonlocalized EXAM: CT CHEST, ABDOMEN AND PELVIS WITHOUT CONTRAST TECHNIQUE: Multidetector CT imaging of the chest, abdomen and pelvis was performed following the standard protocol without IV contrast. RADIATION DOSE REDUCTION: This exam was performed according to the departmental dose-optimization program which includes automated exposure control, adjustment of the mA and/or kV according to patient size and/or use of iterative reconstruction technique. COMPARISON:  CT chest 07/07/2022.  CT abdomen pelvis 02/21/2020 FINDINGS: CT CHEST FINDINGS Cardiovascular: Normal heart size. No pericardial effusion. Thoracic aorta is nonaneurysmal. Scattered atherosclerotic vascular calcifications of the aorta and coronary arteries. Central pulmonary vasculature is nondilated. Mediastinum/Nodes: No enlarged mediastinal, hilar, or axillary lymph nodes. Thyroid gland, trachea, and esophagus demonstrate no significant findings. Lungs/Pleura: Small airspace consolidation within the posterior aspect of the right lower lobe, which may represent atelectasis. There is mild dependent bibasilar atelectasis. Mild mosaic attenuation noted in the left perihilar region. Mild bronchial wall thickening. No pleural effusion or pneumothorax. Musculoskeletal: No chest wall mass or suspicious bone lesions identified. CT ABDOMEN PELVIS FINDINGS Hepatobiliary:  Stable small cysts at the left hepatic dome. Liver appears otherwise unremarkable. Unremarkable gallbladder. No hyperdense gallstone. No biliary dilatation. Pancreas: Unremarkable. No pancreatic ductal dilatation or surrounding inflammatory changes. Spleen: Normal in size without focal abnormality. Adrenals/Urinary Tract: Adrenal glands are unremarkable. Kidneys are normal, without renal calculi, focal lesion, or hydronephrosis. Bladder is unremarkable. Stomach/Bowel: Stomach within normal limits. No dilated loops of small bowel. There is some fecalization of small bowel content. Moderate diffuse colonic wall thickening involving the transverse, descending, and sigmoid colon. Mild pericolonic fat stranding, most pronounced along the left colon. Multiple air-fluid levels within the colon. A normal appendix is seen in the right lower quadrant. Vascular/Lymphatic: Aortic atherosclerosis. No enlarged abdominal or pelvic lymph nodes. Reproductive: Status post hysterectomy. No adnexal masses. Other: No free fluid. No abdominopelvic fluid collection. No pneumoperitoneum. No abdominal wall hernia. Musculoskeletal: No acute or significant osseous findings. IMPRESSION: 1. Moderate diffuse colonic wall thickening involving the transverse, descending, and sigmoid colon with mild pericolonic fat stranding. Findings are most suggestive of an infectious or inflammatory colitis. Ischemic colitis is not excluded. 2. Small airspace consolidation within the posterior aspect of the right lower lobe, which may represent atelectasis or pneumonia. 3. Bronchial wall thickening with mild mosaic attenuation in the left perihilar region, suggestive of small airways disease. 4. Aortic and coronary artery atherosclerosis (ICD10-I70.0). Electronically Signed   By: Davina Poke D.O.  On: 10/30/2022 17:00   CT ABDOMEN PELVIS WO CONTRAST  Result Date: 10/30/2022 CLINICAL DATA:  Chest wall pain, nontraumatic, infection or inflammation  suspected, xray done; Abdominal pain, acute, nonlocalized EXAM: CT CHEST, ABDOMEN AND PELVIS WITHOUT CONTRAST TECHNIQUE: Multidetector CT imaging of the chest, abdomen and pelvis was performed following the standard protocol without IV contrast. RADIATION DOSE REDUCTION: This exam was performed according to the departmental dose-optimization program which includes automated exposure control, adjustment of the mA and/or kV according to patient size and/or use of iterative reconstruction technique. COMPARISON:  CT chest 07/07/2022.  CT abdomen pelvis 02/21/2020 FINDINGS: CT CHEST FINDINGS Cardiovascular: Normal heart size. No pericardial effusion. Thoracic aorta is nonaneurysmal. Scattered atherosclerotic vascular calcifications of the aorta and coronary arteries. Central pulmonary vasculature is nondilated. Mediastinum/Nodes: No enlarged mediastinal, hilar, or axillary lymph nodes. Thyroid gland, trachea, and esophagus demonstrate no significant findings. Lungs/Pleura: Small airspace consolidation within the posterior aspect of the right lower lobe, which may represent atelectasis. There is mild dependent bibasilar atelectasis. Mild mosaic attenuation noted in the left perihilar region. Mild bronchial wall thickening. No pleural effusion or pneumothorax. Musculoskeletal: No chest wall mass or suspicious bone lesions identified. CT ABDOMEN PELVIS FINDINGS Hepatobiliary: Stable small cysts at the left hepatic dome. Liver appears otherwise unremarkable. Unremarkable gallbladder. No hyperdense gallstone. No biliary dilatation. Pancreas: Unremarkable. No pancreatic ductal dilatation or surrounding inflammatory changes. Spleen: Normal in size without focal abnormality. Adrenals/Urinary Tract: Adrenal glands are unremarkable. Kidneys are normal, without renal calculi, focal lesion, or hydronephrosis. Bladder is unremarkable. Stomach/Bowel: Stomach within normal limits. No dilated loops of small bowel. There is some  fecalization of small bowel content. Moderate diffuse colonic wall thickening involving the transverse, descending, and sigmoid colon. Mild pericolonic fat stranding, most pronounced along the left colon. Multiple air-fluid levels within the colon. A normal appendix is seen in the right lower quadrant. Vascular/Lymphatic: Aortic atherosclerosis. No enlarged abdominal or pelvic lymph nodes. Reproductive: Status post hysterectomy. No adnexal masses. Other: No free fluid. No abdominopelvic fluid collection. No pneumoperitoneum. No abdominal wall hernia. Musculoskeletal: No acute or significant osseous findings. IMPRESSION: 1. Moderate diffuse colonic wall thickening involving the transverse, descending, and sigmoid colon with mild pericolonic fat stranding. Findings are most suggestive of an infectious or inflammatory colitis. Ischemic colitis is not excluded. 2. Small airspace consolidation within the posterior aspect of the right lower lobe, which may represent atelectasis or pneumonia. 3. Bronchial wall thickening with mild mosaic attenuation in the left perihilar region, suggestive of small airways disease. 4. Aortic and coronary artery atherosclerosis (ICD10-I70.0). Electronically Signed   By: Davina Poke D.O.   On: 10/30/2022 17:00   DG Pelvis Portable  Result Date: 10/30/2022 CLINICAL DATA:  Fall. EXAM: PORTABLE PELVIS 1-2 VIEWS COMPARISON:  None Available. FINDINGS: The cortical margins of the bony pelvis are intact. No fracture. Pubic symphysis and sacroiliac joints are congruent. Minor bilateral hip degenerative change. Both femoral heads are well-seated in the respective acetabula. IMPRESSION: No pelvic fracture. Minor bilateral hip degenerative change. Electronically Signed   By: Keith Rake M.D.   On: 10/30/2022 15:57   DG Chest Port 1 View  Result Date: 10/30/2022 CLINICAL DATA:  Fall, shortness of breath. EXAM: PORTABLE CHEST 1 VIEW COMPARISON:  Radiograph 10/15/2022 FINDINGS: The heart  is normal in size.The cardiomediastinal contours are normal. The lungs are clear. Pulmonary vasculature is normal. No consolidation, pleural effusion, or pneumothorax. No acute osseous abnormalities are seen. IMPRESSION: No acute chest findings. Electronically Signed   By: Threasa Beards  [place] M.D.   On: 10/30/2022 15:56   DG Chest 2 View  Result Date: 10/15/2022 CLINICAL DATA:  Chest pain, shortness of breath EXAM: CHEST - 2 VIEW COMPARISON:  Chest radiograph done on 11/30/2021 and CT done on 07/07/2022 FINDINGS: Cardiac size is within normal limits. There are no signs of pulmonary edema or focal pulmonary consolidation. There is no pleural effusion or pneumothorax. IMPRESSION: No active cardiopulmonary disease. Electronically Signed   By: Elmer Picker M.D.   On: 10/15/2022 13:58    Orson Eva, DO  [facility]  If 7PM-7AM, please contact night-coverage [email or web address] Password [email or web address] 10/31/2022, 8:47 AM   LOS: 1 day

## 2022-10-31 NOTE — Progress Notes (Signed)
Pt has had minimal c/o pain to her feet. Md made aware and PRN pain medication ordered and given.

## 2022-10-31 NOTE — Hospital Course (Signed)
66 year old female with history of hypertension, hyperlipidemia, fibromyalgia, depression/anxiety, tobacco abuse presenting with 1 month history of generalized weakness.  The patient contracted COVID-19 in late September 2023.  Since then, the patient has had poor p.o. intake and weakness.  She did have a period where she was feeling better, but in the last 2 weeks she states that she has had worsening weakness and difficulty even getting out of bed.  She denies any fevers, chills, chest pain, shortness breath, palpitations, vomiting, diarrhea, hematochezia, melena. She states that she has been having left-sided> right-sided abdominal pain for at least the last 2 weeks.  She states that it is off and on, not particularly affected by food.  Her last bowel movement was on 10/29/2022 without any blood or melena.  She denies any NSAIDs or other new medications. In the ED, the patient was afebrile hemodynamically stable with oxygen saturation 99% room air.  WBC 13.8, hemoglobin 13.3, platelets 141,000.  Sodium 136, potassium 5.6, bicarbonate 18, serum creatinine 2.4.  The patient was started on ceftriaxone and metronidazole and IV fluids.  GI was consulted to assist with management.

## 2022-11-01 ENCOUNTER — Other Ambulatory Visit: Payer: Self-pay | Admitting: *Deleted

## 2022-11-01 ENCOUNTER — Telehealth: Payer: Self-pay | Admitting: Gastroenterology

## 2022-11-01 DIAGNOSIS — K559 Vascular disorder of intestine, unspecified: Secondary | ICD-10-CM

## 2022-11-01 DIAGNOSIS — D649 Anemia, unspecified: Secondary | ICD-10-CM

## 2022-11-01 LAB — MAGNESIUM: Magnesium: 1.6 mg/dL — ABNORMAL LOW (ref 1.7–2.4)

## 2022-11-01 LAB — BASIC METABOLIC PANEL
Anion gap: 7 (ref 5–15)
BUN: 46 mg/dL — ABNORMAL HIGH (ref 8–23)
CO2: 15 mmol/L — ABNORMAL LOW (ref 22–32)
Calcium: 8.2 mg/dL — ABNORMAL LOW (ref 8.9–10.3)
Chloride: 119 mmol/L — ABNORMAL HIGH (ref 98–111)
Creatinine, Ser: 1.55 mg/dL — ABNORMAL HIGH (ref 0.44–1.00)
GFR, Estimated: 37 mL/min — ABNORMAL LOW (ref >60)
Glucose, Bld: 87 mg/dL (ref 70–99)
Potassium: 3.7 mmol/L (ref 3.5–5.1)
Sodium: 141 mmol/L (ref 135–145)

## 2022-11-01 LAB — GASTROINTESTINAL PANEL BY PCR, STOOL (REPLACES STOOL CULTURE)

## 2022-11-01 LAB — CBC
HCT: 32.5 % — ABNORMAL LOW (ref 36.0–46.0)
Hemoglobin: 11.2 g/dL — ABNORMAL LOW (ref 12.0–15.0)
MCH: 35.7 pg — ABNORMAL HIGH (ref 26.0–34.0)
MCHC: 34.5 g/dL (ref 30.0–36.0)
MCV: 103.5 fL — ABNORMAL HIGH (ref 80.0–100.0)
Platelets: 111 10*3/uL — ABNORMAL LOW (ref 150–400)
RBC: 3.14 MIL/uL — ABNORMAL LOW (ref 3.87–5.11)
RDW: 14.8 % (ref 11.5–15.5)
WBC: 12.9 10*3/uL — ABNORMAL HIGH (ref 4.0–10.5)
nRBC: 0.2 % (ref 0.0–0.2)

## 2022-11-01 MED ORDER — FLUCONAZOLE 150 MG PO TABS: 150.0000 mg | ORAL_TABLET | Freq: Every day | ORAL | 0 refills | Status: DC

## 2022-11-01 MED ORDER — AMOXICILLIN-POT CLAVULANATE 875-125 MG PO TABS: 1.0000 | ORAL_TABLET | Freq: Two times a day (BID) | ORAL | 0 refills | Status: DC

## 2022-11-01 MED ORDER — AMOXICILLIN-POT CLAVULANATE 875-125 MG PO TABS: 1.0000 | ORAL_TABLET | Freq: Two times a day (BID) | ORAL | Status: DC

## 2022-11-01 MED ORDER — ENOXAPARIN SODIUM 40 MG/0.4ML IJ SOSY: 40.0000 mg | PREFILLED_SYRINGE | INTRAMUSCULAR | Status: DC

## 2022-11-01 NOTE — Telephone Encounter (Signed)
Please make patient a hospital follow up in 3-4 weeks.   Emma Gould - Please have patient get a CBC in 1 week. WE:[id number], ischemic colitis.   Venetia Night, MSN, APRN, FNP-BC, AGACNP-BC [facility] at [facility]

## 2022-11-01 NOTE — Telephone Encounter (Signed)
Labs entered into Epic and mailed lab requisitions.

## 2022-11-01 NOTE — Discharge Summary (Signed)
Physician Discharge Summary   Patient: Emma Gould MRN: [id number] DOB: [birth year]  Admit date:     10/30/2022  Discharge date: 11/01/22  Discharge Physician: Shanon Brow Kaegan Stigler   PCP: Patrecia Pour, Christean Grief, MD   Recommendations at discharge:   Please follow up with primary care provider within 1-2 weeks  Please repeat BMP and CBC in one week     Hospital Course: 66 year old female with history of hypertension, hyperlipidemia, fibromyalgia, depression/anxiety, tobacco abuse presenting with 1 month history of generalized weakness.  The patient contracted COVID-19 in late September 2023.  Since then, the patient has had poor p.o. intake and weakness.  She did have a period where she was feeling better, but in the last 2 weeks she states that she has had worsening weakness and difficulty even getting out of bed.  She denies any fevers, chills, chest pain, shortness breath, palpitations, vomiting, diarrhea, hematochezia, melena. She states that she has been having left-sided> right-sided abdominal pain for at least the last 2 weeks.  She states that it is off and on, not particularly affected by food.  Her last bowel movement was on 10/29/2022 without any blood or melena.  She denies any NSAIDs or other new medications. In the ED, the patient was afebrile hemodynamically stable with oxygen saturation 99% room air.  WBC 13.8, hemoglobin 13.3, platelets 141,000.  Sodium 136, potassium 5.6, bicarbonate 18, serum creatinine 2.4.  The patient was started on ceftriaxone and metronidazole and IV fluids.  GI was consulted to assist with management.  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  AF levels in colon 11/6--discussed with GI, cleared for d/c>>out pt follow up -d/c home with amox/clav x 6 more days   Hyperkalemia -Lokelma given>>improved   Essential hypertension -Holding carvedilol and monitor blood pressures -as BP trending back up>>restart coreg after d/c   Generalized anxiety disorder -Continue duloxetine and venlafaxine   Tobacco abuse -Tobacco cessation discussed       Consultants: GI Procedures performed: none  Disposition: Home Diet recommendation:  soft DISCHARGE MEDICATION: Allergies as of 11/01/2022   No Known Allergies      Medication List     STOP taking these medications    hydrochlorothiazide 25 MG tablet Commonly known as: HYDRODIURIL   valsartan 320 MG tablet Commonly known as: DIOVAN       TAKE these medications    acetaminophen 325 MG tablet Commonly known as: TYLENOL Take 2 tablets (650 mg total) by mouth every 6 (six) hours as needed for mild pain or headache (fever >/= 101).   albuterol 108 (90 Base) MCG/ACT inhaler Commonly known as: VENTOLIN HFA Inhale 2 puffs into the lungs 4 (four) times daily as needed for shortness of breath.   amoxicillin-clavulanate 875-125 MG tablet Commonly known as: AUGMENTIN Take 1 tablet by mouth every 12 (twelve) hours.   carvedilol 25 MG tablet Commonly known as: COREG Take 25 mg by mouth daily.   cyanocobalamin 1000 MCG/ML injection Commonly known as: VITAMIN B12 Inject 1,000 mcg into the muscle once a week.   DULoxetine 60 MG capsule Commonly  known as: CYMBALTA Take 60 mg by mouth 2 (two) times daily.   Eszopiclone 3 MG Tabs Take 3 mg by mouth at bedtime.   ezetimibe 10 MG tablet Commonly known as: ZETIA Take 10 mg by mouth daily.   fluconazole 150 MG  tablet Commonly known as: DIFLUCAN Take 1 tablet (150 mg total) by mouth daily.   omeprazole 40 MG capsule Commonly known as: PRILOSEC Take 40 mg by mouth daily.   oxyCODONE-acetaminophen 10-325 MG tablet Commonly known as: PERCOCET Take 1 tablet by mouth 4 (four) times daily as needed for pain.   QUEtiapine 25 MG tablet Commonly known as: SEROQUEL Take 1 tablet (25 mg total) by mouth at bedtime. What changed: how much to take   SUMAtriptan 50 MG tablet Commonly known as: IMITREX Take 50 mg by mouth as needed for migraine. Take a second tablet 2 hours later if needed; max 200mg /day   topiramate 50 MG tablet Commonly known as: TOPAMAX Take 50 mg by mouth daily as needed (migraine).        Discharge Exam: Filed Weights   10/30/22 1420 10/30/22 2113  Weight: 72.6 kg 66.7 kg   HEENT:  Patrick/AT, No thrush, no icterus CV:  RRR, no rub, no S3, no S4 Lung:  CTA, no wheeze, no rhonchi Abd:  soft/+BS, NT Ext:  No edema, no lymphangitis, no synovitis, no rash   Condition at discharge: stable  The results of significant diagnostics from this hospitalization (including imaging, microbiology, ancillary and laboratory) are listed below for reference.   Imaging Studies: CT L-SPINE NO CHARGE  Result Date: 10/30/2022 CLINICAL DATA:  Back pain.  Multiple recent falls EXAM: CT Thoracic and Lumbar spine without contrast TECHNIQUE: Multiplanar CT images of the thoracic and lumbar spine were reconstructed from contemporary CT of the Chest, Abdomen, and Pelvis. RADIATION DOSE REDUCTION: This exam was performed according to the departmental dose-optimization program which includes automated exposure control, adjustment of the mA and/or kV according to patient size and/or use of iterative reconstruction technique. CONTRAST:  None COMPARISON:  02/21/2020, 07/07/2022 FINDINGS: CT THORACIC SPINE FINDINGS Alignment: Normal. Vertebrae: No acute fracture or focal pathologic process. Paraspinal and other  soft tissues: See dedicated CT chest. Disc levels: Intervertebral disc heights are preserved. No significant facet joint arthropathy. CT LUMBAR SPINE FINDINGS Segmentation: 5 lumbar type vertebrae. Alignment: Normal. Vertebrae: No acute fracture or focal pathologic process. Paraspinal and other soft tissues: See dedicated CT abdomen pelvis. Disc levels: Intervertebral disc heights are preserved. Mild facet arthropathy most pronounced on the left at L4-5 and L5-S1. IMPRESSION: No acute osseous abnormality of the thoracic or lumbar spine. Electronically Signed   By: Davina Poke D.O.   On: 10/30/2022 17:04   CT T-SPINE NO CHARGE  Result Date: 10/30/2022 CLINICAL DATA:  Back pain.  Multiple recent falls EXAM: CT Thoracic and Lumbar spine without contrast TECHNIQUE: Multiplanar CT images of the thoracic and lumbar spine were reconstructed from contemporary CT of the Chest, Abdomen, and Pelvis. RADIATION DOSE REDUCTION: This exam was performed according to the departmental dose-optimization program which includes automated exposure control, adjustment of the mA and/or kV according to patient size and/or use of iterative reconstruction technique. CONTRAST:  None COMPARISON:  02/21/2020, 07/07/2022 FINDINGS: CT THORACIC SPINE FINDINGS Alignment: Normal. Vertebrae: No acute fracture or focal pathologic process. Paraspinal and other soft tissues: See dedicated CT chest. Disc levels: Intervertebral disc heights are preserved. No significant facet joint arthropathy. CT LUMBAR SPINE FINDINGS Segmentation: 5 lumbar type vertebrae. Alignment: Normal.  Vertebrae: No acute fracture or focal pathologic process. Paraspinal and other soft tissues: See dedicated CT abdomen pelvis. Disc levels: Intervertebral disc heights are preserved. Mild facet arthropathy most pronounced on the left at L4-5 and L5-S1. IMPRESSION: No acute osseous abnormality of the thoracic or lumbar spine. Electronically Signed   By: Davina Poke D.O.    On: 10/30/2022 17:04   CT Chest Wo Contrast  Result Date: 10/30/2022 CLINICAL DATA:  Chest wall pain, nontraumatic, infection or inflammation suspected, xray done; Abdominal pain, acute, nonlocalized EXAM: CT CHEST, ABDOMEN AND PELVIS WITHOUT CONTRAST TECHNIQUE: Multidetector CT imaging of the chest, abdomen and pelvis was performed following the standard protocol without IV contrast. RADIATION DOSE REDUCTION: This exam was performed according to the departmental dose-optimization program which includes automated exposure control, adjustment of the mA and/or kV according to patient size and/or use of iterative reconstruction technique. COMPARISON:  CT chest 07/07/2022.  CT abdomen pelvis 02/21/2020 FINDINGS: CT CHEST FINDINGS Cardiovascular: Normal heart size. No pericardial effusion. Thoracic aorta is nonaneurysmal. Scattered atherosclerotic vascular calcifications of the aorta and coronary arteries. Central pulmonary vasculature is nondilated. Mediastinum/Nodes: No enlarged mediastinal, hilar, or axillary lymph nodes. Thyroid gland, trachea, and esophagus demonstrate no significant findings. Lungs/Pleura: Small airspace consolidation within the posterior aspect of the right lower lobe, which may represent atelectasis. There is mild dependent bibasilar atelectasis. Mild mosaic attenuation noted in the left perihilar region. Mild bronchial wall thickening. No pleural effusion or pneumothorax. Musculoskeletal: No chest wall mass or suspicious bone lesions identified. CT ABDOMEN PELVIS FINDINGS Hepatobiliary: Stable small cysts at the left hepatic dome. Liver appears otherwise unremarkable. Unremarkable gallbladder. No hyperdense gallstone. No biliary dilatation. Pancreas: Unremarkable. No pancreatic ductal dilatation or surrounding inflammatory changes. Spleen: Normal in size without focal abnormality. Adrenals/Urinary Tract: Adrenal glands are unremarkable. Kidneys are normal, without renal calculi, focal  lesion, or hydronephrosis. Bladder is unremarkable. Stomach/Bowel: Stomach within normal limits. No dilated loops of small bowel. There is some fecalization of small bowel content. Moderate diffuse colonic wall thickening involving the transverse, descending, and sigmoid colon. Mild pericolonic fat stranding, most pronounced along the left colon. Multiple air-fluid levels within the colon. A normal appendix is seen in the right lower quadrant. Vascular/Lymphatic: Aortic atherosclerosis. No enlarged abdominal or pelvic lymph nodes. Reproductive: Status post hysterectomy. No adnexal masses. Other: No free fluid. No abdominopelvic fluid collection. No pneumoperitoneum. No abdominal wall hernia. Musculoskeletal: No acute or significant osseous findings. IMPRESSION: 1. Moderate diffuse colonic wall thickening involving the transverse, descending, and sigmoid colon with mild pericolonic fat stranding. Findings are most suggestive of an infectious or inflammatory colitis. Ischemic colitis is not excluded. 2. Small airspace consolidation within the posterior aspect of the right lower lobe, which may represent atelectasis or pneumonia. 3. Bronchial wall thickening with mild mosaic attenuation in the left perihilar region, suggestive of small airways disease. 4. Aortic and coronary artery atherosclerosis (ICD10-I70.0). Electronically Signed   By: Davina Poke D.O.   On: 10/30/2022 17:00   CT ABDOMEN PELVIS WO CONTRAST  Result Date: 10/30/2022 CLINICAL DATA:  Chest wall pain, nontraumatic, infection or inflammation suspected, xray done; Abdominal pain, acute, nonlocalized EXAM: CT CHEST, ABDOMEN AND PELVIS WITHOUT CONTRAST TECHNIQUE: Multidetector CT imaging of the chest, abdomen and pelvis was performed following the standard protocol without IV contrast. RADIATION DOSE REDUCTION: This exam was performed according to the departmental dose-optimization program which includes automated exposure control, adjustment of  the mA and/or kV according to patient size and/or use of iterative reconstruction technique. COMPARISON:  CT chest 07/07/2022.  CT abdomen pelvis 02/21/2020 FINDINGS: CT CHEST FINDINGS Cardiovascular: Normal heart size. No pericardial effusion. Thoracic aorta is nonaneurysmal. Scattered atherosclerotic vascular calcifications of the aorta and coronary arteries. Central pulmonary vasculature is nondilated. Mediastinum/Nodes: No enlarged mediastinal, hilar, or axillary lymph nodes. Thyroid gland, trachea, and esophagus demonstrate no significant findings. Lungs/Pleura: Small airspace consolidation within the posterior aspect of the right lower lobe, which may represent atelectasis. There is mild dependent bibasilar atelectasis. Mild mosaic attenuation noted in the left perihilar region. Mild bronchial wall thickening. No pleural effusion or pneumothorax. Musculoskeletal: No chest wall mass or suspicious bone lesions identified. CT ABDOMEN PELVIS FINDINGS Hepatobiliary: Stable small cysts at the left hepatic dome. Liver appears otherwise unremarkable. Unremarkable gallbladder. No hyperdense gallstone. No biliary dilatation. Pancreas: Unremarkable. No pancreatic ductal dilatation or surrounding inflammatory changes. Spleen: Normal in size without focal abnormality. Adrenals/Urinary Tract: Adrenal glands are unremarkable. Kidneys are normal, without renal calculi, focal lesion, or hydronephrosis. Bladder is unremarkable. Stomach/Bowel: Stomach within normal limits. No dilated loops of small bowel. There is some fecalization of small bowel content. Moderate diffuse colonic wall thickening involving the transverse, descending, and sigmoid colon. Mild pericolonic fat stranding, most pronounced along the left colon. Multiple air-fluid levels within the colon. A normal appendix is seen in the right lower quadrant. Vascular/Lymphatic: Aortic atherosclerosis. No enlarged abdominal or pelvic lymph nodes. Reproductive: Status  post hysterectomy. No adnexal masses. Other: No free fluid. No abdominopelvic fluid collection. No pneumoperitoneum. No abdominal wall hernia. Musculoskeletal: No acute or significant osseous findings. IMPRESSION: 1. Moderate diffuse colonic wall thickening involving the transverse, descending, and sigmoid colon with mild pericolonic fat stranding. Findings are most suggestive of an infectious or inflammatory colitis. Ischemic colitis is not excluded. 2. Small airspace consolidation within the posterior aspect of the right lower lobe, which may represent atelectasis or pneumonia. 3. Bronchial wall thickening with mild mosaic attenuation in the left perihilar region, suggestive of small airways disease. 4. Aortic and coronary artery atherosclerosis (ICD10-I70.0). Electronically Signed   By: Davina Poke D.O.   On: 10/30/2022 17:00   DG Pelvis Portable  Result Date: 10/30/2022 CLINICAL DATA:  Fall. EXAM: PORTABLE PELVIS 1-2 VIEWS COMPARISON:  None Available. FINDINGS: The cortical margins of the bony pelvis are intact. No fracture. Pubic symphysis and sacroiliac joints are congruent. Minor bilateral hip degenerative change. Both femoral heads are well-seated in the respective acetabula. IMPRESSION: No pelvic fracture. Minor bilateral hip degenerative change. Electronically Signed   By: Keith Rake M.D.   On: 10/30/2022 15:57   DG Chest Port 1 View  Result Date: 10/30/2022 CLINICAL DATA:  Fall, shortness of breath. EXAM: PORTABLE CHEST 1 VIEW COMPARISON:  Radiograph 10/15/2022 FINDINGS: The heart is normal in size.The cardiomediastinal contours are normal. The lungs are clear. Pulmonary vasculature is normal. No consolidation, pleural effusion, or pneumothorax. No acute osseous abnormalities are seen. IMPRESSION: No acute chest findings. Electronically Signed   By: Keith Rake M.D.   On: 10/30/2022 15:56   DG Chest 2 View  Result Date: 10/15/2022 CLINICAL DATA:  Chest pain, shortness of  breath EXAM: CHEST - 2 VIEW COMPARISON:  Chest radiograph done on 11/30/2021 and CT done on 07/07/2022 FINDINGS: Cardiac size is within normal limits. There are no signs of pulmonary edema or focal pulmonary consolidation. There is no pleural effusion or pneumothorax. IMPRESSION: No active cardiopulmonary disease. Electronically Signed   By: Elmer Picker M.D.   On: 10/15/2022 13:58    Microbiology: Results for orders placed or  performed during the hospital encounter of 10/30/22  Gastrointestinal Panel by PCR , Stool     Status: None   Collection Time: 10/31/22  9:21 AM   Specimen: Stool  Result Value Ref Range Status   Campylobacter species NOT DETECTED NOT DETECTED Final   Plesimonas shigelloides NOT DETECTED NOT DETECTED Final   Salmonella species NOT DETECTED NOT DETECTED Final   Yersinia enterocolitica NOT DETECTED NOT DETECTED Final   Vibrio species NOT DETECTED NOT DETECTED Final   Vibrio cholerae NOT DETECTED NOT DETECTED Final   Enteroaggregative E coli (EAEC) NOT DETECTED NOT DETECTED Final   Enteropathogenic E coli (EPEC) NOT DETECTED NOT DETECTED Final   Enterotoxigenic E coli (ETEC) NOT DETECTED NOT DETECTED Final   Shiga like toxin producing E coli (STEC) NOT DETECTED NOT DETECTED Final   Shigella/Enteroinvasive E coli (EIEC) NOT DETECTED NOT DETECTED Final   Cryptosporidium NOT DETECTED NOT DETECTED Final   Cyclospora cayetanensis NOT DETECTED NOT DETECTED Final   Entamoeba histolytica NOT DETECTED NOT DETECTED Final   Giardia lamblia NOT DETECTED NOT DETECTED Final   Adenovirus F40/41 NOT DETECTED NOT DETECTED Final   Astrovirus NOT DETECTED NOT DETECTED Final   Norovirus GI/GII NOT DETECTED NOT DETECTED Final   Rotavirus A NOT DETECTED NOT DETECTED Final   Sapovirus (I, II, IV, and V) NOT DETECTED NOT DETECTED Final    Comment: Performed at [facility], [address] [place], [place] [address]    Labs: CBC: Recent Labs  Lab 10/30/22 1444  10/31/22 0455 11/01/22 0345  WBC 13.8* 13.9* 12.9*  HGB 13.3 13.0 11.2*  HCT 38.6 37.1 32.5*  MCV 103.5* 102.8* 103.5*  PLT 141* 124* [id number]*   Basic Metabolic Panel: Recent Labs  Lab 10/30/22 1444 10/30/22 1558 10/31/22 0455 11/01/22 0345  NA 136  --  138 141  K 5.6* 5.3* 4.0 3.7  CL 109  --  112* 119*  CO2 18*  --  17* 15*  GLUCOSE 115*  --  88 87  BUN 72*  --  61* 46*  CREATININE 2.84*  --  2.03* 1.55*  CALCIUM 9.1  --  8.5* 8.2*  MG  --   --   --  1.6*   Liver Function Tests: Recent Labs  Lab 10/30/22 1515  AST 17  ALT 26  ALKPHOS 54  BILITOT 0.9  PROT 6.3*  ALBUMIN 3.4*   CBG: No results for input(s): "GLUCAP" in the last 168 hours.  Discharge time spent: greater than 30 minutes.  Signed: Orson Eva, MD [facility] 11/01/2022

## 2022-11-01 NOTE — Progress Notes (Signed)
Patient states understanding of discharge instrucitions

## 2022-11-01 NOTE — Progress Notes (Signed)
Gastroenterology Progress Note   Referring Provider: No ref. provider found Primary Care Physician:  Randel Pigg, Dorma Russell, MD Primary Gastroenterologist: previously Dr. Redgie Grayer ([facility]), Dr. Levon Hedger  Patient ID: Rondel Baton; [id number]; 11-05-1956    Subjective   Denies abdominal pain, nausea, vomiting. Continued lack of appetite but tolerating soft diet without symptoms. Has not had a bowel movement overnight. Would like to go home if possible.    Objective   Vital signs in last 24 hours Temp:  [98 F (36.7 C)-98.2 F (36.8 C)] 98.2 F (36.8 C) (11/06 0512) Pulse Rate:  [65-71] 65 (11/06 0512) Resp:  [16-20] 20 (11/06 0512) BP: (112-142)/(67-73) 142/73 (11/06 0512) SpO2:  [97 %-100 %] 100 % (11/06 0512) Last BM Date : 10/31/22  Physical Exam General:   Alert and oriented, pleasant Head:  Normocephalic and atraumatic. Eyes:  No icterus, sclera clear. Conjuctiva pink.   Abdomen:  Bowel sounds present, soft, non-tender, non-distended. No HSM or hernias noted. No rebound or guarding. No masses appreciated  Msk:  Symmetrical without gross deformities. Normal posture. Extremities:  Without clubbing or edema. Neurologic:  Alert and  oriented x4;  grossly normal neurologically. Skin:  Warm and dry, intact without significant lesions.  Psych:  Alert and cooperative. Normal mood and affect.  Intake/Output from previous day: 11/05 0701 - 11/06 0700 In: 1343 [I.V.:1042.8; IV Piggyback:300.2] Out: -  Intake/Output this shift: No intake/output data recorded.  Lab Results  Recent Labs    10/30/22 1444 10/31/22 0455 11/01/22 0345  WBC 13.8* 13.9* 12.9*  HGB 13.3 13.0 11.2*  HCT 38.6 37.1 32.5*  PLT 141* 124* 111*   BMET Recent Labs    10/30/22 1444 10/30/22 1558 10/31/22 0455 11/01/22 0345  NA 136  --  138 141  K 5.6* 5.3* 4.0 3.7  CL 109  --  112* 119*  CO2 18*  --  17* 15*  GLUCOSE 115*  --  88 87  BUN 72*  --  61* 46*   CREATININE 2.84*  --  2.03* 1.55*  CALCIUM 9.1  --  8.5* 8.2*   LFT Recent Labs    10/30/22 1515  PROT 6.3*  ALBUMIN 3.4*  AST 17  ALT 26  ALKPHOS 54  BILITOT 0.9  BILIDIR 0.2  IBILI 0.7   PT/INR No results for input(s): "LABPROT", "INR" in the last 72 hours. Hepatitis Panel No results for input(s): "HEPBSAG", "HCVAB", "HEPAIGM", "HEPBIGM" in the last 72 hours. GI pathogen panel NEGATIVE  Studies/Results CT L-SPINE NO CHARGE  Result Date: 10/30/2022 CLINICAL DATA:  Back pain.  Multiple recent falls EXAM: CT Thoracic and Lumbar spine without contrast TECHNIQUE: Multiplanar CT images of the thoracic and lumbar spine were reconstructed from contemporary CT of the Chest, Abdomen, and Pelvis. RADIATION DOSE REDUCTION: This exam was performed according to the departmental dose-optimization program which includes automated exposure control, adjustment of the mA and/or kV according to patient size and/or use of iterative reconstruction technique. CONTRAST:  None COMPARISON:  02/21/2020, 07/07/2022 FINDINGS: CT THORACIC SPINE FINDINGS Alignment: Normal. Vertebrae: No acute fracture or focal pathologic process. Paraspinal and other soft tissues: See dedicated CT chest. Disc levels: Intervertebral disc heights are preserved. No significant facet joint arthropathy. CT LUMBAR SPINE FINDINGS Segmentation: 5 lumbar type vertebrae. Alignment: Normal. Vertebrae: No acute fracture or focal pathologic process. Paraspinal and other soft tissues: See dedicated CT abdomen pelvis. Disc levels: Intervertebral disc heights are preserved. Mild facet arthropathy most pronounced on the left at L4-5  and L5-S1. IMPRESSION: No acute osseous abnormality of the thoracic or lumbar spine. Electronically Signed   By: Duanne Guess D.O.   On: 10/30/2022 17:04   CT T-SPINE NO CHARGE  Result Date: 10/30/2022 CLINICAL DATA:  Back pain.  Multiple recent falls EXAM: CT Thoracic and Lumbar spine without contrast TECHNIQUE:  Multiplanar CT images of the thoracic and lumbar spine were reconstructed from contemporary CT of the Chest, Abdomen, and Pelvis. RADIATION DOSE REDUCTION: This exam was performed according to the departmental dose-optimization program which includes automated exposure control, adjustment of the mA and/or kV according to patient size and/or use of iterative reconstruction technique. CONTRAST:  None COMPARISON:  02/21/2020, 07/07/2022 FINDINGS: CT THORACIC SPINE FINDINGS Alignment: Normal. Vertebrae: No acute fracture or focal pathologic process. Paraspinal and other soft tissues: See dedicated CT chest. Disc levels: Intervertebral disc heights are preserved. No significant facet joint arthropathy. CT LUMBAR SPINE FINDINGS Segmentation: 5 lumbar type vertebrae. Alignment: Normal. Vertebrae: No acute fracture or focal pathologic process. Paraspinal and other soft tissues: See dedicated CT abdomen pelvis. Disc levels: Intervertebral disc heights are preserved. Mild facet arthropathy most pronounced on the left at L4-5 and L5-S1. IMPRESSION: No acute osseous abnormality of the thoracic or lumbar spine. Electronically Signed   By: Duanne Guess D.O.   On: 10/30/2022 17:04   CT Chest Wo Contrast  Result Date: 10/30/2022 CLINICAL DATA:  Chest wall pain, nontraumatic, infection or inflammation suspected, xray done; Abdominal pain, acute, nonlocalized EXAM: CT CHEST, ABDOMEN AND PELVIS WITHOUT CONTRAST TECHNIQUE: Multidetector CT imaging of the chest, abdomen and pelvis was performed following the standard protocol without IV contrast. RADIATION DOSE REDUCTION: This exam was performed according to the departmental dose-optimization program which includes automated exposure control, adjustment of the mA and/or kV according to patient size and/or use of iterative reconstruction technique. COMPARISON:  CT chest 07/07/2022.  CT abdomen pelvis 02/21/2020 FINDINGS: CT CHEST FINDINGS Cardiovascular: Normal heart size. No  pericardial effusion. Thoracic aorta is nonaneurysmal. Scattered atherosclerotic vascular calcifications of the aorta and coronary arteries. Central pulmonary vasculature is nondilated. Mediastinum/Nodes: No enlarged mediastinal, hilar, or axillary lymph nodes. Thyroid gland, trachea, and esophagus demonstrate no significant findings. Lungs/Pleura: Small airspace consolidation within the posterior aspect of the right lower lobe, which may represent atelectasis. There is mild dependent bibasilar atelectasis. Mild mosaic attenuation noted in the left perihilar region. Mild bronchial wall thickening. No pleural effusion or pneumothorax. Musculoskeletal: No chest wall mass or suspicious bone lesions identified. CT ABDOMEN PELVIS FINDINGS Hepatobiliary: Stable small cysts at the left hepatic dome. Liver appears otherwise unremarkable. Unremarkable gallbladder. No hyperdense gallstone. No biliary dilatation. Pancreas: Unremarkable. No pancreatic ductal dilatation or surrounding inflammatory changes. Spleen: Normal in size without focal abnormality. Adrenals/Urinary Tract: Adrenal glands are unremarkable. Kidneys are normal, without renal calculi, focal lesion, or hydronephrosis. Bladder is unremarkable. Stomach/Bowel: Stomach within normal limits. No dilated loops of small bowel. There is some fecalization of small bowel content. Moderate diffuse colonic wall thickening involving the transverse, descending, and sigmoid colon. Mild pericolonic fat stranding, most pronounced along the left colon. Multiple air-fluid levels within the colon. A normal appendix is seen in the right lower quadrant. Vascular/Lymphatic: Aortic atherosclerosis. No enlarged abdominal or pelvic lymph nodes. Reproductive: Status post hysterectomy. No adnexal masses. Other: No free fluid. No abdominopelvic fluid collection. No pneumoperitoneum. No abdominal wall hernia. Musculoskeletal: No acute or significant osseous findings. IMPRESSION: 1. Moderate  diffuse colonic wall thickening involving the transverse, descending, and sigmoid colon with mild pericolonic fat stranding.  Findings are most suggestive of an infectious or inflammatory colitis. Ischemic colitis is not excluded. 2. Small airspace consolidation within the posterior aspect of the right lower lobe, which may represent atelectasis or pneumonia. 3. Bronchial wall thickening with mild mosaic attenuation in the left perihilar region, suggestive of small airways disease. 4. Aortic and coronary artery atherosclerosis (ICD10-I70.0). Electronically Signed   By: Duanne Guess D.O.   On: 10/30/2022 17:00   CT ABDOMEN PELVIS WO CONTRAST  Result Date: 10/30/2022 CLINICAL DATA:  Chest wall pain, nontraumatic, infection or inflammation suspected, xray done; Abdominal pain, acute, nonlocalized EXAM: CT CHEST, ABDOMEN AND PELVIS WITHOUT CONTRAST TECHNIQUE: Multidetector CT imaging of the chest, abdomen and pelvis was performed following the standard protocol without IV contrast. RADIATION DOSE REDUCTION: This exam was performed according to the departmental dose-optimization program which includes automated exposure control, adjustment of the mA and/or kV according to patient size and/or use of iterative reconstruction technique. COMPARISON:  CT chest 07/07/2022.  CT abdomen pelvis 02/21/2020 FINDINGS: CT CHEST FINDINGS Cardiovascular: Normal heart size. No pericardial effusion. Thoracic aorta is nonaneurysmal. Scattered atherosclerotic vascular calcifications of the aorta and coronary arteries. Central pulmonary vasculature is nondilated. Mediastinum/Nodes: No enlarged mediastinal, hilar, or axillary lymph nodes. Thyroid gland, trachea, and esophagus demonstrate no significant findings. Lungs/Pleura: Small airspace consolidation within the posterior aspect of the right lower lobe, which may represent atelectasis. There is mild dependent bibasilar atelectasis. Mild mosaic attenuation noted in the left  perihilar region. Mild bronchial wall thickening. No pleural effusion or pneumothorax. Musculoskeletal: No chest wall mass or suspicious bone lesions identified. CT ABDOMEN PELVIS FINDINGS Hepatobiliary: Stable small cysts at the left hepatic dome. Liver appears otherwise unremarkable. Unremarkable gallbladder. No hyperdense gallstone. No biliary dilatation. Pancreas: Unremarkable. No pancreatic ductal dilatation or surrounding inflammatory changes. Spleen: Normal in size without focal abnormality. Adrenals/Urinary Tract: Adrenal glands are unremarkable. Kidneys are normal, without renal calculi, focal lesion, or hydronephrosis. Bladder is unremarkable. Stomach/Bowel: Stomach within normal limits. No dilated loops of small bowel. There is some fecalization of small bowel content. Moderate diffuse colonic wall thickening involving the transverse, descending, and sigmoid colon. Mild pericolonic fat stranding, most pronounced along the left colon. Multiple air-fluid levels within the colon. A normal appendix is seen in the right lower quadrant. Vascular/Lymphatic: Aortic atherosclerosis. No enlarged abdominal or pelvic lymph nodes. Reproductive: Status post hysterectomy. No adnexal masses. Other: No free fluid. No abdominopelvic fluid collection. No pneumoperitoneum. No abdominal wall hernia. Musculoskeletal: No acute or significant osseous findings. IMPRESSION: 1. Moderate diffuse colonic wall thickening involving the transverse, descending, and sigmoid colon with mild pericolonic fat stranding. Findings are most suggestive of an infectious or inflammatory colitis. Ischemic colitis is not excluded. 2. Small airspace consolidation within the posterior aspect of the right lower lobe, which may represent atelectasis or pneumonia. 3. Bronchial wall thickening with mild mosaic attenuation in the left perihilar region, suggestive of small airways disease. 4. Aortic and coronary artery atherosclerosis (ICD10-I70.0).  Electronically Signed   By: Duanne Guess D.O.   On: 10/30/2022 17:00   DG Pelvis Portable  Result Date: 10/30/2022 CLINICAL DATA:  Fall. EXAM: PORTABLE PELVIS 1-2 VIEWS COMPARISON:  None Available. FINDINGS: The cortical margins of the bony pelvis are intact. No fracture. Pubic symphysis and sacroiliac joints are congruent. Minor bilateral hip degenerative change. Both femoral heads are well-seated in the respective acetabula. IMPRESSION: No pelvic fracture. Minor bilateral hip degenerative change. Electronically Signed   By: Narda Rutherford M.D.   On: 10/30/2022 15:57  DG Chest Port 1 View  Result Date: 10/30/2022 CLINICAL DATA:  Fall, shortness of breath. EXAM: PORTABLE CHEST 1 VIEW COMPARISON:  Radiograph 10/15/2022 FINDINGS: The heart is normal in size.The cardiomediastinal contours are normal. The lungs are clear. Pulmonary vasculature is normal. No consolidation, pleural effusion, or pneumothorax. No acute osseous abnormalities are seen. IMPRESSION: No acute chest findings. Electronically Signed   By: Keith Rake M.D.   On: 10/30/2022 15:56   DG Chest 2 View  Result Date: 10/15/2022 CLINICAL DATA:  Chest pain, shortness of breath EXAM: CHEST - 2 VIEW COMPARISON:  Chest radiograph done on 11/30/2021 and CT done on 07/07/2022 FINDINGS: Cardiac size is within normal limits. There are no signs of pulmonary edema or focal pulmonary consolidation. There is no pleural effusion or pneumothorax. IMPRESSION: No active cardiopulmonary disease. Electronically Signed   By: Elmer Picker M.D.   On: 10/15/2022 13:58    Assessment  66 y.o. female with a history of fibromyalgia, HTN, and resent COVID infection who presented to the hospital with generalized lower extremity weakness and decreased appetite and fluid intake for the past month along with abdominal pain and some looser stools. Reported that she was recently at [facility] ED and diagnosed with esophagitis and oral candidiasis  and advised to follow with PCP regarding endoscopy.   Abdominal pain: CT A/P without IV contrast with presence of diffuse colonic wall thickening involving the transverse, descending, and sigmoid colon concerning for inflammatory vs ischemic colitis. GI pathogen panel negative. Symptoms improved after initiation of IV fluids and antibiotics and has tolerated a soft diet. 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 Likely due for surveillance. We discussed possibility of discharge later today given resolution of symptoms but reinforced the importance of hydration to prevent recurrence of symptoms and need for outpatient follow up and she is in agreement.   Anemia: Hgb 13.3 on admission, down to 11.2 today. Likely secondary to hemodilution. Denies any melena, BRBPR, abdominal pain. Would obtain CBC in 1 week post discharge.   Esophagitis: Recently treated with fluconazole and magic mouthwash. Diagnosed with oral and esophageal candidiasis and advised to increase PPI to BID. Currently stool with decreased appetite but denies other symptoms. May consider EGD at time of colonoscopy.   Plan / Recommendations  Continue Cipro and Flagyl for total 7 days.  Reinforced importance of hydration on discharge.  Continue GI soft diet.  Continue omeprazole 40 mg BID CBC in 1 week. Follow up in clinic in 3-4 weeks to discuss outpatient colonoscopy.  Stable from GI standpoint for discharge within the next 24 hours, return for and increase in abdominal pain or rectal bleeding.     LOS: 2 days    11/01/2022, 10:47 AM   Venetia Night, MSN, FNP-BC, AGACNP-BC [facility]

## 2022-11-11 ENCOUNTER — Telehealth: Payer: Self-pay | Admitting: *Deleted

## 2022-11-11 NOTE — Telephone Encounter (Signed)
Pt called and states she is swelling up with fluid. He legs and face have gotten bigger and she feels uncomfortable. States does not want to go to hospital. Would like to treat this at home. Please advise.

## 2022-11-15 NOTE — Telephone Encounter (Signed)
Spoke to pt, she states she went to her PCP and they gave her Lasix 20mg . She states the swelling has gone down.

## 2022-11-22 ENCOUNTER — Other Ambulatory Visit: Payer: Self-pay

## 2022-11-22 ENCOUNTER — Emergency Department: Payer: Medicare Other

## 2022-11-22 ENCOUNTER — Emergency Department
Admission: EM | Admit: 2022-11-22 | Discharge: 2022-11-22 | Disposition: A | Discharge status: Home / Self Care | Payer: Medicare Other | Attending: Emergency Medicine | Admitting: Emergency Medicine

## 2022-11-22 ENCOUNTER — Encounter: Payer: Self-pay | Admitting: *Deleted

## 2022-11-22 DIAGNOSIS — R609 Edema, unspecified: Secondary | ICD-10-CM

## 2022-11-22 DIAGNOSIS — R600 Localized edema: Secondary | ICD-10-CM | POA: Insufficient documentation

## 2022-11-22 DIAGNOSIS — I10 Essential (primary) hypertension: Secondary | ICD-10-CM | POA: Diagnosis not present

## 2022-11-22 LAB — CBC WITH DIFFERENTIAL/PLATELET
Abs Immature Granulocytes: 0 10*3/uL (ref 0.00–0.07)
Basophils Absolute: 0 10*3/uL (ref 0.0–0.1)
Basophils Relative: 0 %
Eosinophils Absolute: 0 10*3/uL (ref 0.0–0.5)
Eosinophils Relative: 0 %
HCT: 34.6 % — ABNORMAL LOW (ref 36.0–46.0)
Hemoglobin: 11.5 g/dL — ABNORMAL LOW (ref 12.0–15.0)
Lymphocytes Relative: 29 %
Lymphs Abs: 3.7 10*3/uL (ref 0.7–4.0)
MCH: 35.5 pg — ABNORMAL HIGH (ref 26.0–34.0)
MCHC: 33.2 g/dL (ref 30.0–36.0)
MCV: 106.8 fL — ABNORMAL HIGH (ref 80.0–100.0)
Monocytes Absolute: 0.6 10*3/uL (ref 0.1–1.0)
Monocytes Relative: 5 %
Neutro Abs: 8.3 10*3/uL — ABNORMAL HIGH (ref 1.7–7.7)
Neutrophils Relative %: 66 %
Platelets: 189 10*3/uL (ref 150–400)
RBC: 3.24 MIL/uL — ABNORMAL LOW (ref 3.87–5.11)
RDW: 14.9 % (ref 11.5–15.5)
Smear Review: NORMAL
WBC: 12.6 10*3/uL — ABNORMAL HIGH (ref 4.0–10.5)
nRBC: 3 % — ABNORMAL HIGH (ref 0.0–0.2)
nRBC: 5 /100 WBC — ABNORMAL HIGH

## 2022-11-22 LAB — TROPONIN I (HIGH SENSITIVITY): Troponin I (High Sensitivity): 13 ng/L (ref <18)

## 2022-11-22 LAB — URINALYSIS, ROUTINE W REFLEX MICROSCOPIC
Bilirubin Urine: NEGATIVE
Glucose, UA: NEGATIVE mg/dL
Hgb urine dipstick: NEGATIVE
Ketones, ur: NEGATIVE mg/dL
Leukocytes,Ua: NEGATIVE
Nitrite: NEGATIVE
Protein, ur: NEGATIVE mg/dL
Specific Gravity, Urine: 1.021 (ref 1.005–1.030)
pH: 5 (ref 5.0–8.0)

## 2022-11-22 LAB — COMPREHENSIVE METABOLIC PANEL
ALT: 18 U/L (ref 0–44)
AST: 16 U/L (ref 15–41)
Albumin: 3.5 g/dL (ref 3.5–5.0)
Alkaline Phosphatase: 43 U/L (ref 38–126)
Anion gap: 7 (ref 5–15)
BUN: 21 mg/dL (ref 8–23)
CO2: 25 mmol/L (ref 22–32)
Calcium: 9.2 mg/dL (ref 8.9–10.3)
Chloride: 106 mmol/L (ref 98–111)
Creatinine, Ser: 1.26 mg/dL — ABNORMAL HIGH (ref 0.44–1.00)
GFR, Estimated: 48 mL/min — ABNORMAL LOW (ref >60)
Glucose, Bld: 107 mg/dL — ABNORMAL HIGH (ref 70–99)
Potassium: 4.9 mmol/L (ref 3.5–5.1)
Sodium: 138 mmol/L (ref 135–145)
Total Bilirubin: 0.6 mg/dL (ref 0.3–1.2)
Total Protein: 6.1 g/dL — ABNORMAL LOW (ref 6.5–8.1)

## 2022-11-22 LAB — T4, FREE: Free T4: 0.65 ng/dL (ref 0.61–1.12)

## 2022-11-22 LAB — MAGNESIUM: Magnesium: 2 mg/dL (ref 1.7–2.4)

## 2022-11-22 LAB — LIPASE, BLOOD: Lipase: 61 U/L — ABNORMAL HIGH (ref 11–51)

## 2022-11-22 LAB — TSH: TSH: 1.605 u[IU]/mL (ref 0.350–4.500)

## 2022-11-22 MED ORDER — POTASSIUM CHLORIDE CRYS ER 20 MEQ PO TBCR: 20.0000 meq | EXTENDED_RELEASE_TABLET | Freq: Every day | ORAL | 0 refills | Status: DC

## 2022-11-22 MED ORDER — FUROSEMIDE 40 MG PO TABS: 40.0000 mg | ORAL_TABLET | Freq: Every day | ORAL | 0 refills | Status: DC

## 2022-11-22 MED ORDER — FUROSEMIDE 40 MG PO TABS: 20.0000 mg | ORAL_TABLET | Freq: Once | ORAL | Status: DC

## 2022-11-22 MED ORDER — FUROSEMIDE 40 MG PO TABS
40.0000 mg | ORAL_TABLET | Freq: Once | ORAL | Status: DC
  Filled 2022-11-22: qty 1

## 2022-11-22 NOTE — ED Triage Notes (Signed)
Pt to triage via wheelchair.  Pt has kidney failure.  Pt reports swelling to legs and hands.  Pt reports burning sensation when standing.  Pt alert  speech clear

## 2022-11-22 NOTE — Discharge Instructions (Signed)
Your lab tests today were all okay.  Please resume taking Lasix once a day for the next week.  Take a potassium supplement with this as well.  Be sure to follow-up with your doctor or the heart failure clinic this week and arrange to have lab recheck in 1 week to ensure your kidney function remains okay.

## 2022-11-22 NOTE — ED Provider Notes (Signed)
[facility] Provider Note    Event Date/Time   First MD Initiated Contact with Patient 11/22/22 2108     (approximate)   History   Chief Complaint: Leg Swelling   HPI  Emma Gould is a 66 y.o. female with a history of hypertension, fibromyalgia, GERD who comes ED complaining of swelling in her face, hands, and bilateral legs.  She is also been having bilateral leg neuropathic pain, generalized weakness, recurrent episodes of diarrhea.  This is all been occurring over the past month.  She was hospitalized for a few days, and has followed up with her doctor since then.  She was trialed on 5 days of Lasix which was then discontinued.  Patient denies chest pain shortness of breath orthopnea or dyspnea on exertion.  Patient notes that she is on Coreg for blood pressure, but due to a misunderstanding she is only taking it once a day instead of twice daily as her doctor intended.  She was also prescribed HCTZ but has not been taking it.  Reviewed outside notes from her PCP Dr. Randel Pigg from November 12, 2022 noting the plan to obtain a repeat CT scan of the abdomen as well as a C. difficile toxin screen.  Reviewed outside labs in Care Everywhere which are unremarkable most recently.     Physical Exam   Triage Vital Signs: ED Triage Vitals  Enc Vitals Group     BP 11/22/22 1826 (!) 163/83     Pulse Rate 11/22/22 1826 92     Resp 11/22/22 1826 20     Temp 11/22/22 1826 98.9 F (37.2 C)     Temp Source 11/22/22 1826 Oral     SpO2 11/22/22 1826 100 %     Weight 11/22/22 1825 145 lb 8.1 oz (66 kg)     Height 11/22/22 1825 5\' 3"  (1.6 m)     Head Circumference --      Peak Flow --      Pain Score 11/22/22 1824 5     Pain Loc --      Pain Edu? --      Excl. in GC? --     Most recent vital signs: Vitals:   11/22/22 1826 11/22/22 2217  BP: (!) 163/83 139/65  Pulse: 92 75  Resp: 20 20  Temp: 98.9 F (37.2 C) 99 F (37.2 C)  SpO2: 100% 99%     General: Awake, no distress.  CV:  Good peripheral perfusion.  Regular rate and rhythm Resp:  Normal effort.  Good auscultation bilaterally Abd:  No distention.  Soft and nontender Other:  1+ pitting edema bilateral lower extremities, symmetric.  No calf tenderness.  No appreciable edema in the abdominal wall, upper extremities.  Thyroid non palpable.   ED Results / Procedures / Treatments   Labs (all labs ordered are listed, but only abnormal results are displayed) Labs Reviewed  CBC WITH DIFFERENTIAL/PLATELET - Abnormal; Notable for the following components:      Result Value   WBC 12.6 (*)    RBC 3.24 (*)    Hemoglobin 11.5 (*)    HCT 34.6 (*)    MCV 106.8 (*)    MCH 35.5 (*)    nRBC 3.0 (*)    Neutro Abs 8.3 (*)    nRBC 5 (*)    All other components within normal limits  COMPREHENSIVE METABOLIC PANEL - Abnormal; Notable for the following components:   Glucose, Bld 107 (*)  Creatinine, Ser 1.26 (*)    Total Protein 6.1 (*)    GFR, Estimated 48 (*)    All other components within normal limits  LIPASE, BLOOD - Abnormal; Notable for the following components:   Lipase 61 (*)    All other components within normal limits  TSH  T4, FREE  MAGNESIUM  PATHOLOGIST SMEAR REVIEW  URINALYSIS, ROUTINE W REFLEX MICROSCOPIC  TROPONIN I (HIGH SENSITIVITY)     EKG Interpreted by me Normal sinus rhythm rate of 84.  Normal axis, intervals, QRS ST segments and T waves.   RADIOLOGY Chest x-ray interpreted by me, negative for edema effusion consolidation or other acute findings.  Radiology report reviewed.   PROCEDURES:  Procedures   MEDICATIONS ORDERED IN ED: Medications  furosemide (LASIX) tablet 40 mg (has no administration in time range)     IMPRESSION / MDM / ASSESSMENT AND PLAN / ED COURSE  I reviewed the triage vital signs and the nursing notes.                              Differential diagnosis includes, but is not limited to, anemia, hypoalbuminemia,  hypothyroidism, electrolyte abnormality, AKI, UTI, urinary retention, venous insufficiency  Patient's presentation is most consistent with acute presentation with potential threat to life or bodily function.  Patient presents with peripheral edema which is somewhat chronic issue, possibly worsened due to not taking Coreg and HCTZ as intended.  She is nontoxic, not having symptoms of ACS or acute heart failure.  Doubt PE dissection or sepsis.  Lab panel is overall unremarkable.  Electrolytes, thyroid function are normal.  Creatinine unremarkable.  We will give the patient p.o. Lasix, obtain urinalysis and check a bladder scan.  Plan for outpatient follow-up with PCP as well as heart failure clinic to consider outpatient echocardiogram.  ----------------------------------------- 10:28 PM on 11/22/2022 ----------------------------------------- Bladder scan 28 mL.  Patient refusing Lasix and urinalysis.  She does not require admission currently with normal vitals, nontoxic, no increased work of breathing.  Stable for discharge.       FINAL CLINICAL IMPRESSION(S) / ED DIAGNOSES   Final diagnoses:  Peripheral edema     Rx / DC Orders   ED Discharge Orders          Ordered    furosemide (LASIX) 40 MG tablet  Daily        11/22/22 2218    potassium chloride SA (KLOR-CON M) 20 MEQ tablet  Daily        11/22/22 2218    AMB referral to CHF clinic        11/22/22 2219             Note:  This document was prepared using Dragon voice recognition software and may include unintentional dictation errors.   Carrie Mew, MD 11/22/22 2228

## 2022-11-22 NOTE — ED Provider Triage Note (Signed)
Emergency Medicine Provider Triage Evaluation Note  Emma Gould , a 66 y.o. female  was evaluated in triage.  Patient has a history of hypertension, fibromyalgia and kidney failure presenting to the emergency department with concern for fluid overload.  Patient is also complaining of some central chest pain.  No chest tightness or shortness of breath.  No nausea, vomiting or abdominal pain.  Review of Systems  Positive: Patient has lower extremity edema.  Negative: No chest pain, chest tightness or abdominal pain.   Physical Exam  BP (!) 163/83 (BP Location: Left Arm)   Pulse 92   Temp 98.9 F (37.2 C) (Oral)   Resp 20   Ht 5\' 3"  (1.6 m)   Wt 66 kg   SpO2 100%   BMI 25.77 kg/m  Gen:   Awake, no distress   Resp:  Normal effort  MSK:   Moves extremities without difficulty  Other:    Medical Decision Making  Medically screening exam initiated at 6:29 PM.  Appropriate orders placed.  Lovette Merta was informed that the remainder of the evaluation will be completed by another provider, this initial triage assessment does not replace that evaluation, and the importance of remaining in the ED until their evaluation is complete.     Rondel Baton [place], [place] 11/22/22 1830

## 2022-11-22 NOTE — ED Notes (Signed)
Bladder scan performed: 28 ml. Patient is refusing to take lasix.

## 2022-11-23 LAB — PATHOLOGIST SMEAR REVIEW

## 2022-11-23 NOTE — Telephone Encounter (Signed)
LVM in attempt to schedule patient for a new patient CHF Clinic appointment after being recently seen in the ER.   Emma Gould, NT

## 2022-11-26 ENCOUNTER — Ambulatory Visit: Payer: Medicare Other | Attending: Family | Admitting: Family

## 2022-11-26 ENCOUNTER — Encounter: Payer: Self-pay | Admitting: Family

## 2022-11-26 ENCOUNTER — Telehealth: Payer: Self-pay | Admitting: Family

## 2022-11-26 VITALS — BP 149/78 | HR 82 | Resp 20 | Wt 149.0 lb

## 2022-11-26 DIAGNOSIS — M797 Fibromyalgia: Secondary | ICD-10-CM | POA: Insufficient documentation

## 2022-11-26 DIAGNOSIS — I110 Hypertensive heart disease with heart failure: Secondary | ICD-10-CM | POA: Insufficient documentation

## 2022-11-26 DIAGNOSIS — I5033 Acute on chronic diastolic (congestive) heart failure: Secondary | ICD-10-CM | POA: Diagnosis not present

## 2022-11-26 DIAGNOSIS — F32A Depression, unspecified: Secondary | ICD-10-CM | POA: Diagnosis not present

## 2022-11-26 DIAGNOSIS — F329 Major depressive disorder, single episode, unspecified: Secondary | ICD-10-CM | POA: Diagnosis not present

## 2022-11-26 DIAGNOSIS — Z720 Tobacco use: Secondary | ICD-10-CM

## 2022-11-26 DIAGNOSIS — F17210 Nicotine dependence, cigarettes, uncomplicated: Secondary | ICD-10-CM | POA: Insufficient documentation

## 2022-11-26 DIAGNOSIS — R220 Localized swelling, mass and lump, head: Secondary | ICD-10-CM | POA: Diagnosis not present

## 2022-11-26 DIAGNOSIS — F419 Anxiety disorder, unspecified: Secondary | ICD-10-CM | POA: Insufficient documentation

## 2022-11-26 DIAGNOSIS — I5032 Chronic diastolic (congestive) heart failure: Secondary | ICD-10-CM

## 2022-11-26 DIAGNOSIS — I10 Essential (primary) hypertension: Secondary | ICD-10-CM

## 2022-11-26 LAB — BASIC METABOLIC PANEL
Anion gap: 9 (ref 5–15)
BUN: 16 mg/dL (ref 8–23)
CO2: 26 mmol/L (ref 22–32)
Calcium: 8.9 mg/dL (ref 8.9–10.3)
Chloride: 102 mmol/L (ref 98–111)
Creatinine, Ser: 1.49 mg/dL — ABNORMAL HIGH (ref 0.44–1.00)
GFR, Estimated: 39 mL/min — ABNORMAL LOW (ref >60)
Glucose, Bld: 103 mg/dL — ABNORMAL HIGH (ref 70–99)
Potassium: 3.4 mmol/L — ABNORMAL LOW (ref 3.5–5.1)
Sodium: 137 mmol/L (ref 135–145)

## 2022-11-26 MED ORDER — POTASSIUM CHLORIDE CRYS ER 20 MEQ PO TBCR: 40.0000 meq | EXTENDED_RELEASE_TABLET | Freq: Every day | ORAL | 5 refills | Status: DC

## 2022-11-26 NOTE — Telephone Encounter (Signed)
Called and LM regarding BMP results obtained earlier today. Potassium is slightly low so LM for patient to take 2 more potassium tablets today and then beginning tomorrow, she will take potassium daily.  Will recheck labs next week.

## 2022-11-26 NOTE — Patient Instructions (Addendum)
Begin weighing daily and call for an overnight weight gain of 3 pounds or more or a weekly weight gain of more than 5 pounds.   If you have voicemail, please make sure your mailbox is cleaned out so that we may leave a message and please make sure to listen to any voicemails.   Get compression socks and put them on daily with removal at bedtime. Elevate legs when sitting for long periods of time.    Your provider has order Furoscix for you. This is an on-body infuser that gives you a dose of Furosemide.   For questions regarding the device call Furoscix Direct at [phone]  Ensure you write down the time you start your infusion so that if there is a problem you will know how long the infusion lasted  Use Furoscix only AS DIRECTED by our office  Dosing Directions:   Day 1= tomorrow. Use the furoscix device. Do NOT take your lasix pill tomorrow but take all your other medicine including your potassium

## 2022-11-26 NOTE — Progress Notes (Unsigned)
   Patient ID: Emma Gould, female    DOB: [birth year], 66 y.o.   MRN: [id number]  HPI  Emma Gould is a 66 y/o female with a history of HTN, anxiety, depression, fibromyalgia, tobacco use and chronic heart failure.   No echo for review.   Was in the ED 11/22/22 due to swelling in her face, hands, and bilateral legs. She is also been having bilateral leg neuropathic pain, generalized weakness, recurrent episodes of diarrhea. Hadn't been taking her coreg correctly or her HCTZ at all. Refused oral lasix. She was released. Admitted 10/30/22 due to generalized weakness. She states that she has been having left-sided> right-sided abdominal pain for at least the last 2 weeks. Started on ceftriaxone and metronidazole and IV fluids. GI consult obtained. CT abd-moderate diffuse colonic wall thickening in the transverse, descending, sigmoid colon with pericolonic stranding.  Lokelma given for hyperkalemia. Discharged after 2 days.   She presents today for her initial visit with a chief complaint of  Review of Systems  Constitutional:  Positive for fatigue (easily).  Eyes: Negative.   Respiratory:  Negative for chest tightness and shortness of breath.   Cardiovascular:  Positive for leg swelling. Negative for chest pain and palpitations.  Gastrointestinal:  Positive for abdominal distention. Negative for abdominal pain.  Endocrine: Negative.   Neurological:  Positive for light-headedness.  Psychiatric/Behavioral:  Negative for sleep disturbance (sleeping on 1 pillow).     Vitals:   11/26/22 1359  BP: (!) 149/78  Pulse: 82  Resp: 20  SpO2: 98%  Weight: 149 lb (67.6 kg)   Wt Readings from Last 3 Encounters:  11/26/22 149 lb (67.6 kg)  11/22/22 145 lb 8.1 oz (66 kg)  10/30/22 147 lb 0.8 oz (66.7 kg)   Lab Results  Component Value Date   CREATININE 1.26 (H) 11/22/2022   CREATININE 1.55 (H) 11/01/2022   CREATININE 2.03 (H) 10/31/2022   Physical Exam    Assessment & Plan:  1: Chronic heart  failure with preserved ejection fraction- - NYHA class - have scheduled echo on - BNP 09/18/19 was 83.5  2: HTN- - BP mildly elevated (149/78) - saw PCP Randel Pigg) 11/12/22 - BMP 11/22/22 reviewed and showed sodium 138, potassium 4.9, creatinine 1.26 & GFR 48  3: Depression/ anxiety- - had telemedicine visit with psychiatry ([place]) 11/24/22  4: Tobacco use- - smoking

## 2022-11-27 ENCOUNTER — Encounter: Payer: Self-pay | Admitting: Family

## 2022-11-28 NOTE — Progress Notes (Signed)
Patient ID: Emma Gould, female    DOB: 1956-01-11, 66 y.o.   MRN: [id number]  HPI  Emma Gould is a 66 y/o female with a history of HTN, anxiety, depression, fibromyalgia, tobacco use and chronic heart failure.   No echo for review.   Was in the ED 11/22/22 due to swelling in her face, hands, and bilateral legs. She is also been having bilateral leg neuropathic pain, generalized weakness, recurrent episodes of diarrhea. Hadn't been taking her coreg correctly or her HCTZ at all. Refused oral lasix. She was released. Admitted 10/30/22 due to generalized weakness. She states that she has been having left-sided> right-sided abdominal pain for at least the last 2 weeks. Started on ceftriaxone and metronidazole and IV fluids. GI consult obtained. CT abd-moderate diffuse colonic wall thickening in the transverse, descending, sigmoid colon with pericolonic stranding.  Lokelma given for hyperkalemia. Discharged after 2 days.   She presents today for a f/u visit with a chief complaint of moderate fatigue with minimal exertion. Describes this as chronic in nature. She has associated pedal edema (improving), abdominal distention (improving), dizziness, headaches and leg pain along with this. She denies any difficulty sleeping, palpitations, chest pain, wheezing, shortness of breath or cough.   She is getting scales today so that she can start weighing daily. She used furoscix with additional potassium pills 2 days ago with increased urination during the day and she reports feeling better with less facial puffiness.   Past Medical History:  Diagnosis Date   Anxiety    CHF (congestive heart failure) (HCC)    Depression    Fibromyalgia    Hypertension    Past Surgical History:  Procedure Laterality Date   CERVICAL FUSION     Family History  Problem Relation Age of Onset   Stroke Mother    Hypertension Mother    Lung cancer Father        Father died of lung cancer   Social History   Tobacco Use    Smoking status: Every Day   Smokeless tobacco: Never  Substance Use Topics   Alcohol use: Never   No Known Allergies  Prior to Admission medications   Medication Sig Start Date End Date Taking? Authorizing Provider  acetaminophen (TYLENOL) 325 MG tablet Take 2 tablets (650 mg total) by mouth every 6 (six) hours as needed for mild pain or headache (fever >/= 101). 09/05/19  Yes Emma Blood, MD  albuterol (VENTOLIN HFA) 108 (90 Base) MCG/ACT inhaler Inhale 2 puffs into the lungs 4 (four) times daily as needed for shortness of breath.   Yes [provider]  carvedilol (COREG) 25 MG tablet Take 25 mg by mouth daily.   Yes [provider]  DULoxetine (CYMBALTA) 60 MG capsule Take 60 mg by mouth 2 (two) times daily.   Yes [provider]  Eszopiclone 3 MG TABS Take 3 mg by mouth at bedtime. 10/07/22  Yes [provider]  ezetimibe (ZETIA) 10 MG tablet Take 10 mg by mouth daily.   Yes [provider]  furosemide (LASIX) 40 MG tablet Take 1 tablet (40 mg total) by mouth daily for 7 days. 11/22/22 11/29/22 Yes Sharman Cheek, MD  omeprazole (PRILOSEC) 40 MG capsule Take 40 mg by mouth daily. 06/11/19  Yes [provider]  oxyCODONE-acetaminophen (PERCOCET) 10-325 MG tablet Take 1 tablet by mouth 4 (four) times daily as needed for pain. 10/03/22  Yes [provider]  potassium chloride SA (KLOR-CON M) 20 MEQ  tablet Take 2 tablets (40 mEq total) by mouth daily. 11/26/22  Yes Wallis Spizzirri, Inetta Fermo A, FNP  QUEtiapine (SEROQUEL) 25 MG tablet Take 1 tablet (25 mg total) by mouth at bedtime. Patient taking differently: Take 100 mg by mouth at bedtime. 09/05/19  Yes Emma Blood, MD  SUMAtriptan (IMITREX) 50 MG tablet Take 50 mg by mouth as needed for migraine. Take a second tablet 2 hours later if needed; max 200mg /day 12/21/18  Yes [provider]  topiramate (TOPAMAX) 50 MG tablet Take 50 mg by mouth daily as needed (migraine). 10/10/22   Yes [provider]    Review of Systems  Constitutional:  Positive for fatigue (easily). Negative for appetite change.  HENT:  Negative for congestion, postnasal drip and sore throat.   Eyes: Negative.   Respiratory:  Negative for cough, chest tightness, shortness of breath and wheezing.   Cardiovascular:  Positive for leg swelling (improving). Negative for chest pain and palpitations.  Gastrointestinal:  Positive for abdominal distention (improving). Negative for abdominal pain.  Endocrine: Negative.   Genitourinary: Negative.   Musculoskeletal:  Positive for arthralgias (legs). Negative for back pain.  Skin: Negative.   Allergic/Immunologic: Negative.   Neurological:  Positive for dizziness, light-headedness and headaches.  Hematological:  Negative for adenopathy. Does not bruise/bleed easily.  Psychiatric/Behavioral:  Negative for dysphoric mood and sleep disturbance (sleeping on 1 pillow). The patient is not nervous/anxious.    Vitals:   11/29/22 0922  BP: (!) 141/83  Pulse: 71  Resp: 18  SpO2: 99%  Weight: 153 lb (69.4 kg)   Wt Readings from Last 3 Encounters:  11/29/22 153 lb (69.4 kg)  11/26/22 149 lb (67.6 kg)  11/22/22 145 lb 8.1 oz (66 kg)   Lab Results  Component Value Date   CREATININE 1.48 (H) 11/29/2022   CREATININE 1.49 (H) 11/26/2022   CREATININE 1.26 (H) 11/22/2022   Physical Exam Vitals and nursing note reviewed. Exam conducted with a chaperone present (friend).  Constitutional:      Appearance: Normal appearance.     Comments: Face looks puffy although less so  HENT:     Head: Normocephalic and atraumatic.  Cardiovascular:     Rate and Rhythm: Normal rate and regular rhythm.  Pulmonary:     Effort: Pulmonary effort is normal. No respiratory distress.     Breath sounds: No wheezing or rales.  Abdominal:     General: There is no distension.     Palpations: Abdomen is soft.  Musculoskeletal:        General: No tenderness.     Cervical  back: Normal range of motion and neck supple.     Right lower leg: Edema (1+ pitting) present.     Left lower leg: Edema (1+ pitting) present.  Skin:    General: Skin is warm and dry.  Neurological:     General: No focal deficit present.     Mental Status: She is alert and oriented to person, place, and time.  Psychiatric:        Mood and Affect: Mood normal.        Behavior: Behavior normal.        Thought Content: Thought content normal.    Assessment & Plan:  1: Chronic heart failure with probable preserved ejection fraction- - NYHA class III - still with excess fluid but improved from last visit symptom wise and PE wise even though weight is up - getting scales today and she was instructed to start weighing  daily and call for an overnight weight gain of > 2 pounds or a weekly weight gain of > 5 pounds - weight up 4 pounds from last visit here 3 days ago - check BMP today since a dose of furoscix was used and potassium dose increased; furoscix forms faxed in after last visit here - not adding salt to her food and have been reading food labels for sodium content. reviewed keeping daily sodium intake to 2000mg  - trying to keep daily fluid intake to 60 ounces - will begin jardiance 10mg  daily - voucher and 2 weeks samples provided - check labs next visit - now wearing compression socks with improvement of edema - echo currently scheduled on 12/10/22 - refer to Dr. Gala Romney for further evaluation; she was agreeable to this - ReDs clip reading today was 28% - BNP 09/18/19 was 83.5  2: HTN- - BP mildly elevated (141/83) - saw PCP Randel Pigg) 11/12/22 - BMP 11/26/22 reviewed and showed sodium 137, potassium 3.4, creatinine 1.49 & GFR 39  3: Depression/ anxiety- - had telemedicine visit with psychiatry ([place]) 11/24/22  4: Tobacco use- - smoking 1 ppd - cessation discussed for 3 minutes   Medication list reviewed.   Return in 2 weeks, sooner if needed.

## 2022-11-29 ENCOUNTER — Ambulatory Visit: Payer: Medicare Other | Attending: Family | Admitting: Family

## 2022-11-29 ENCOUNTER — Encounter: Payer: Self-pay | Admitting: Family

## 2022-11-29 ENCOUNTER — Other Ambulatory Visit
Admission: RE | Admit: 2022-11-29 | Discharge: 2022-11-29 | Disposition: A | Discharge status: Home / Self Care | Payer: Medicare Other | Source: Ambulatory Visit | Attending: Family | Admitting: Family

## 2022-11-29 VITALS — BP 141/83 | HR 71 | Resp 18 | Wt 153.0 lb

## 2022-11-29 DIAGNOSIS — I5032 Chronic diastolic (congestive) heart failure: Secondary | ICD-10-CM | POA: Insufficient documentation

## 2022-11-29 DIAGNOSIS — F419 Anxiety disorder, unspecified: Secondary | ICD-10-CM | POA: Insufficient documentation

## 2022-11-29 DIAGNOSIS — F32A Depression, unspecified: Secondary | ICD-10-CM | POA: Insufficient documentation

## 2022-11-29 DIAGNOSIS — F329 Major depressive disorder, single episode, unspecified: Secondary | ICD-10-CM

## 2022-11-29 DIAGNOSIS — I10 Essential (primary) hypertension: Secondary | ICD-10-CM | POA: Diagnosis not present

## 2022-11-29 DIAGNOSIS — I110 Hypertensive heart disease with heart failure: Secondary | ICD-10-CM | POA: Insufficient documentation

## 2022-11-29 DIAGNOSIS — F17210 Nicotine dependence, cigarettes, uncomplicated: Secondary | ICD-10-CM | POA: Insufficient documentation

## 2022-11-29 DIAGNOSIS — Z720 Tobacco use: Secondary | ICD-10-CM | POA: Diagnosis not present

## 2022-11-29 DIAGNOSIS — I509 Heart failure, unspecified: Secondary | ICD-10-CM | POA: Diagnosis present

## 2022-11-29 LAB — BASIC METABOLIC PANEL
Anion gap: 9 (ref 5–15)
BUN: 21 mg/dL (ref 8–23)
CO2: 30 mmol/L (ref 22–32)
Calcium: 8.7 mg/dL — ABNORMAL LOW (ref 8.9–10.3)
Chloride: 99 mmol/L (ref 98–111)
Creatinine, Ser: 1.48 mg/dL — ABNORMAL HIGH (ref 0.44–1.00)
GFR, Estimated: 39 mL/min — ABNORMAL LOW (ref >60)
Glucose, Bld: 88 mg/dL (ref 70–99)
Potassium: 2.9 mmol/L — ABNORMAL LOW (ref 3.5–5.1)
Sodium: 138 mmol/L (ref 135–145)

## 2022-11-29 MED ORDER — SPIRONOLACTONE 25 MG PO TABS: 12.5000 mg | ORAL_TABLET | Freq: Every day | ORAL | 3 refills | Status: DC

## 2022-11-29 MED ORDER — EMPAGLIFLOZIN 10 MG PO TABS: 10.0000 mg | ORAL_TABLET | Freq: Every day | ORAL | 0 refills | Status: DC

## 2022-11-29 NOTE — Patient Instructions (Addendum)
Continue weighing daily and call for an overnight weight gain of 3 pounds or more or a weekly weight gain of more than 5 pounds.   If you have voicemail, please make sure your mailbox is cleaned out so that we may leave a message and please make sure to listen to any voicemails.    Begin taking jardiance as 1 tablet every morning.     

## 2022-11-29 NOTE — Progress Notes (Signed)
ReDS Vest / Clip - 11/29/22 0086       ReDS Vest / Clip   Station Marker A    ReDS Actual Value 28

## 2022-11-30 ENCOUNTER — Other Ambulatory Visit: Payer: Self-pay | Admitting: Family

## 2022-11-30 DIAGNOSIS — I5032 Chronic diastolic (congestive) heart failure: Secondary | ICD-10-CM

## 2022-11-30 MED ORDER — POTASSIUM CHLORIDE CRYS ER 20 MEQ PO TBCR: 40.0000 meq | EXTENDED_RELEASE_TABLET | Freq: Every day | ORAL | 5 refills | Status: DC

## 2022-12-09 ENCOUNTER — Ambulatory Visit: Payer: Medicare Other | Admitting: Gastroenterology

## 2022-12-10 ENCOUNTER — Encounter: Payer: Self-pay | Admitting: Family

## 2022-12-10 ENCOUNTER — Ambulatory Visit
Admission: RE | Admit: 2022-12-10 | Discharge: 2022-12-10 | Disposition: A | Discharge status: Home / Self Care | Payer: Medicare Other | Source: Ambulatory Visit | Attending: Family | Admitting: Family

## 2022-12-10 ENCOUNTER — Encounter
Admission: RE | Admit: 2022-12-10 | Discharge: 2022-12-10 | Disposition: A | Discharge status: Home / Self Care | Payer: Medicare Other | Source: Ambulatory Visit | Attending: Family | Admitting: Family

## 2022-12-10 ENCOUNTER — Other Ambulatory Visit: Payer: Medicare Other

## 2022-12-10 ENCOUNTER — Other Ambulatory Visit (HOSPITAL_COMMUNITY): Payer: Self-pay

## 2022-12-10 DIAGNOSIS — I110 Hypertensive heart disease with heart failure: Secondary | ICD-10-CM | POA: Insufficient documentation

## 2022-12-10 DIAGNOSIS — I083 Combined rheumatic disorders of mitral, aortic and tricuspid valves: Secondary | ICD-10-CM | POA: Diagnosis not present

## 2022-12-10 DIAGNOSIS — I5033 Acute on chronic diastolic (congestive) heart failure: Secondary | ICD-10-CM | POA: Diagnosis not present

## 2022-12-10 DIAGNOSIS — I5032 Chronic diastolic (congestive) heart failure: Secondary | ICD-10-CM

## 2022-12-10 DIAGNOSIS — I509 Heart failure, unspecified: Secondary | ICD-10-CM | POA: Diagnosis present

## 2022-12-10 LAB — BASIC METABOLIC PANEL
Anion gap: 6 (ref 5–15)
BUN: 19 mg/dL (ref 8–23)
CO2: 24 mmol/L (ref 22–32)
Calcium: 8.8 mg/dL — ABNORMAL LOW (ref 8.9–10.3)
Chloride: 108 mmol/L (ref 98–111)
Creatinine, Ser: 1.3 mg/dL — ABNORMAL HIGH (ref 0.44–1.00)
GFR, Estimated: 46 mL/min — ABNORMAL LOW (ref >60)
Glucose, Bld: 90 mg/dL (ref 70–99)
Potassium: 4.4 mmol/L (ref 3.5–5.1)
Sodium: 138 mmol/L (ref 135–145)

## 2022-12-10 LAB — ECHOCARDIOGRAM COMPLETE
AR max vel: 2.39 cm2
AV Area VTI: 2.53 cm2
AV Area mean vel: 2.56 cm2
AV Mean grad: 3 mmHg
AV Peak grad: 5.9 mmHg
Ao pk vel: 1.21 m/s
Area-P 1/2: 4.74 cm2
S' Lateral: 3 cm

## 2022-12-10 NOTE — Progress Notes (Signed)
*  PRELIMINARY RESULTS* Echocardiogram 2D Echocardiogram has been performed.  Emma Gould 12/10/2022, 11:36 AM

## 2022-12-13 ENCOUNTER — Ambulatory Visit: Payer: Medicare Other | Attending: Internal Medicine | Admitting: Internal Medicine

## 2022-12-13 ENCOUNTER — Encounter: Payer: Self-pay | Admitting: Internal Medicine

## 2022-12-13 VITALS — BP 140/80 | HR 69 | Ht 63.0 in | Wt 157.1 lb

## 2022-12-13 DIAGNOSIS — R600 Localized edema: Secondary | ICD-10-CM | POA: Diagnosis not present

## 2022-12-13 DIAGNOSIS — I5032 Chronic diastolic (congestive) heart failure: Secondary | ICD-10-CM

## 2022-12-13 DIAGNOSIS — Z720 Tobacco use: Secondary | ICD-10-CM

## 2022-12-13 MED ORDER — TORSEMIDE 20 MG PO TABS: 20.0000 mg | ORAL_TABLET | Freq: Every day | ORAL | 3 refills | Status: DC

## 2022-12-13 NOTE — Patient Instructions (Signed)
Medication Changes:  Begin taking torsemide 20 mg daily Begin wearing compression hose daily  Lab Work: Please return to [facility] lab in 2 weeks for blood work.   Testing/Procedures:  None  Referrals:  None  Special Instructions // Education:  Do the following things EVERYDAY: Weigh yourself in the morning before breakfast. Write it down and keep it in a log. Take your medicines as prescribed Eat low salt foods--Limit salt (sodium) to 2000 mg per day.  Stay as active as you can everyday Limit all fluids for the day to less than 2 liters   Follow-Up in: 1 month with Dr. Gala Romney, January 25, 2023 at 10 AM    If you have any questions or concerns before your next appointment please send [place] a message through [place] or call our office at [phone] Monday-Friday 8 am-5 pm.   If you have an urgent need after hours on the weekend please call your Primary Cardiologist or the [facility] in [place] at [phone].

## 2022-12-13 NOTE — Progress Notes (Signed)
ADV[facility]ED [facility] CLINIC CONSULT NOTE  Referring Physician: Clarisa Kindred, NP Primary Care:Silva Alinda Dooms, MD Primary Cardiologist: None   HPI:  Ms Glantz is a 66 y/o female with HTN, anxiety, depression, fibromyalgia, tobacco use and chronic diastolic heart failure referred by Clarisa Kindred for further evalaution of her HF, .    Seen in ED 11/22/22 due to swelling in her face, hands, and bilateral legs. Hadn't been taking her coreg correctly or her HCTZ at all. Refused oral lasix. She was released. Admitted 10/30/22 due to generalized weakness. She states that she has been having left-sided> right-sided abdominal pain for at least the last 2 weeks. Started on ceftriaxone and metronidazole and IV fluids. GI consult obtained. CT abd-moderate diffuse colonic wall thickening in the transverse, descending, sigmoid colon with pericolonic stranding.  Lokelma given for hyperkalemia. Discharged after 2 days.    Seen by Ms. Hackney in HF Clinic for first visit on 11/26/22. Had 2+ edema. Furoscix prescribed and echo ordered. Had good response to Furoscix initially but then stopped working. Now with recurrent LE edema. Says it is hard to walk. + exertional dyspnea. No CP. Still smoking almost 1ppd   Echo 12/10/22 EF 60-65% Normal diastolic function. RV normal. Mild to moderate TR RVSP   Review of Systems: [y] = yes, [ ]  = no   General: Weight gain [ ] ; Weight loss [ ] ; Anorexia [ ] ; Fatigue [ y]; Fever [ ] ; Chills [ ] ; Weakness [ y]  Cardiac: Chest pain/pressure [ ] ; Resting SOB [ ] ; Exertional SOB ]; Orthopnea [ ] ; Pedal Edema [ y]; Palpitations [ ] ; Syncope [ ] ; Presyncope [ ] ; Paroxysmal nocturnal dyspnea[ ]   Pulmonary: Cough [ y]; Wheezing[ ] ; Hemoptysis[ ] ; Sputum [ ] ; Snoring [ ]   GI: Vomiting[ ] ; Dysphagia[ ] ; Melena[ ] ; Hematochezia [ ] ; Heartburn[ ] ; Abdominal pain [ ] ; Constipation [ ] ; Diarrhea [ ] ; BRBPR [ ]   GU: Hematuria[ ] ; Dysuria [ ] ; Nocturia[ ]   Vascular: Pain in legs with  walking [ ] ; Pain in feet with lying flat [ ] ; Non-healing sores [ ] ; Stroke [ ] ; TIA [ ] ; Slurred speech [ ] ;  Neuro: Headaches[ ] ; Vertigo[ ] ; Seizures[ ] ; Paresthesias[ ] ;Blurred vision [ ] ; Diplopia [ ] ; Vision changes [ ]   Ortho/Skin: Arthritis ]; Joint pain ]; Muscle pain [ y]; Joint swelling [ ] ; Back Pain ]; Rash [ ]   Psych: Depression[y ]; Anxiety[y ]  Heme: Bleeding problems [ ] ; Clotting disorders [ ] ; Anemia [ ]   Endocrine: Diabetes [ ] ; Thyroid dysfunction[ ]    Past Medical History:  Diagnosis Date   Anxiety    CHF (congestive heart failure) (HCC)    Depression    Fibromyalgia    Hypertension     Current Outpatient Medications  Medication Sig Dispense Refill   acetaminophen (TYLENOL) 325 MG tablet Take 2 tablets (650 mg total) by mouth every 6 (six) hours as needed for mild pain or headache (fever >/= 101).     albuterol (VENTOLIN HFA) 108 (90 Base) MCG/ACT inhaler Inhale 2 puffs into the lungs 4 (four) times daily as needed for shortness of breath.     carvedilol (COREG) 25 MG tablet Take 25 mg by mouth 2 (two) times daily with a meal.     DULoxetine (CYMBALTA) 60 MG capsule Take 60 mg by mouth 2 (two) times daily.     empagliflozin (JARDIANCE) 10 MG TABS tablet Take 1 tablet (10 mg total) by mouth daily  before breakfast. 14 tablet 0   Eszopiclone 3 MG TABS Take 3 mg by mouth at bedtime.     ezetimibe (ZETIA) 10 MG tablet Take 10 mg by mouth daily.     magnesium oxide (MAG-OX) 400 MG tablet Take 400 mg by mouth daily.     omeprazole (PRILOSEC) 40 MG capsule Take 40 mg by mouth daily.     oxyCODONE-acetaminophen (PERCOCET) 10-325 MG tablet Take 1 tablet by mouth 4 (four) times daily as needed for pain.     potassium chloride SA (KLOR-CON M) 20 MEQ tablet Take 2 tablets (40 mEq total) by mouth daily. And extra doses as needed 70 tablet 5   QUEtiapine (SEROQUEL) 25 MG tablet Take 1 tablet (25 mg total) by mouth at bedtime. (Patient taking differently: Take 100 mg by  mouth at bedtime.) 30 tablet 0   spironolactone (ALDACTONE) 25 MG tablet Take 0.5 tablets (12.5 mg total) by mouth daily. 15 tablet 3   SUMAtriptan (IMITREX) 50 MG tablet Take 50 mg by mouth as needed for migraine. Take a second tablet 2 hours later if needed; max 200mg /day     topiramate (TOPAMAX) 50 MG tablet Take 50 mg by mouth daily as needed (migraine).     torsemide (DEMADEX) 20 MG tablet Take 1 tablet (20 mg total) by mouth daily. 30 tablet 3   Vitamin D, Ergocalciferol, (DRISDOL) 1.25 MG (50000 UNIT) CAPS capsule Take 50,000 Units by mouth once a week.     No current facility-administered medications for this visit.    No Known Allergies    Social History   Socioeconomic History   Marital status: Widowed    Spouse name: Not on file   Number of children: Not on file   Years of education: Not on file   Highest education level: Not on file  Occupational History   Not on file  Tobacco Use   Smoking status: Every Day   Smokeless tobacco: Never  Substance and Sexual Activity   Alcohol use: Never   Drug use: Never   Sexual activity: Not on file  Other Topics Concern   Not on file  Social History Narrative   Not on file   Social Determinants of Health   Financial Resource Strain: Not on file  Food Insecurity: No Food Insecurity (10/30/2022)   Hunger Vital Sign    Worried About Running Out of Food in the Last Year: Never true    Ran Out of Food in the Last Year: Never true  Transportation Needs: No Transportation Needs (10/30/2022)   PRAPARE - 13/03/2022 (Medical): No    Lack of Transportation (Non-Medical): No  Physical Activity: Not on file  Stress: Not on file  Social Connections: Not on file  Intimate Partner Violence: Not At Risk (10/30/2022)   Humiliation, Afraid, Rape, and Kick questionnaire    Fear of Current or Ex-Partner: No    Emotionally Abused: No    Physically Abused: No    Sexually Abused: No      Family History   Problem Relation Age of Onset   Stroke Mother    Hypertension Mother    Lung cancer Father        Father died of lung cancer    Vitals:   12/13/22 1327  BP: (!) 140/80  Pulse: 69  SpO2: 100%  Weight: 157 lb 2 oz (71.3 kg)  Height: 5\' 3"  (1.6 m)    PHYSICAL EXAM: General:  No respiratory  difficulty HEENT: normal Neck: supple. no JVD. Carotids 2+ bilat; no bruits. No lymphadenopathy or thryomegaly appreciated. Cor: PMI nondisplaced. Regular rate & rhythm. No rubs, gallops or murmurs. Lungs: decreased throughout  Abdomen: obese  soft, nontender, nondistended. No hepatosplenomegaly. No bruits or masses. Good bowel sounds. Extremities: no cyanosis, clubbing, rash, 2-3+ edema knees down Neuro: alert & oriented x 3, cranial nerves grossly intact. moves all 4 extremities w/o difficulty. Affect pleasant.   ASSESSMENT & PLAN:  1. Lower extremity edema, possible diastolic HF - Echo 12/10/22 EF 60-65% Normal diastolic function. RV normal. Mild to moderate TR RVSP - Given echo results and lack of consistent response to diuretics not sure if this is truly diastolic HF though her BNP has been up in the past - DDx: low protein state, venous stasis, PAH/RV failure. Recent albumin 3.5 - Continue Jardiance and spiro - Will add torsemide 20 daily for now - Stressed need for compression hose - We discussed RHC. She wants to wait 1 months to ssif edema improves with conservative measures. If not will proceed with RHC  2. Tobacco use - Encouraged cessation  - hall walk today in clinic sats > 95%  3. CKD 3b - Scr runs 1.30-1.50 - follow  Arvilla Meres, MD  2:51 PM

## 2022-12-13 NOTE — Progress Notes (Signed)
Pulse oximetry on room air is 98 at rest. [facility] patient in hallway, greater than 60 feet and patient maintained O2 saturations of 95 percent or higher on room air.

## 2023-01-05 ENCOUNTER — Other Ambulatory Visit: Payer: Medicare Other

## 2023-01-07 ENCOUNTER — Encounter
Admission: RE | Admit: 2023-01-07 | Discharge: 2023-01-07 | Disposition: A | Discharge status: Home / Self Care | Payer: Medicare Other | Source: Ambulatory Visit | Attending: Internal Medicine | Admitting: Internal Medicine

## 2023-01-07 DIAGNOSIS — Z01812 Encounter for preprocedural laboratory examination: Secondary | ICD-10-CM | POA: Insufficient documentation

## 2023-01-11 ENCOUNTER — Encounter: Payer: Self-pay | Admitting: Family

## 2023-01-11 ENCOUNTER — Telehealth: Payer: Self-pay

## 2023-01-11 NOTE — Telephone Encounter (Signed)
Patient called to follow up on provider message earlier today about lower extremity swelling. Informed patient, per Darylene Price, FNP: Patient can take an extra torsemide tablet/day for 1 to 2 days, if needed. But can not endorse taking more than that without having lab results. Lab is unable to locate patient results, so patient would need labs redrawn.  We apologized for this inconvenience to patient.  Patient states she is unable to return for lab work this week. She states she will take an extra furosemide tablet tomorrow and call our office with results on Thursday, 1/18.

## 2023-01-23 NOTE — H&P (View-Only) (Signed)
ADVANCED HF CLINIC NOTE  Referring Physician: Darylene Price, NP Primary Care:Emma Ocie Bob, MD Primary Cardiologist: None   HPI:  Emma Emma Gould is a 67 y/o female with HTN, anxiety, depression, fibromyalgia, tobacco use and chronic diastolic heart failure referred by Emma Gould for further evalaution of her HF, .    Seen in ED 11/22/22 due to swelling in her face, hands, and bilateral legs. Hadn't been taking her coreg correctly or her HCTZ at all. Refused oral lasix. She was released. Admitted 10/30/22 due to generalized weakness. She states that she has been having left-sided> right-sided abdominal pain for at least the last 2 weeks. Started on ceftriaxone and metronidazole and IV fluids. GI consult obtained. CT abd-moderate diffuse colonic wall thickening in the transverse, descending, sigmoid colon with pericolonic stranding.  Lokelma given for hyperkalemia. Discharged after 2 days.    Seen by Emma Gould in [facility] for first visit on 11/26/22. Had 2+ edema. Furoscix prescribed and echo ordered. Had good response to Furoscix initially but then stopped working. Now with recurrent LE edema. Says it is hard to walk. + exertional dyspnea. No CP. Still smoking almost 1ppd  Echo 12/10/22 EF 81-27% Normal diastolic function. RV normal. Mild to moderate TR RVSP 1mmHg  I saw her for first time 12/13/22 for LE edema. . Added torsemide 20 daily. Stressed need for compression hose.  Recommended RHC.   Here for f/u. Has been weighing daily weight weight dropped from 156 to 145 with torsemide. Today back up to 152.  Still with severe LE edema. Says she is wearing compression hose but does not have them on today.  Feeling terrible. Weak and nauseated. She doubles torsemide for 1 day with no benefit. Having occasional chest pain and fluttering. No orthopnea or PND. + dyspnea on mild exertion. Still smoking about 1/2 ppd. Drinking a lot of fluid but says keeping it under 2L/day.   Past Medical  History:  Diagnosis Date   Anxiety    CHF (congestive heart failure) (HCC)    Depression    Fibromyalgia    Hypertension     Current Outpatient Medications  Medication Sig Dispense Refill   acetaminophen (TYLENOL) 325 MG tablet Take 2 tablets (650 mg total) by mouth every 6 (six) hours as needed for mild pain or headache (fever >/= 101).     albuterol (VENTOLIN HFA) 108 (90 Base) MCG/ACT inhaler Inhale 2 puffs into the lungs 4 (four) times daily as needed for shortness of breath.     carvedilol (COREG) 25 MG tablet Take 25 mg by mouth 2 (two) times daily with a meal.     Eszopiclone 3 MG TABS Take 3 mg by mouth at bedtime.     ezetimibe (ZETIA) 10 MG tablet Take 10 mg by mouth daily.     FUROSCIX 80 MG/10ML CTKT Inject into the skin.     omeprazole (PRILOSEC) 40 MG capsule Take 40 mg by mouth daily.     ondansetron (ZOFRAN-ODT) 8 MG disintegrating tablet Take by mouth.     oxyCODONE-acetaminophen (PERCOCET) 10-325 MG tablet Take 1 tablet by mouth 4 (four) times daily as needed for pain.     potassium chloride SA (KLOR-CON M) 20 MEQ tablet Take 2 tablets (40 mEq total) by mouth daily. And extra doses as needed 70 tablet 5   QUEtiapine (SEROQUEL) 25 MG tablet Take 1 tablet (25 mg total) by mouth at bedtime. (Patient taking differently: Take 100 mg by mouth at bedtime.) 30 tablet 0  sertraline (ZOLOFT) 50 MG tablet Take 50 mg by mouth daily.     spironolactone (ALDACTONE) 25 MG tablet Take 0.5 tablets (12.5 mg total) by mouth daily. 15 tablet 3   SUMAtriptan (IMITREX) 50 MG tablet Take 50 mg by mouth as needed for migraine. Take a second tablet 2 hours later if needed; max 200mg /day     topiramate (TOPAMAX) 50 MG tablet Take 50 mg by mouth daily as needed (migraine).     torsemide (DEMADEX) 20 MG tablet Take 1 tablet (20 mg total) by mouth daily. 30 tablet 3   No current facility-administered medications for this visit.    No Known Allergies    Social History   Socioeconomic History    Marital status: Widowed    Spouse name: Not on file   Number of children: Not on file   Years of education: Not on file   Highest education level: Not on file  Occupational History   Not on file  Tobacco Use   Smoking status: Every Day   Smokeless tobacco: Never  Substance and Sexual Activity   Alcohol use: Never   Drug use: Never   Sexual activity: Not on file  Other Topics Concern   Not on file  Social History Narrative   Not on file   Social Determinants of Health   Financial Resource Strain: Not on file  Food Insecurity: No Food Insecurity (10/30/2022)   Hunger Vital Sign    Worried About Running Out of Food in the Last Year: Never true    Ran Out of Food in the Last Year: Never true  Transportation Needs: No Transportation Needs (10/30/2022)   PRAPARE - Hydrologist (Medical): No    Lack of Transportation (Non-Medical): No  Physical Activity: Not on file  Stress: Not on file  Social Connections: Not on file  Intimate Partner Violence: Not At Risk (10/30/2022)   Humiliation, Afraid, Rape, and Kick questionnaire    Fear of Current or Ex-Partner: No    Emotionally Abused: No    Physically Abused: No    Sexually Abused: No      Family History  Problem Relation Age of Onset   Stroke Mother    Hypertension Mother    Lung cancer Father        Father died of lung cancer    Vitals:   01/25/23 0959  BP: 102/66  Pulse: 79  SpO2: 97%  Weight: 154 lb (69.9 kg)     PHYSICAL EXAM: General:  Sitting in chair. Weak appearing HEENT: normal Neck: supple. no JVD. Carotids 2+ bilat; no bruits. No lymphadenopathy or thryomegaly appreciated. Cor: PMI nondisplaced. Regular rate & rhythm. No rubs, gallops or murmurs. Lungs: decreased throughout  Abdomen: soft, nontender, nondistended. No hepatosplenomegaly. No bruits or masses. Good bowel sounds. Extremities: no cyanosis, clubbing, 2+ edema. + purpura Neuro: alert & orientedx3, cranial nerves  grossly intact. moves all 4 extremities w/o difficulty. Affect pleasant   ASSESSMENT & PLAN:  1. Persistent LE edema, dyspnea and chest pressure - Echo 12/10/22 EF 42-70% Normal diastolic function. RV normal. Mild to moderate TR RVSP 55mmHg - Remains markedly edematous on exam.  - ReDS 39% suggestive of volume overload/HF - Continue spiro. Refill Jardiance - Continue torsemide 20 daily for now - Stressed need for compression hose - Given persistent symptoms and multiple CRFs have decided to proceed with R/L cath to assess filling pressures, coronary anatomy and to look for possible pulmonary HTN -  Cut carvedilol to 12.5 bid due to low BP   2. Tobacco use - Encouraged cessation - hall walk in clinic sats > 95%  3. CKD 3b - Scr runs 1.30-1.50 - recheck labs - Refill Jardiance  Al Bracewell, MD  10:05 AM 

## 2023-01-23 NOTE — Progress Notes (Signed)
ADVANCED HF CLINIC NOTE  Referring Physician: Darylene Price, NP Primary Care:Silva Ocie Bob, MD Primary Cardiologist: None   HPI:  Emma Gould is a 67 y/o female with HTN, anxiety, depression, fibromyalgia, tobacco use and chronic diastolic heart failure referred by Darylene Price for further evalaution of her HF, .    Seen in ED 11/22/22 due to swelling in her face, hands, and bilateral legs. Hadn't been taking her coreg correctly or her HCTZ at all. Refused oral lasix. She was released. Admitted 10/30/22 due to generalized weakness. She states that she has been having left-sided> right-sided abdominal pain for at least the last 2 weeks. Started on ceftriaxone and metronidazole and IV fluids. GI consult obtained. CT abd-moderate diffuse colonic wall thickening in the transverse, descending, sigmoid colon with pericolonic stranding.  Lokelma given for hyperkalemia. Discharged after 2 days.    Seen by Emma. Hackney in [facility] for first visit on 11/26/22. Had 2+ edema. Furoscix prescribed and echo ordered. Had good response to Furoscix initially but then stopped working. Now with recurrent LE edema. Says it is hard to walk. + exertional dyspnea. No CP. Still smoking almost 1ppd  Echo 12/10/22 EF 81-27% Normal diastolic function. RV normal. Mild to moderate TR RVSP 1mmHg  I saw her for first time 12/13/22 for LE edema. . Added torsemide 20 daily. Stressed need for compression hose.  Recommended RHC.   Here for f/u. Has been weighing daily weight weight dropped from 156 to 145 with torsemide. Today back up to 152.  Still with severe LE edema. Says she is wearing compression hose but does not have them on today.  Feeling terrible. Weak and nauseated. She doubles torsemide for 1 day with no benefit. Having occasional chest pain and fluttering. No orthopnea or PND. + dyspnea on mild exertion. Still smoking about 1/2 ppd. Drinking a lot of fluid but says keeping it under 2L/day.   Past Medical  History:  Diagnosis Date   Anxiety    CHF (congestive heart failure) (HCC)    Depression    Fibromyalgia    Hypertension     Current Outpatient Medications  Medication Sig Dispense Refill   acetaminophen (TYLENOL) 325 MG tablet Take 2 tablets (650 mg total) by mouth every 6 (six) hours as needed for mild pain or headache (fever >/= 101).     albuterol (VENTOLIN HFA) 108 (90 Base) MCG/ACT inhaler Inhale 2 puffs into the lungs 4 (four) times daily as needed for shortness of breath.     carvedilol (COREG) 25 MG tablet Take 25 mg by mouth 2 (two) times daily with a meal.     Eszopiclone 3 MG TABS Take 3 mg by mouth at bedtime.     ezetimibe (ZETIA) 10 MG tablet Take 10 mg by mouth daily.     FUROSCIX 80 MG/10ML CTKT Inject into the skin.     omeprazole (PRILOSEC) 40 MG capsule Take 40 mg by mouth daily.     ondansetron (ZOFRAN-ODT) 8 MG disintegrating tablet Take by mouth.     oxyCODONE-acetaminophen (PERCOCET) 10-325 MG tablet Take 1 tablet by mouth 4 (four) times daily as needed for pain.     potassium chloride SA (KLOR-CON M) 20 MEQ tablet Take 2 tablets (40 mEq total) by mouth daily. And extra doses as needed 70 tablet 5   QUEtiapine (SEROQUEL) 25 MG tablet Take 1 tablet (25 mg total) by mouth at bedtime. (Patient taking differently: Take 100 mg by mouth at bedtime.) 30 tablet 0  sertraline (ZOLOFT) 50 MG tablet Take 50 mg by mouth daily.     spironolactone (ALDACTONE) 25 MG tablet Take 0.5 tablets (12.5 mg total) by mouth daily. 15 tablet 3   SUMAtriptan (IMITREX) 50 MG tablet Take 50 mg by mouth as needed for migraine. Take a second tablet 2 hours later if needed; max 200mg /day     topiramate (TOPAMAX) 50 MG tablet Take 50 mg by mouth daily as needed (migraine).     torsemide (DEMADEX) 20 MG tablet Take 1 tablet (20 mg total) by mouth daily. 30 tablet 3   No current facility-administered medications for this visit.    No Known Allergies    Social History   Socioeconomic History    Marital status: Widowed    Spouse name: Not on file   Number of children: Not on file   Years of education: Not on file   Highest education level: Not on file  Occupational History   Not on file  Tobacco Use   Smoking status: Every Day   Smokeless tobacco: Never  Substance and Sexual Activity   Alcohol use: Never   Drug use: Never   Sexual activity: Not on file  Other Topics Concern   Not on file  Social History Narrative   Not on file   Social Determinants of Health   Financial Resource Strain: Not on file  Food Insecurity: No Food Insecurity (10/30/2022)   Hunger Vital Sign    Worried About Running Out of Food in the Last Year: Never true    Ran Out of Food in the Last Year: Never true  Transportation Needs: No Transportation Needs (10/30/2022)   PRAPARE - Hydrologist (Medical): No    Lack of Transportation (Non-Medical): No  Physical Activity: Not on file  Stress: Not on file  Social Connections: Not on file  Intimate Partner Violence: Not At Risk (10/30/2022)   Humiliation, Afraid, Rape, and Kick questionnaire    Fear of Current or Ex-Partner: No    Emotionally Abused: No    Physically Abused: No    Sexually Abused: No      Family History  Problem Relation Age of Onset   Stroke Mother    Hypertension Mother    Lung cancer Father        Father died of lung cancer    Vitals:   01/25/23 0959  BP: 102/66  Pulse: 79  SpO2: 97%  Weight: 154 lb (69.9 kg)     PHYSICAL EXAM: General:  Sitting in chair. Weak appearing HEENT: normal Neck: supple. no JVD. Carotids 2+ bilat; no bruits. No lymphadenopathy or thryomegaly appreciated. Cor: PMI nondisplaced. Regular rate & rhythm. No rubs, gallops or murmurs. Lungs: decreased throughout  Abdomen: soft, nontender, nondistended. No hepatosplenomegaly. No bruits or masses. Good bowel sounds. Extremities: no cyanosis, clubbing, 2+ edema. + purpura Neuro: alert & orientedx3, cranial nerves  grossly intact. moves all 4 extremities w/o difficulty. Affect pleasant   ASSESSMENT & PLAN:  1. Persistent LE edema, dyspnea and chest pressure - Echo 12/10/22 EF 42-70% Normal diastolic function. RV normal. Mild to moderate TR RVSP 55mmHg - Remains markedly edematous on exam.  - ReDS 39% suggestive of volume overload/HF - Continue spiro. Refill Jardiance - Continue torsemide 20 daily for now - Stressed need for compression hose - Given persistent symptoms and multiple CRFs have decided to proceed with R/L cath to assess filling pressures, coronary anatomy and to look for possible pulmonary HTN -  Cut carvedilol to 12.5 bid due to low BP   2. Tobacco use - Encouraged cessation - hall walk in clinic sats > 95%  3. CKD 3b - Scr runs 1.30-1.50 - recheck labs - Refill Jardiance  Arvilla Meres, MD  10:05 AM

## 2023-01-25 ENCOUNTER — Other Ambulatory Visit: Payer: Self-pay | Admitting: Family

## 2023-01-25 ENCOUNTER — Other Ambulatory Visit
Admission: RE | Admit: 2023-01-25 | Discharge: 2023-01-25 | Disposition: A | Discharge status: Home / Self Care | Payer: Medicare Other | Source: Ambulatory Visit | Attending: Internal Medicine | Admitting: Internal Medicine

## 2023-01-25 ENCOUNTER — Telehealth: Payer: Self-pay | Admitting: Family

## 2023-01-25 ENCOUNTER — Encounter: Payer: Self-pay | Admitting: Internal Medicine

## 2023-01-25 ENCOUNTER — Encounter: Payer: Self-pay | Admitting: Family

## 2023-01-25 ENCOUNTER — Other Ambulatory Visit (HOSPITAL_COMMUNITY): Payer: Self-pay

## 2023-01-25 ENCOUNTER — Other Ambulatory Visit: Payer: Self-pay

## 2023-01-25 ENCOUNTER — Ambulatory Visit: Payer: Medicare Other | Attending: Internal Medicine | Admitting: Internal Medicine

## 2023-01-25 VITALS — BP 102/66 | HR 79 | Wt 154.0 lb

## 2023-01-25 DIAGNOSIS — R110 Nausea: Secondary | ICD-10-CM | POA: Insufficient documentation

## 2023-01-25 DIAGNOSIS — F17210 Nicotine dependence, cigarettes, uncomplicated: Secondary | ICD-10-CM | POA: Insufficient documentation

## 2023-01-25 DIAGNOSIS — Z716 Tobacco abuse counseling: Secondary | ICD-10-CM | POA: Diagnosis not present

## 2023-01-25 DIAGNOSIS — I130 Hypertensive heart and chronic kidney disease with heart failure and stage 1 through stage 4 chronic kidney disease, or unspecified chronic kidney disease: Secondary | ICD-10-CM | POA: Insufficient documentation

## 2023-01-25 DIAGNOSIS — I5032 Chronic diastolic (congestive) heart failure: Secondary | ICD-10-CM | POA: Insufficient documentation

## 2023-01-25 DIAGNOSIS — N1832 Chronic kidney disease, stage 3b: Secondary | ICD-10-CM | POA: Diagnosis not present

## 2023-01-25 DIAGNOSIS — Z7984 Long term (current) use of oral hypoglycemic drugs: Secondary | ICD-10-CM | POA: Diagnosis not present

## 2023-01-25 DIAGNOSIS — R600 Localized edema: Secondary | ICD-10-CM | POA: Diagnosis not present

## 2023-01-25 DIAGNOSIS — R0609 Other forms of dyspnea: Secondary | ICD-10-CM | POA: Diagnosis not present

## 2023-01-25 DIAGNOSIS — R0789 Other chest pain: Secondary | ICD-10-CM | POA: Diagnosis not present

## 2023-01-25 DIAGNOSIS — Z720 Tobacco use: Secondary | ICD-10-CM | POA: Diagnosis not present

## 2023-01-25 DIAGNOSIS — R609 Edema, unspecified: Secondary | ICD-10-CM | POA: Insufficient documentation

## 2023-01-25 DIAGNOSIS — E875 Hyperkalemia: Secondary | ICD-10-CM | POA: Diagnosis not present

## 2023-01-25 DIAGNOSIS — Z79899 Other long term (current) drug therapy: Secondary | ICD-10-CM | POA: Diagnosis not present

## 2023-01-25 LAB — CBC
HCT: 38.8 % (ref 36.0–46.0)
Hemoglobin: 13 g/dL (ref 12.0–15.0)
MCH: 35.2 pg — ABNORMAL HIGH (ref 26.0–34.0)
MCHC: 33.5 g/dL (ref 30.0–36.0)
MCV: 105.1 fL — ABNORMAL HIGH (ref 80.0–100.0)
Platelets: 256 10*3/uL (ref 150–400)
RBC: 3.69 MIL/uL — ABNORMAL LOW (ref 3.87–5.11)
RDW: 13.9 % (ref 11.5–15.5)
WBC: 14.9 10*3/uL — ABNORMAL HIGH (ref 4.0–10.5)
nRBC: 0.3 % — ABNORMAL HIGH (ref 0.0–0.2)

## 2023-01-25 LAB — BASIC METABOLIC PANEL
Anion gap: 13 (ref 5–15)
BUN: 29 mg/dL — ABNORMAL HIGH (ref 8–23)
CO2: 25 mmol/L (ref 22–32)
Calcium: 8.3 mg/dL — ABNORMAL LOW (ref 8.9–10.3)
Chloride: 98 mmol/L (ref 98–111)
Creatinine, Ser: 1.53 mg/dL — ABNORMAL HIGH (ref 0.44–1.00)
GFR, Estimated: 38 mL/min — ABNORMAL LOW (ref >60)
Glucose, Bld: 95 mg/dL (ref 70–99)
Potassium: 2.3 mmol/L — CL (ref 3.5–5.1)
Sodium: 136 mmol/L (ref 135–145)

## 2023-01-25 LAB — BRAIN NATRIURETIC PEPTIDE: B Natriuretic Peptide: 61.6 pg/mL (ref 0.0–100.0)

## 2023-01-25 MED ORDER — EMPAGLIFLOZIN 10 MG PO TABS: 10.0000 mg | ORAL_TABLET | Freq: Every day | ORAL | 6 refills | Status: DC

## 2023-01-25 MED ORDER — POTASSIUM CHLORIDE CRYS ER 20 MEQ PO TBCR: EXTENDED_RELEASE_TABLET | ORAL | 5 refills | Status: DC

## 2023-01-25 MED ORDER — CARVEDILOL 12.5 MG PO TABS: 12.5000 mg | ORAL_TABLET | Freq: Two times a day (BID) | ORAL | 3 refills | Status: DC

## 2023-01-25 NOTE — Telephone Encounter (Signed)
Received a call from the lab regarding critical K+ of 2.3. LM telling her to take 133meq potassium today (8 tablets in divided doses) and then make sure she takes 56meq every day beginning tomorrow. Will call her back later to make sure she gets this message.

## 2023-01-25 NOTE — Telephone Encounter (Signed)
Patient returned my call and she was instructed to take 8 potassium tablets today and then 2 every day starting tomorrow due to K of 2.3. Labs will be rechecked tomorrow. Patient verbalized understanding.

## 2023-01-25 NOTE — Patient Instructions (Addendum)
Medication Changes:  Take Carvedilol 12.5 mg twice a day.    Lab Work:  Labs done today, your results will be available in MyChart, we will contact you for abnormal readings.   Testing/Procedures:  [facility] Cardiac Cath Instructions  You are scheduled for a Cardiac Cath on:___1/31/2024  @1130______________________  Please arrive at __1030_____am on the day of your procedure Please expect a call from our [place] to pre-register you Do not eat/drink anything after midnight Someone will need to drive you home It is recommended someone be with you for the first 24 hours after your procedure Wear clothes that are easy to get on/off and wear slip on shoes if possible   Medications bring a current list of all medications with you  ___ Do not take these medications before your procedure:___Spironolactone, torsemide and jardiance_____  Day of your procedure: Arrive at the [place] entrance.  Free valet service is available.  After entering the [facility] please check-in at the registration desk (1st desk on your right) to receive your armband. After receiving your armband someone will escort you to the cardiac cath/special procedures waiting area.  The usual length of stay after your procedure is about 2 to 3 hours.  This can vary.  If you have any questions, please call our office at [phone], or you may call the cardiac cath lab at [facility] directly at [phone]     Special Instructions // Education:  Do the following things EVERYDAY: Weigh yourself in the morning before breakfast. Write it down and keep it in a log. Take your medicines as prescribed Eat low salt foods--Limit salt (sodium) to 2000 mg per day.  Stay as active as you can everyday Limit all fluids for the day to less than 2 liters   Follow-Up in: follow up in 2 to 3 months. Clinic will call you to set up your appointment.    If you have any questions or concerns before your next  appointment please send [place] a message through [place] or call our office at [phone] Monday-Friday 8 am-5 pm.   If you have an urgent need after hours on the weekend please call your Primary Cardiologist or the [facility] in [place] at [phone].

## 2023-01-26 ENCOUNTER — Ambulatory Visit
Admission: RE | Admit: 2023-01-26 | Discharge: 2023-01-26 | Disposition: A | Discharge status: Home / Self Care | Payer: Medicare Other | Attending: Internal Medicine | Admitting: Internal Medicine

## 2023-01-26 ENCOUNTER — Other Ambulatory Visit: Payer: Self-pay

## 2023-01-26 ENCOUNTER — Telehealth: Payer: Self-pay | Admitting: *Deleted

## 2023-01-26 ENCOUNTER — Encounter: Payer: Self-pay | Admitting: Internal Medicine

## 2023-01-26 ENCOUNTER — Encounter
Admission: RE | Disposition: A | Discharge status: Home / Self Care | Payer: Self-pay | Source: Home / Self Care | Attending: Internal Medicine

## 2023-01-26 DIAGNOSIS — I5032 Chronic diastolic (congestive) heart failure: Secondary | ICD-10-CM | POA: Insufficient documentation

## 2023-01-26 DIAGNOSIS — F419 Anxiety disorder, unspecified: Secondary | ICD-10-CM | POA: Insufficient documentation

## 2023-01-26 DIAGNOSIS — M797 Fibromyalgia: Secondary | ICD-10-CM | POA: Insufficient documentation

## 2023-01-26 DIAGNOSIS — Z79899 Other long term (current) drug therapy: Secondary | ICD-10-CM | POA: Insufficient documentation

## 2023-01-26 DIAGNOSIS — I130 Hypertensive heart and chronic kidney disease with heart failure and stage 1 through stage 4 chronic kidney disease, or unspecified chronic kidney disease: Secondary | ICD-10-CM | POA: Diagnosis not present

## 2023-01-26 DIAGNOSIS — I509 Heart failure, unspecified: Secondary | ICD-10-CM | POA: Diagnosis not present

## 2023-01-26 DIAGNOSIS — N1832 Chronic kidney disease, stage 3b: Secondary | ICD-10-CM | POA: Diagnosis not present

## 2023-01-26 DIAGNOSIS — I2510 Atherosclerotic heart disease of native coronary artery without angina pectoris: Secondary | ICD-10-CM

## 2023-01-26 DIAGNOSIS — R600 Localized edema: Secondary | ICD-10-CM | POA: Diagnosis not present

## 2023-01-26 DIAGNOSIS — F32A Depression, unspecified: Secondary | ICD-10-CM | POA: Diagnosis not present

## 2023-01-26 DIAGNOSIS — R0789 Other chest pain: Secondary | ICD-10-CM | POA: Diagnosis present

## 2023-01-26 DIAGNOSIS — F17210 Nicotine dependence, cigarettes, uncomplicated: Secondary | ICD-10-CM | POA: Insufficient documentation

## 2023-01-26 HISTORY — DX: Chronic kidney disease, unspecified: N18.9

## 2023-01-26 HISTORY — PX: RIGHT/LEFT HEART CATH AND CORONARY ANGIOGRAPHY: CATH118266

## 2023-01-26 LAB — POCT I-STAT 7, (LYTES, BLD GAS, ICA,H+H)
Acid-base deficit: 3 mmol/L — ABNORMAL HIGH (ref 0.0–2.0)
Bicarbonate: 21.7 mmol/L (ref 20.0–28.0)
Calcium, Ion: 1.07 mmol/L — ABNORMAL LOW (ref 1.15–1.40)
HCT: 32 % — ABNORMAL LOW (ref 36.0–46.0)
Hemoglobin: 10.9 g/dL — ABNORMAL LOW (ref 12.0–15.0)
O2 Saturation: 95 %
Potassium: 3.8 mmol/L (ref 3.5–5.1)
Sodium: 141 mmol/L (ref 135–145)
TCO2: 23 mmol/L (ref 22–32)
pCO2 arterial: 35.6 mmHg (ref 32–48)
pH, Arterial: 7.393 (ref 7.35–7.45)
pO2, Arterial: 77 mmHg — ABNORMAL LOW (ref 83–108)

## 2023-01-26 LAB — POCT I-STAT EG7
Acid-base deficit: 2 mmol/L (ref 0.0–2.0)
Acid-base deficit: 3 mmol/L — ABNORMAL HIGH (ref 0.0–2.0)
Bicarbonate: 22 mmol/L (ref 20.0–28.0)
Bicarbonate: 23.3 mmol/L (ref 20.0–28.0)
Calcium, Ion: 0.98 mmol/L — ABNORMAL LOW (ref 1.15–1.40)
Calcium, Ion: 1.14 mmol/L — ABNORMAL LOW (ref 1.15–1.40)
HCT: 31 % — ABNORMAL LOW (ref 36.0–46.0)
HCT: 33 % — ABNORMAL LOW (ref 36.0–46.0)
Hemoglobin: 10.5 g/dL — ABNORMAL LOW (ref 12.0–15.0)
Hemoglobin: 11.2 g/dL — ABNORMAL LOW (ref 12.0–15.0)
O2 Saturation: 64 %
O2 Saturation: 66 %
Potassium: 3.4 mmol/L — ABNORMAL LOW (ref 3.5–5.1)
Potassium: 4 mmol/L (ref 3.5–5.1)
Sodium: 140 mmol/L (ref 135–145)
Sodium: 142 mmol/L (ref 135–145)
TCO2: 23 mmol/L (ref 22–32)
TCO2: 24 mmol/L (ref 22–32)
pCO2, Ven: 38.2 mmHg — ABNORMAL LOW (ref 44–60)
pCO2, Ven: 39.9 mmHg — ABNORMAL LOW (ref 44–60)
pH, Ven: 7.369 (ref 7.25–7.43)
pH, Ven: 7.374 (ref 7.25–7.43)
pO2, Ven: 34 mmHg (ref 32–45)
pO2, Ven: 35 mmHg (ref 32–45)

## 2023-01-26 LAB — BASIC METABOLIC PANEL
Anion gap: 11 (ref 5–15)
BUN: 23 mg/dL (ref 8–23)
CO2: 22 mmol/L (ref 22–32)
Calcium: 8.1 mg/dL — ABNORMAL LOW (ref 8.9–10.3)
Chloride: 107 mmol/L (ref 98–111)
Creatinine, Ser: 1.11 mg/dL — ABNORMAL HIGH (ref 0.44–1.00)
GFR, Estimated: 56 mL/min — ABNORMAL LOW (ref >60)
Glucose, Bld: 92 mg/dL (ref 70–99)
Potassium: 3.9 mmol/L (ref 3.5–5.1)
Sodium: 140 mmol/L (ref 135–145)

## 2023-01-26 SURGERY — RIGHT/LEFT HEART CATH AND CORONARY ANGIOGRAPHY
Anesthesia: Moderate Sedation | Laterality: Bilateral

## 2023-01-26 MED ORDER — SODIUM CHLORIDE 0.9 % IV SOLN: INTRAVENOUS | Status: DC

## 2023-01-26 MED ORDER — ONDANSETRON HCL 4 MG/2ML IJ SOLN: 4.0000 mg | Freq: Four times a day (QID) | INTRAMUSCULAR | Status: DC | PRN

## 2023-01-26 MED ORDER — ACETAMINOPHEN 325 MG PO TABS: 650.0000 mg | ORAL_TABLET | ORAL | Status: DC | PRN

## 2023-01-26 MED ORDER — VERAPAMIL HCL 2.5 MG/ML IV SOLN
INTRAVENOUS | Status: AC
  Filled 2023-01-26: qty 2

## 2023-01-26 MED ORDER — HEPARIN (PORCINE) IN NACL 1000-0.9 UT/500ML-% IV SOLN
INTRAVENOUS | Status: DC | PRN
  Administered 2023-01-26 (×2): 500 mL

## 2023-01-26 MED ORDER — HEPARIN SODIUM (PORCINE) 1000 UNIT/ML IJ SOLN
INTRAMUSCULAR | Status: AC
  Filled 2023-01-26: qty 10

## 2023-01-26 MED ORDER — HYDRALAZINE HCL 20 MG/ML IJ SOLN: 10.0000 mg | INTRAMUSCULAR | Status: DC | PRN

## 2023-01-26 MED ORDER — SODIUM CHLORIDE 0.9 % IV SOLN: 250.0000 mL | INTRAVENOUS | Status: DC | PRN

## 2023-01-26 MED ORDER — LABETALOL HCL 5 MG/ML IV SOLN: 10.0000 mg | INTRAVENOUS | Status: DC | PRN

## 2023-01-26 MED ORDER — ASPIRIN 81 MG PO CHEW
81.0000 mg | CHEWABLE_TABLET | ORAL | Status: AC
  Administered 2023-01-26: 81 mg via ORAL

## 2023-01-26 MED ORDER — IOHEXOL 300 MG/ML  SOLN
INTRAMUSCULAR | Status: DC | PRN
  Administered 2023-01-26: 62 mL

## 2023-01-26 MED ORDER — HEPARIN SODIUM (PORCINE) 1000 UNIT/ML IJ SOLN
INTRAMUSCULAR | Status: DC | PRN
  Administered 2023-01-26: 3500 [IU] via INTRAVENOUS

## 2023-01-26 MED ORDER — SODIUM CHLORIDE 0.9% FLUSH: 3.0000 mL | Freq: Two times a day (BID) | INTRAVENOUS | Status: DC

## 2023-01-26 MED ORDER — MIDAZOLAM HCL 2 MG/2ML IJ SOLN
INTRAMUSCULAR | Status: AC
  Filled 2023-01-26: qty 2

## 2023-01-26 MED ORDER — ASPIRIN 81 MG PO CHEW
CHEWABLE_TABLET | ORAL | Status: AC
  Filled 2023-01-26: qty 1

## 2023-01-26 MED ORDER — SODIUM CHLORIDE 0.9% FLUSH: 3.0000 mL | INTRAVENOUS | Status: DC | PRN

## 2023-01-26 MED ORDER — FENTANYL CITRATE (PF) 100 MCG/2ML IJ SOLN
INTRAMUSCULAR | Status: DC | PRN
  Administered 2023-01-26 (×2): 25 ug via INTRAVENOUS

## 2023-01-26 MED ORDER — FENTANYL CITRATE (PF) 100 MCG/2ML IJ SOLN
INTRAMUSCULAR | Status: AC
  Filled 2023-01-26: qty 2

## 2023-01-26 MED ORDER — HEPARIN (PORCINE) IN NACL 1000-0.9 UT/500ML-% IV SOLN
INTRAVENOUS | Status: AC
  Filled 2023-01-26: qty 1000

## 2023-01-26 MED ORDER — VERAPAMIL HCL 2.5 MG/ML IV SOLN
INTRAVENOUS | Status: DC | PRN
  Administered 2023-01-26: 2.5 mg via INTRA_ARTERIAL

## 2023-01-26 MED ORDER — MIDAZOLAM HCL 2 MG/2ML IJ SOLN
INTRAMUSCULAR | Status: DC | PRN
  Administered 2023-01-26: 1 mg via INTRAVENOUS

## 2023-01-26 SURGICAL SUPPLY — 13 items
BAND CMPR LRG ZPHR (HEMOSTASIS) ×1
BAND ZEPHYR COMPRESS 30 LONG (HEMOSTASIS) IMPLANT
CATH 5FR JL3.5 JR4 ANG PIG MP (CATHETERS) IMPLANT
CATH BALLN WEDGE 5F 110CM (CATHETERS) IMPLANT
DRAPE BRACHIAL (DRAPES) IMPLANT
GLIDESHEATH SLEND SS 6F .021 (SHEATH) IMPLANT
GUIDEWIRE INQWIRE 1.5J.035X260 (WIRE) IMPLANT
INQWIRE 1.5J .035X260CM (WIRE) ×1
PACK CARDIAC CATH (CUSTOM PROCEDURE TRAY) IMPLANT
PROTECTION STATION PRESSURIZED (MISCELLANEOUS) ×1
SET ATX SIMPLICITY (MISCELLANEOUS) IMPLANT
SHEATH GLIDE SLENDER 4/5FR (SHEATH) IMPLANT
STATION PROTECTION PRESSURIZED (MISCELLANEOUS) IMPLANT

## 2023-01-26 NOTE — Interval H&P Note (Signed)
History and Physical Interval Note:  01/26/2023 12:00 PM  Emma Gould  has presented today for surgery, with the diagnosis of LE edema and chest pressure  HF  Chest pressure.  The various methods of treatment have been discussed with the patient and family. After consideration of risks, benefits and other options for treatment, the patient has consented to  Procedure(s): RIGHT/LEFT HEART CATH AND CORONARY ANGIOGRAPHY (Bilateral) as a surgical intervention.  The patient's history has been reviewed, patient examined, no change in status, stable for surgery.  I have reviewed the patient's chart and labs.  Questions were answered to the patient's satisfaction.     Bertrice Leder

## 2023-01-27 ENCOUNTER — Encounter: Payer: Self-pay | Admitting: Internal Medicine

## 2023-01-28 ENCOUNTER — Telehealth: Payer: Self-pay

## 2023-01-28 ENCOUNTER — Encounter: Payer: Self-pay | Admitting: Internal Medicine

## 2023-01-28 NOTE — Telephone Encounter (Signed)
Medication Samples have been provided to the patient.  Drug name: Furoscix       Strength: 80mg /73mL        Qty: 3  LOT: [id number]  Exp.Date: 09/25/2024  Instructions: Administer one injection daily for the next 3 days.   The patient has been instructed regarding the correct time, dose, and frequency of taking this medication, including desired effects and most common side effects.   Emma Gould 1:39 PM 01/28/2023

## 2023-01-28 NOTE — Telephone Encounter (Signed)
Called pt to inquire if she could pick up furoscix today, per her messages with provider r/t complaints of edema.  Per Darylene Price, FNP: Patient can pick up 3 samples of furoscix injections and take for the next 3 days. Pt should take 6 potassium tablets for the next 3 days with the furoscix and she must hold her torsemide while using the furoscix. Begin furoscix injection tomorrow since pt already had toresmide today. Pt verbalized understanding.  Pt states she has used furoscix before and she understands how to administer the medication and verbalized administration instructions.  Pt informed she will need to come in for labwork and appt next week. Instructed her to call us and we will see her in office at the same time as labs since transportation is difficult for her.

## 2023-01-28 NOTE — Telephone Encounter (Signed)
Message to provider for input

## 2023-02-02 ENCOUNTER — Other Ambulatory Visit
Admission: RE | Admit: 2023-02-02 | Discharge: 2023-02-02 | Disposition: A | Discharge status: Home / Self Care | Payer: Medicare Other | Source: Ambulatory Visit | Attending: Family | Admitting: Family

## 2023-02-02 ENCOUNTER — Other Ambulatory Visit: Payer: Self-pay | Admitting: Family

## 2023-02-02 ENCOUNTER — Encounter: Payer: Self-pay | Admitting: Family

## 2023-02-02 ENCOUNTER — Encounter: Payer: Self-pay | Admitting: Cardiology

## 2023-02-02 ENCOUNTER — Encounter: Payer: Medicare Other | Admitting: Family

## 2023-02-02 ENCOUNTER — Ambulatory Visit (HOSPITAL_BASED_OUTPATIENT_CLINIC_OR_DEPARTMENT_OTHER): Payer: Medicare Other | Admitting: Cardiology

## 2023-02-02 VITALS — BP 112/70 | HR 84 | Wt 152.8 lb

## 2023-02-02 DIAGNOSIS — I5032 Chronic diastolic (congestive) heart failure: Secondary | ICD-10-CM

## 2023-02-02 DIAGNOSIS — F329 Major depressive disorder, single episode, unspecified: Secondary | ICD-10-CM | POA: Diagnosis not present

## 2023-02-02 LAB — BASIC METABOLIC PANEL
Anion gap: 12 (ref 5–15)
BUN: 23 mg/dL (ref 8–23)
CO2: 23 mmol/L (ref 22–32)
Calcium: 8 mg/dL — ABNORMAL LOW (ref 8.9–10.3)
Chloride: 100 mmol/L (ref 98–111)
Creatinine, Ser: 1.32 mg/dL — ABNORMAL HIGH (ref 0.44–1.00)
GFR, Estimated: 45 mL/min — ABNORMAL LOW (ref >60)
Glucose, Bld: 104 mg/dL — ABNORMAL HIGH (ref 70–99)
Potassium: 2.9 mmol/L — ABNORMAL LOW (ref 3.5–5.1)
Sodium: 135 mmol/L (ref 135–145)

## 2023-02-02 LAB — BRAIN NATRIURETIC PEPTIDE: B Natriuretic Peptide: 70.1 pg/mL (ref 0.0–100.0)

## 2023-02-02 MED ORDER — POTASSIUM CHLORIDE CRYS ER 20 MEQ PO TBCR: EXTENDED_RELEASE_TABLET | ORAL | 5 refills | Status: DC

## 2023-02-02 MED ORDER — TORSEMIDE 20 MG PO TABS: ORAL_TABLET | ORAL | 3 refills | Status: DC

## 2023-02-02 MED ORDER — METOLAZONE 2.5 MG PO TABS: 2.5000 mg | ORAL_TABLET | ORAL | 0 refills | Status: DC

## 2023-02-02 MED ORDER — METOLAZONE 2.5 MG PO TABS: 2.5000 mg | ORAL_TABLET | Freq: Every day | ORAL | 0 refills | Status: DC

## 2023-02-02 MED ORDER — TORSEMIDE 20 MG PO TABS: 40.0000 mg | ORAL_TABLET | Freq: Every day | ORAL | 3 refills | Status: DC

## 2023-02-02 NOTE — Patient Instructions (Addendum)
Labs done today. We will contact you only if your labs are abnormal.  INCREASE Torsemide to 60mg  (3 tablets) by mouth daily for 5 days. THEN DECREASE to 40mg  (2 tablets) by mouth daily.   TAKE Metolazone 2.5mg  (1 tablet) by mouth daily for 2 days.  No other medication changes were made. Please continue all current medications as prescribed.  Your physician recommends that you schedule a follow-up appointment in: with Dr. Daniel Nones 2/29 at 10am  If you have any questions or concerns before your next appointment please send [place] a message through [facility] or call our office at [phone].    TO LEAVE A MESSAGE FOR THE NURSE SELECT OPTION 2, PLEASE LEAVE A MESSAGE INCLUDING: YOUR NAME DATE OF BIRTH CALL BACK NUMBER REASON FOR CALL**this is important as we prioritize the call backs  YOU WILL RECEIVE A CALL BACK THE SAME DAY AS LONG AS YOU CALL BEFORE 4:00 PM   Do the following things EVERYDAY: Weigh yourself in the morning before breakfast. Write it down and keep it in a log. Take your medicines as prescribed Eat low salt foods--Limit salt (sodium) to 2000 mg per day.  Stay as active as you can everyday Limit all fluids for the day to less than 2 liters   At the [facility], you and your health needs are our priority. As part of our continuing mission to provide you with exceptional heart care, we have created designated Provider Care Teams. These Care Teams include your primary Cardiologist (physician) and Advanced Practice Providers (APPs- Physician Assistants and Nurse Practitioners) who all work together to provide you with the care you need, when you need it.   You may see any of the following providers on your designated Care Team at your next follow up: Dr Glori Bickers Dr Haynes Kerns, NP Lyda Jester, [place] Audry Riles, PharmD   Please be sure to bring in all your medications bottles to every appointment.

## 2023-02-02 NOTE — Progress Notes (Signed)
ADVANCED HF CLINIC NOTE  Referring Physician: Darylene Price, NP Primary Care:Silva Ocie Bob, MD Primary Cardiologist: None   HPI:  Emma Gould is a 67 y/o female with HTN, anxiety, depression, fibromyalgia, tobacco use and chronic diastolic heart failure referred by Darylene Price for further evalaution of her HF, .    Seen in ED 11/22/22 due to swelling in her face, hands, and bilateral legs. Hadn't been taking her coreg correctly or her HCTZ at all. Refused oral lasix. She was released. Admitted 10/30/22 due to generalized weakness. She states that she has been having left-sided> right-sided abdominal pain for at least the last 2 weeks. Started on ceftriaxone and metronidazole and IV fluids. GI consult obtained. CT abd-moderate diffuse colonic wall thickening in the transverse, descending, sigmoid colon with pericolonic stranding.  Lokelma given for hyperkalemia. Discharged after 2 days.    Seen by Emma Gould in [facility] for first visit on 11/26/22. Had 2+ edema. Furoscix prescribed and echo ordered. Had good response to Furoscix initially but then stopped working. Now with recurrent LE edema. Says it is hard to walk. + exertional dyspnea. No CP. Still smoking almost 1ppd  Echo 12/10/22 EF 93-79% Normal diastolic function. RV normal. Mild to moderate TR RVSP 48mmHg  Since her last appointment, she has undergone a RHC w/ RA 14, PCW 19, CI 2.9, PA mean of 24. She was briefly on furoscix but reports that it did not lead to much augmentation of diuresis. Today her weight is back up to 153lbs on torsemide 20mg  daily currently. Reports weakness, difficulty ambulating, LE edema, shaking, depression & pain consistent with fibromyalgia.   Past Medical History:  Diagnosis Date   Anxiety    CHF (congestive heart failure) (HCC)    CKD (chronic kidney disease)    Depression    Fibromyalgia    Hypertension     Current Outpatient Medications  Medication Sig Dispense Refill   acetaminophen  (TYLENOL) 325 MG tablet Take 2 tablets (650 mg total) by mouth every 6 (six) hours as needed for mild pain or headache (fever >/= 101).     albuterol (VENTOLIN HFA) 108 (90 Base) MCG/ACT inhaler Inhale 2 puffs into the lungs 4 (four) times daily as needed for shortness of breath.     carvedilol (COREG) 12.5 MG tablet Take 1 tablet (12.5 mg total) by mouth 2 (two) times daily with a meal. 60 tablet 3   empagliflozin (JARDIANCE) 10 MG TABS tablet Take 1 tablet (10 mg total) by mouth daily before breakfast. 30 tablet 6   Eszopiclone 3 MG TABS Take 3 mg by mouth at bedtime.     ezetimibe (ZETIA) 10 MG tablet Take 10 mg by mouth daily.     FUROSCIX 80 MG/10ML CTKT Inject into the skin.     omeprazole (PRILOSEC) 40 MG capsule Take 40 mg by mouth daily.     ondansetron (ZOFRAN-ODT) 8 MG disintegrating tablet Take by mouth.     oxyCODONE-acetaminophen (PERCOCET) 10-325 MG tablet Take 1 tablet by mouth 4 (four) times daily as needed for pain.     potassium chloride SA (KLOR-CON M) 20 MEQ tablet Take 8 potassium tablets today only in divided doses then 2 tablets every morning 70 tablet 5   QUEtiapine (SEROQUEL) 25 MG tablet Take 1 tablet (25 mg total) by mouth at bedtime. (Patient taking differently: Take 100 mg by mouth at bedtime.) 30 tablet 0   sertraline (ZOLOFT) 50 MG tablet Take 50 mg by mouth daily.  spironolactone (ALDACTONE) 25 MG tablet Take 0.5 tablets (12.5 mg total) by mouth daily. 15 tablet 3   SUMAtriptan (IMITREX) 50 MG tablet Take 50 mg by mouth as needed for migraine. Take a second tablet 2 hours later if needed; max 200mg /day     topiramate (TOPAMAX) 50 MG tablet Take 50 mg by mouth daily as needed (migraine).     torsemide (DEMADEX) 20 MG tablet Take 1 tablet (20 mg total) by mouth daily. 30 tablet 3   No current facility-administered medications for this visit.    No Known Allergies    Social History   Socioeconomic History   Marital status: Widowed    Spouse name: Not on  file   Number of children: Not on file   Years of education: Not on file   Highest education level: Not on file  Occupational History   Not on file  Tobacco Use   Smoking status: Every Day    Packs/day: 0.50    Years: 45.00    Total pack years: 22.50    Types: Cigarettes   Smokeless tobacco: Never  Substance and Sexual Activity   Alcohol use: Never   Drug use: Never   Sexual activity: Not on file  Other Topics Concern   Not on file  Social History Narrative   Not on file   Social Determinants of Health   Financial Resource Strain: Not on file  Food Insecurity: No Food Insecurity (10/30/2022)   Hunger Vital Sign    Worried About Running Out of Food in the Last Year: Never true    Ran Out of Food in the Last Year: Never true  Transportation Needs: No Transportation Needs (10/30/2022)   PRAPARE - Hydrologist (Medical): No    Lack of Transportation (Non-Medical): No  Physical Activity: Not on file  Stress: Not on file  Social Connections: Not on file  Intimate Partner Violence: Not At Risk (10/30/2022)   Humiliation, Afraid, Rape, and Kick questionnaire    Fear of Current or Ex-Partner: No    Emotionally Abused: No    Physically Abused: No    Sexually Abused: No      Family History  Problem Relation Age of Onset   Stroke Mother    Hypertension Mother    Lung cancer Father        Father died of lung cancer    Vitals:   02/02/23 1018  BP: 112/70  Pulse: 84  SpO2: 100%  Weight: 152 lb 12.8 oz (69.3 kg)     PHYSICAL EXAM: General:  Sitting in chair. Weak appearing HEENT: normal Neck: supple. no JVD. Carotids 2+ bilat; no bruits. No lymphadenopathy or thryomegaly appreciated. Cor: PMI nondisplaced. Regular rate & rhythm. No rubs, gallops or murmurs. Lungs: decreased throughout  Abdomen: soft, nontender, nondistended. No hepatosplenomegaly. No bruits or masses. Good bowel sounds. Extremities: no cyanosis, clubbing, 3+ edema to the  knees.  Neuro: alert & orientedx3, cranial nerves grossly intact. moves all 4 extremities w/o difficulty. Affect pleasant  RHC/LHC: 01/26/23  Ao = 144/74 (104) LV = 148/18 RA =  14 RV = 38/14 PA = 35/17 (24) PCW = 19 Fick cardiac output/index = 5.0/2.9 PVR = 1.0 FA sat = 95% PA sat = 64%, 66% PAPi = 1.3   Assessment: 1. 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 LVEF 60-65% with mildly elevated LVEDP 3. 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 Normal cardiac  output    ASSESSMENT & PLAN:  1. Persistent LE edema, dyspnea and chest pressure - Echo 12/10/22 EF 43-83% Normal diastolic function. RV normal. Mild to moderate TR RVSP 7mmHg - Continue spiro & Jardiance - Stressed need for compression hose - RHC above consistent w/ suboptimal RV function, RA 14, PCW 19, CI 2.9, PVR 1.  - Continue coreg  - After decreasing torsemide to 20mg , Emma Gould reports worsening LE edema and shortness of breath. Her weight is back up to 153-154 today w/ 3+ pitting edema and crackles to mid lungs bilaterally. She also has many other complaints including diffuse pain, depression, tremors, frequent bouts of crying that I believe are affecting her overall ability to manage her health.  - Repeat labs today. Increase torsemide to 60mg  for 5 days, metolazone 2.5 for 2 days. Continue on torsemide 40mg  until our next appointment.   2. Tobacco use - Encouraged cessation - hall walk in clinic sats > 95%  3. CKD 3b - Scr runs 1.30-1.50 - recheck labs - Jardiance  Emma Klare, DO  10:26 AM

## 2023-02-03 ENCOUNTER — Telehealth: Payer: Self-pay

## 2023-02-03 DIAGNOSIS — I5032 Chronic diastolic (congestive) heart failure: Secondary | ICD-10-CM

## 2023-02-03 NOTE — Telephone Encounter (Signed)
Called patient to ensure she had received provider message and dosing information for increasing potassium, per Darylene Price, FNP.  Patient has increased potassium per provider instructions in message. Patient informed we need to recheck lab work one day next week.She verbalized understanding and states she will come to the [facility] one day next week for lab work.  Order for BMET placed. Pt had no further questions or concerns at this time.

## 2023-02-10 ENCOUNTER — Other Ambulatory Visit
Admission: RE | Admit: 2023-02-10 | Discharge: 2023-02-10 | Disposition: A | Discharge status: Home / Self Care | Payer: Medicare Other | Source: Ambulatory Visit | Attending: Family | Admitting: Family

## 2023-02-10 ENCOUNTER — Telehealth: Payer: Self-pay

## 2023-02-10 DIAGNOSIS — E875 Hyperkalemia: Secondary | ICD-10-CM | POA: Diagnosis present

## 2023-02-10 LAB — BASIC METABOLIC PANEL
Anion gap: 16 — ABNORMAL HIGH (ref 5–15)
BUN: 53 mg/dL — ABNORMAL HIGH (ref 8–23)
CO2: 26 mmol/L (ref 22–32)
Calcium: 8.5 mg/dL — ABNORMAL LOW (ref 8.9–10.3)
Chloride: 89 mmol/L — ABNORMAL LOW (ref 98–111)
Creatinine, Ser: 2.94 mg/dL — ABNORMAL HIGH (ref 0.44–1.00)
GFR, Estimated: 17 mL/min — ABNORMAL LOW (ref >60)
Glucose, Bld: 118 mg/dL — ABNORMAL HIGH (ref 70–99)
Potassium: 3.2 mmol/L — ABNORMAL LOW (ref 3.5–5.1)
Sodium: 131 mmol/L — ABNORMAL LOW (ref 135–145)

## 2023-02-10 NOTE — Telephone Encounter (Signed)
Patient called to confirm that she is coming for lab work (BMT) today. She reports that her weight is down from 151 on 2/8 to 137 lb today, which she says is baseline. She's currently taking torsemide 22m daily.  She reports that she is not feeling better. Complaints of loss of appetite, nausea, dizziness.  She also reports difficulty getting around related to feeling weak and sore. She asks if potassium could be making her sick on her stomach.  Pt unable to verbalize an exact amount of fluid consumption, but reports "I'm drinking very little". Encouraged patient to take note of how much she drinks today, and try to take small frequent sips.  Pt has run out of prescription for zofran. States she called and left message for PCP for refill, but hasn't heard anything.   Will route to provider for further instructions.  Informed patient we will reach back out with provider advice later today, or after we receive lab results.

## 2023-02-11 ENCOUNTER — Other Ambulatory Visit: Payer: Self-pay | Admitting: Family

## 2023-02-11 ENCOUNTER — Telehealth: Payer: Self-pay

## 2023-02-11 DIAGNOSIS — I5032 Chronic diastolic (congestive) heart failure: Secondary | ICD-10-CM

## 2023-02-11 MED ORDER — TORSEMIDE 20 MG PO TABS: 20.0000 mg | ORAL_TABLET | Freq: Every day | ORAL | 3 refills | Status: DC

## 2023-02-11 NOTE — Telephone Encounter (Signed)
Pt aware, agreeable, and verbalized understanding of medication, lab and fluid intake plan.  Pt stated she will come next week to get her lab work done.

## 2023-02-11 NOTE — Telephone Encounter (Signed)
-----   Message from Alisa Graff, [place] sent at 02/11/2023  8:03 AM EST ----- Kidney function has worsened with all the extra diuretic given so decrease your torsemide to 59m once daily. Continue the 2 potassium tablets twice daily. Make sure drinking at least 40-50 ounces daily. [facility]

## 2023-02-24 ENCOUNTER — Other Ambulatory Visit: Payer: Self-pay

## 2023-02-24 ENCOUNTER — Emergency Department: Payer: Medicare Other

## 2023-02-24 ENCOUNTER — Inpatient Hospital Stay
Admission: EM | Admit: 2023-02-24 | Discharge: 2023-02-26 | DRG: 641 | Disposition: A | Discharge status: Home / Self Care | Payer: Medicare Other | Attending: Internal Medicine | Admitting: Internal Medicine

## 2023-02-24 ENCOUNTER — Ambulatory Visit: Payer: Medicare Other | Attending: Cardiology | Admitting: Cardiology

## 2023-02-24 ENCOUNTER — Encounter: Payer: Self-pay | Admitting: Internal Medicine

## 2023-02-24 ENCOUNTER — Encounter: Payer: Self-pay | Admitting: Cardiology

## 2023-02-24 ENCOUNTER — Telehealth: Payer: Self-pay | Admitting: *Deleted

## 2023-02-24 ENCOUNTER — Other Ambulatory Visit
Admission: RE | Admit: 2023-02-24 | Discharge: 2023-02-24 | Disposition: A | Discharge status: Home / Self Care | Payer: Medicare Other | Source: Ambulatory Visit | Attending: Cardiology | Admitting: Cardiology

## 2023-02-24 VITALS — BP 126/78 | HR 73 | Resp 18 | Ht 63.0 in | Wt 141.0 lb

## 2023-02-24 DIAGNOSIS — K8020 Calculus of gallbladder without cholecystitis without obstruction: Secondary | ICD-10-CM | POA: Diagnosis not present

## 2023-02-24 DIAGNOSIS — F17210 Nicotine dependence, cigarettes, uncomplicated: Secondary | ICD-10-CM | POA: Diagnosis present

## 2023-02-24 DIAGNOSIS — N179 Acute kidney failure, unspecified: Secondary | ICD-10-CM | POA: Diagnosis present

## 2023-02-24 DIAGNOSIS — Z720 Tobacco use: Secondary | ICD-10-CM

## 2023-02-24 DIAGNOSIS — E785 Hyperlipidemia, unspecified: Secondary | ICD-10-CM | POA: Diagnosis present

## 2023-02-24 DIAGNOSIS — T502X5A Adverse effect of carbonic-anhydrase inhibitors, benzothiadiazides and other diuretics, initial encounter: Secondary | ICD-10-CM | POA: Diagnosis present

## 2023-02-24 DIAGNOSIS — E876 Hypokalemia: Secondary | ICD-10-CM | POA: Diagnosis present

## 2023-02-24 DIAGNOSIS — R600 Localized edema: Secondary | ICD-10-CM

## 2023-02-24 DIAGNOSIS — R1011 Right upper quadrant pain: Secondary | ICD-10-CM | POA: Diagnosis present

## 2023-02-24 DIAGNOSIS — I130 Hypertensive heart and chronic kidney disease with heart failure and stage 1 through stage 4 chronic kidney disease, or unspecified chronic kidney disease: Secondary | ICD-10-CM | POA: Diagnosis present

## 2023-02-24 DIAGNOSIS — I2510 Atherosclerotic heart disease of native coronary artery without angina pectoris: Secondary | ICD-10-CM | POA: Diagnosis present

## 2023-02-24 DIAGNOSIS — Z79899 Other long term (current) drug therapy: Secondary | ICD-10-CM | POA: Diagnosis not present

## 2023-02-24 DIAGNOSIS — M797 Fibromyalgia: Secondary | ICD-10-CM | POA: Diagnosis present

## 2023-02-24 DIAGNOSIS — Z7984 Long term (current) use of oral hypoglycemic drugs: Secondary | ICD-10-CM | POA: Diagnosis not present

## 2023-02-24 DIAGNOSIS — F411 Generalized anxiety disorder: Secondary | ICD-10-CM | POA: Diagnosis present

## 2023-02-24 DIAGNOSIS — R531 Weakness: Secondary | ICD-10-CM

## 2023-02-24 DIAGNOSIS — I10 Essential (primary) hypertension: Secondary | ICD-10-CM | POA: Diagnosis present

## 2023-02-24 DIAGNOSIS — K219 Gastro-esophageal reflux disease without esophagitis: Secondary | ICD-10-CM | POA: Diagnosis present

## 2023-02-24 DIAGNOSIS — F32A Depression, unspecified: Secondary | ICD-10-CM | POA: Diagnosis present

## 2023-02-24 DIAGNOSIS — I445 Left posterior fascicular block: Secondary | ICD-10-CM | POA: Diagnosis present

## 2023-02-24 DIAGNOSIS — N1832 Chronic kidney disease, stage 3b: Secondary | ICD-10-CM | POA: Diagnosis present

## 2023-02-24 DIAGNOSIS — I5032 Chronic diastolic (congestive) heart failure: Secondary | ICD-10-CM | POA: Insufficient documentation

## 2023-02-24 DIAGNOSIS — Z981 Arthrodesis status: Secondary | ICD-10-CM

## 2023-02-24 DIAGNOSIS — I25119 Atherosclerotic heart disease of native coronary artery with unspecified angina pectoris: Secondary | ICD-10-CM | POA: Diagnosis present

## 2023-02-24 DIAGNOSIS — R9431 Abnormal electrocardiogram [ECG] [EKG]: Secondary | ICD-10-CM

## 2023-02-24 DIAGNOSIS — N1831 Chronic kidney disease, stage 3a: Secondary | ICD-10-CM | POA: Diagnosis present

## 2023-02-24 LAB — LACTIC ACID, PLASMA
Lactic Acid, Venous: 0.9 mmol/L (ref 0.5–1.9)
Lactic Acid, Venous: 3.1 mmol/L (ref 0.5–1.9)

## 2023-02-24 LAB — COMPREHENSIVE METABOLIC PANEL
ALT: 13 U/L (ref 0–44)
ALT: 12 U/L (ref 0–44)
AST: 14 U/L — ABNORMAL LOW (ref 15–41)
AST: 20 U/L (ref 15–41)
Albumin: 3 g/dL — ABNORMAL LOW (ref 3.5–5.0)
Albumin: 2.9 g/dL — ABNORMAL LOW (ref 3.5–5.0)
Alkaline Phosphatase: 76 U/L (ref 38–126)
Alkaline Phosphatase: 73 U/L (ref 38–126)
Anion gap: 17 — ABNORMAL HIGH (ref 5–15)
Anion gap: 19 — ABNORMAL HIGH (ref 5–15)
BUN: 34 mg/dL — ABNORMAL HIGH (ref 8–23)
BUN: 34 mg/dL — ABNORMAL HIGH (ref 8–23)
CO2: 29 mmol/L (ref 22–32)
CO2: 25 mmol/L (ref 22–32)
Calcium: 8.6 mg/dL — ABNORMAL LOW (ref 8.9–10.3)
Calcium: 8.3 mg/dL — ABNORMAL LOW (ref 8.9–10.3)
Chloride: 88 mmol/L — ABNORMAL LOW (ref 98–111)
Chloride: 87 mmol/L — ABNORMAL LOW (ref 98–111)
Creatinine, Ser: 1.97 mg/dL — ABNORMAL HIGH (ref 0.44–1.00)
Creatinine, Ser: 2.09 mg/dL — ABNORMAL HIGH (ref 0.44–1.00)
GFR, Estimated: 28 mL/min — ABNORMAL LOW (ref >60)
GFR, Estimated: 26 mL/min — ABNORMAL LOW (ref >60)
Glucose, Bld: 103 mg/dL — ABNORMAL HIGH (ref 70–99)
Glucose, Bld: 122 mg/dL — ABNORMAL HIGH (ref 70–99)
Potassium: <2 mmol/L — CL (ref 3.5–5.1)
Potassium: <2 mmol/L — CL (ref 3.5–5.1)
Sodium: 134 mmol/L — ABNORMAL LOW (ref 135–145)
Sodium: 131 mmol/L — ABNORMAL LOW (ref 135–145)
Total Bilirubin: 0.5 mg/dL (ref 0.3–1.2)
Total Bilirubin: 0.5 mg/dL (ref 0.3–1.2)
Total Protein: 6.2 g/dL — ABNORMAL LOW (ref 6.5–8.1)
Total Protein: 6 g/dL — ABNORMAL LOW (ref 6.5–8.1)

## 2023-02-24 LAB — T4, FREE: Free T4: 0.72 ng/dL (ref 0.61–1.12)

## 2023-02-24 LAB — CBC
HCT: 43.3 % (ref 36.0–46.0)
Hemoglobin: 14.9 g/dL (ref 12.0–15.0)
MCH: 34.5 pg — ABNORMAL HIGH (ref 26.0–34.0)
MCHC: 34.4 g/dL (ref 30.0–36.0)
MCV: 100.2 fL — ABNORMAL HIGH (ref 80.0–100.0)
Platelets: 256 10*3/uL (ref 150–400)
RBC: 4.32 MIL/uL (ref 3.87–5.11)
RDW: 13.4 % (ref 11.5–15.5)
WBC: 15.6 10*3/uL — ABNORMAL HIGH (ref 4.0–10.5)
nRBC: 0.4 % — ABNORMAL HIGH (ref 0.0–0.2)

## 2023-02-24 LAB — MAGNESIUM: Magnesium: 1.7 mg/dL (ref 1.7–2.4)

## 2023-02-24 LAB — TSH: TSH: 2.367 u[IU]/mL (ref 0.350–4.500)

## 2023-02-24 LAB — BRAIN NATRIURETIC PEPTIDE: B Natriuretic Peptide: 81.9 pg/mL (ref 0.0–100.0)

## 2023-02-24 LAB — CREATININE, SERUM
Creatinine, Ser: 1.9 mg/dL — ABNORMAL HIGH (ref 0.44–1.00)
GFR, Estimated: 29 mL/min — ABNORMAL LOW (ref >60)

## 2023-02-24 LAB — PHOSPHORUS: Phosphorus: 3.3 mg/dL (ref 2.5–4.6)

## 2023-02-24 LAB — LIPASE, BLOOD: Lipase: 37 U/L (ref 11–51)

## 2023-02-24 MED ORDER — SODIUM CHLORIDE 0.9% FLUSH
3.0000 mL | Freq: Two times a day (BID) | INTRAVENOUS | Status: DC
  Administered 2023-02-24 – 2023-02-26 (×4): 3 mL via INTRAVENOUS

## 2023-02-24 MED ORDER — ACETAMINOPHEN 325 MG PO TABS: 650.0000 mg | ORAL_TABLET | Freq: Four times a day (QID) | ORAL | Status: DC | PRN

## 2023-02-24 MED ORDER — QUETIAPINE FUMARATE 25 MG PO TABS
100.0000 mg | ORAL_TABLET | Freq: Every day | ORAL | Status: DC
  Administered 2023-02-24 – 2023-02-25 (×2): 100 mg via ORAL
  Filled 2023-02-24 (×2): qty 4

## 2023-02-24 MED ORDER — ACETAMINOPHEN 650 MG RE SUPP: 650.0000 mg | Freq: Four times a day (QID) | RECTAL | Status: DC | PRN

## 2023-02-24 MED ORDER — CHLORHEXIDINE GLUCONATE CLOTH 2 % EX PADS
6.0000 | MEDICATED_PAD | Freq: Every day | CUTANEOUS | Status: DC
  Administered 2023-02-25 – 2023-02-26 (×2): 6 via TOPICAL

## 2023-02-24 MED ORDER — SODIUM CHLORIDE 0.9 % IV SOLN: 250.0000 mL | INTRAVENOUS | Status: DC | PRN

## 2023-02-24 MED ORDER — SERTRALINE HCL 50 MG PO TABS
100.0000 mg | ORAL_TABLET | Freq: Every day | ORAL | Status: DC
  Administered 2023-02-25 – 2023-02-26 (×2): 100 mg via ORAL
  Filled 2023-02-24 (×2): qty 2

## 2023-02-24 MED ORDER — POTASSIUM CHLORIDE CRYS ER 20 MEQ PO TBCR
40.0000 meq | EXTENDED_RELEASE_TABLET | Freq: Once | ORAL | Status: AC
  Administered 2023-02-24: 40 meq via ORAL
  Filled 2023-02-24: qty 2

## 2023-02-24 MED ORDER — SODIUM CHLORIDE 0.9% FLUSH: 3.0000 mL | INTRAVENOUS | Status: DC | PRN

## 2023-02-24 MED ORDER — METOPROLOL TARTRATE 5 MG/5ML IV SOLN: 2.5000 mg | Freq: Four times a day (QID) | INTRAVENOUS | Status: DC | PRN

## 2023-02-24 MED ORDER — CARVEDILOL 6.25 MG PO TABS: 6.2500 mg | ORAL_TABLET | Freq: Two times a day (BID) | ORAL | 3 refills | Status: DC

## 2023-02-24 MED ORDER — HEPARIN SODIUM (PORCINE) 5000 UNIT/ML IJ SOLN
5000.0000 [IU] | Freq: Three times a day (TID) | INTRAMUSCULAR | Status: DC
  Administered 2023-02-24 – 2023-02-26 (×6): 5000 [IU] via SUBCUTANEOUS
  Filled 2023-02-24 (×6): qty 1

## 2023-02-24 MED ORDER — MAGNESIUM SULFATE 2 GM/50ML IV SOLN
2.0000 g | Freq: Once | INTRAVENOUS | Status: AC
  Administered 2023-02-24: 2 g via INTRAVENOUS
  Filled 2023-02-24: qty 50

## 2023-02-24 MED ORDER — POTASSIUM CHLORIDE 10 MEQ/100ML IV SOLN
10.0000 meq | INTRAVENOUS | Status: AC
  Administered 2023-02-24 (×3): 10 meq via INTRAVENOUS
  Filled 2023-02-24 (×3): qty 100

## 2023-02-24 MED ORDER — SODIUM CHLORIDE 0.9% FLUSH
3.0000 mL | Freq: Two times a day (BID) | INTRAVENOUS | Status: DC
  Administered 2023-02-24 – 2023-02-26 (×3): 3 mL via INTRAVENOUS

## 2023-02-24 MED ORDER — NICOTINE 21 MG/24HR TD PT24
21.0000 mg | MEDICATED_PATCH | Freq: Every day | TRANSDERMAL | Status: DC
  Administered 2023-02-24 – 2023-02-25 (×2): 21 mg via TRANSDERMAL
  Filled 2023-02-24 (×2): qty 1

## 2023-02-24 MED ORDER — HYDROCODONE-ACETAMINOPHEN 5-325 MG PO TABS
1.0000 | ORAL_TABLET | ORAL | Status: DC | PRN
  Administered 2023-02-24 – 2023-02-26 (×5): 1 via ORAL
  Filled 2023-02-24 (×5): qty 1

## 2023-02-24 MED ORDER — CARVEDILOL 3.125 MG PO TABS
6.2500 mg | ORAL_TABLET | Freq: Two times a day (BID) | ORAL | Status: DC
  Administered 2023-02-25 – 2023-02-26 (×3): 6.25 mg via ORAL
  Filled 2023-02-24 (×2): qty 2
  Filled 2023-02-24: qty 1

## 2023-02-24 NOTE — ED Triage Notes (Signed)
Pt sts that her PCP called her and said that she needed to come to the ED due to her potassium being really low.

## 2023-02-24 NOTE — ED Notes (Signed)
Pt K gtt titrated down in multiple different increments r/t burning. MD at the bedside and Ok to pause to give a break and also they are to speak with pharmacy on solutions for burning.

## 2023-02-24 NOTE — Telephone Encounter (Signed)
The lab reported a critical potassium value of less than 2. Dr.Sabharwal aware he asked me to contact pt and advise her to take her PO potassium and to report to the emergency room. I called pt and emergency contact no answer. Left vm for return call. Velia Moreno,RN called the pt no answer and then she called the emergency contact. She advised him to take her to the emergency room. He will contact the office when he is on his way to the emergency room.

## 2023-02-24 NOTE — ED Notes (Signed)
Lab called and stating blood work was sent and will be in process

## 2023-02-24 NOTE — Telephone Encounter (Signed)
Pt called stating she is on the way to the emergency room.

## 2023-02-24 NOTE — H&P (Signed)
History and Physical    Chief Complaint: Generalized weakness.     HISTORY OF PRESENT ILLNESS: Emma Gould is an 67 y.o. female seen in the emergency room today for generalized weakness. Patient was seen by her cardiologist for hospital follow-up appointment for hospitalization she had on October 30, 2022 and was discharged.  Patient was seen by cardiologist today and was advised to come to the ER after reviewing her blood work from earlier today showing severe hypokalemia.  Patient states that she does not feel so weak and is not able to walk when she stops taking her potassium because it makes her feel nauseous.   Pt has  Past Medical History:  Diagnosis Date   Anxiety    CHF (congestive heart failure) (HCC)    CKD (chronic kidney disease)    Depression    Fibromyalgia    Hypertension      Review of Systems  Constitutional:  Positive for fatigue.  Neurological:  Positive for weakness.  All other systems reviewed and are negative.  No Known Allergies Past Surgical History:  Procedure Laterality Date   CERVICAL FUSION     RIGHT/LEFT HEART CATH AND CORONARY ANGIOGRAPHY Bilateral 01/26/2023   Procedure: RIGHT/LEFT HEART CATH AND CORONARY ANGIOGRAPHY;  Surgeon: Jolaine Artist, MD;  Location: [facility];  Service: Cardiovascular;  Laterality: Bilateral;       MEDICATIONS: Current Outpatient Medications  Medication Instructions   acetaminophen (TYLENOL) 650 mg, Oral, Every 6 hours PRN   albuterol (VENTOLIN HFA) 108 (90 Base) MCG/ACT inhaler 2 puffs, Inhalation, 4 times daily PRN   carvedilol (COREG) 6.25 mg, Oral, 2 times daily with meals   empagliflozin (JARDIANCE) 10 mg, Oral, Daily before breakfast   Eszopiclone 3 mg, Oral, Daily at bedtime   ezetimibe (ZETIA) 10 mg, Oral, Daily   FUROSCIX 80 MG/10ML CTKT Inject into the skin.   metolazone (ZAROXOLYN) 2.5 mg, Oral, As directed, Take for two days only.   omeprazole (PRILOSEC) 40 mg, Oral, Daily    ondansetron (ZOFRAN-ODT) 8 MG disintegrating tablet Oral   oxyCODONE-acetaminophen (PERCOCET) 10-325 MG tablet 1 tablet, Oral, 4 times daily PRN   potassium chloride SA (KLOR-CON M) 20 MEQ tablet 2 tablets BID   QUEtiapine (SEROQUEL) 25 mg, Oral, Daily at bedtime   sertraline (ZOLOFT) 50 mg, Oral, Daily   spironolactone (ALDACTONE) 12.5 mg, Oral, Daily   SUMAtriptan (IMITREX) 50 mg, As needed   topiramate (TOPAMAX) 50 mg, Oral, Daily PRN   torsemide (DEMADEX) 20 mg, Oral, Daily    carvedilol  6.25 mg Oral BID WC   Chlorhexidine Gluconate Cloth  6 each Topical Daily   heparin  5,000 Units Subcutaneous Q8H   nicotine  21 mg Transdermal Daily   QUEtiapine  100 mg Oral QHS   sertraline  100 mg Oral Daily   sodium chloride flush  3 mL Intravenous Q12H   sodium chloride flush  3 mL Intravenous Q12H     sodium chloride     potassium chloride     ED Course: Pt in Ed patient is alert and gives history she is afebrile with O2 sats of 99/ 98% on room air.  Vitals:   02/24/23 1900 02/24/23 2000 02/24/23 2215 02/24/23 2300  BP: (!) 141/71 (!) 104/90 135/82 137/65  Pulse: 64 71 69 71  Resp: '12 19 14 16  '$ Temp:   98.6 F (37 C)   TempSrc:   Oral   SpO2: 96% 97% 98% 97%  Weight:  No intake/output data recorded. SpO2: 97 % Blood work in ed shows: Magnesium of 1.7 white count of 15.6 MCV of 100.2 platelets normal at 256, hyponatremia of 131 and chloride of 87, potassium less than 2, glucose 122, serum creatinine of 2.09, anion gap of 19, AST ALT within normal limits.  Results for orders placed or performed during the hospital encounter of 02/24/23 (from the past 24 hour(s))  Lipase, blood     Status: None   Collection Time: 02/24/23 11:14 AM  Result Value Ref Range   Lipase 37 11 - 51 U/L  Phosphorus     Status: None   Collection Time: 02/24/23 11:14 AM  Result Value Ref Range   Phosphorus 3.3 2.5 - 4.6 mg/dL  Magnesium     Status: None   Collection Time: 02/24/23  5:02 PM  Result  Value Ref Range   Magnesium 1.7 1.7 - 2.4 mg/dL  TSH     Status: None   Collection Time: 02/24/23  5:02 PM  Result Value Ref Range   TSH 2.367 0.350 - 4.500 uIU/mL  T4, free     Status: None   Collection Time: 02/24/23  5:02 PM  Result Value Ref Range   Free T4 0.72 0.61 - 1.12 ng/dL  CBC     Status: Abnormal   Collection Time: 02/24/23  5:03 PM  Result Value Ref Range   WBC 15.6 (H) 4.0 - 10.5 K/uL   RBC 4.32 3.87 - 5.11 MIL/uL   Hemoglobin 14.9 12.0 - 15.0 g/dL   HCT 43.3 36.0 - 46.0 %   MCV 100.2 (H) 80.0 - 100.0 fL   MCH 34.5 (H) 26.0 - 34.0 pg   MCHC 34.4 30.0 - 36.0 g/dL   RDW 13.4 11.5 - 15.5 %   Platelets 256 150 - 400 K/uL   nRBC 0.4 (H) 0.0 - 0.2 %  Comprehensive metabolic panel     Status: Abnormal   Collection Time: 02/24/23  5:03 PM  Result Value Ref Range   Sodium 131 (L) 135 - 145 mmol/L   Potassium <2.0 (LL) 3.5 - 5.1 mmol/L   Chloride 87 (L) 98 - 111 mmol/L   CO2 25 22 - 32 mmol/L   Glucose, Bld 122 (H) 70 - 99 mg/dL   BUN 34 (H) 8 - 23 mg/dL   Creatinine, Ser 2.09 (H) 0.44 - 1.00 mg/dL   Calcium 8.3 (L) 8.9 - 10.3 mg/dL   Total Protein 6.0 (L) 6.5 - 8.1 g/dL   Albumin 2.9 (L) 3.5 - 5.0 g/dL   AST 20 15 - 41 U/L   ALT 12 0 - 44 U/L   Alkaline Phosphatase 73 38 - 126 U/L   Total Bilirubin 0.5 0.3 - 1.2 mg/dL   GFR, Estimated 26 (L) >60 mL/min   Anion gap 19 (H) 5 - 15  Lactic acid, plasma     Status: None   Collection Time: 02/24/23  8:29 PM  Result Value Ref Range   Lactic Acid, Venous 0.9 0.5 - 1.9 mmol/L  Creatinine, serum     Status: Abnormal   Collection Time: 02/24/23  8:29 PM  Result Value Ref Range   Creatinine, Ser 1.90 (H) 0.44 - 1.00 mg/dL   GFR, Estimated 29 (L) >60 mL/min  MRSA Next Gen by PCR, Nasal     Status: None   Collection Time: 02/24/23 10:42 PM   Specimen: Nasal Swab  Result Value Ref Range   MRSA by PCR Next Gen NOT DETECTED  NOT DETECTED  Lactic acid, plasma     Status: Abnormal   Collection Time: 02/24/23 11:02 PM   Result Value Ref Range   Lactic Acid, Venous 3.1 (HH) 0.5 - 1.9 mmol/L  Basic metabolic panel     Status: Abnormal   Collection Time: 02/25/23 12:43 AM  Result Value Ref Range   Sodium 134 (L) 135 - 145 mmol/L   Potassium 3.3 (L) 3.5 - 5.1 mmol/L   Chloride 92 (L) 98 - 111 mmol/L   CO2 27 22 - 32 mmol/L   Glucose, Bld 117 (H) 70 - 99 mg/dL   BUN 31 (H) 8 - 23 mg/dL   Creatinine, Ser 1.85 (H) 0.44 - 1.00 mg/dL   Calcium 8.4 (L) 8.9 - 10.3 mg/dL   GFR, Estimated 30 (L) >60 mL/min   Anion gap 15 5 - 15    Unresulted Labs (From admission, onward)     Start     Ordered   02/25/23 0500  Comprehensive metabolic panel  Tomorrow morning,   STAT        02/24/23 2007   02/25/23 0500  CBC  Tomorrow morning,   STAT        02/24/23 2007   02/25/23 0500  Magnesium  Tomorrow morning,   STAT        02/24/23 2007   02/25/23 0500  Phosphorus  Tomorrow morning,   STAT        02/24/23 2007   02/24/23 2240  MRSA culture  Once,   R        02/24/23 2240   02/24/23 2005  HIV Antibody (routine testing w rflx)  (HIV Antibody (Routine testing w reflex) panel)  Once,   URGENT        02/24/23 2007   02/24/23 1756  Urinalysis, Routine w reflex microscopic -Urine, Clean Catch  Once,   URGENT       Question:  Specimen Source  Answer:  Urine, Clean Catch   02/24/23 1756           Pt has received : Orders Placed This Encounter  Procedures   Critical Care    This order was created via procedure documentation    Standing Status:   Standing    Number of Occurrences:   1   MRSA Next Gen by PCR, Nasal    Standing Status:   Standing    Number of Occurrences:   1   MRSA culture    Standing Status:   Standing    Number of Occurrences:   1   US Abdomen Limited RUQ (LIVER/GB)    Standing Status:   Standing    Number of Occurrences:   1    Order Specific Question:   Symptom/Reason for Exam    Answer:   RUQ pain [id number]   CBC    Standing Status:   Standing    Number of Occurrences:   1    Comprehensive metabolic panel    Standing Status:   Standing    Number of Occurrences:   1   Magnesium    Standing Status:   Standing    Number of Occurrences:   1   Lipase, blood    Standing Status:   Standing    Number of Occurrences:   1   Urinalysis, Routine w reflex microscopic -Urine, Clean Catch    Standing Status:   Standing    Number of Occurrences:   1    Order Specific  Question:   Specimen Source    Answer:   Urine, Clean Catch [76]   Phosphorus    Standing Status:   Standing    Number of Occurrences:   1   Lactic acid, plasma    Standing Status:   Standing    Number of Occurrences:   2   TSH    Standing Status:   Standing    Number of Occurrences:   1   T4, free    Standing Status:   Standing    Number of Occurrences:   1   Creatinine, serum    Baseline for heparin therapy IF NOT ALREADY DRAWN.    Standing Status:   Standing    Number of Occurrences:   1   HIV Antibody (routine testing w rflx)    Standing Status:   Standing    Number of Occurrences:   1   Comprehensive metabolic panel    Standing Status:   Standing    Number of Occurrences:   1   CBC    Standing Status:   Standing    Number of Occurrences:   1   Magnesium    Standing Status:   Standing    Number of Occurrences:   1   Phosphorus    Standing Status:   Standing    Number of Occurrences:   1   Basic metabolic panel    Standing Status:   Standing    Number of Occurrences:   1   Diet Heart Room service appropriate? Yes; Fluid consistency: Thin    Standing Status:   Standing    Number of Occurrences:   1    Order Specific Question:   Room service appropriate?    Answer:   Yes    Order Specific Question:   Fluid consistency:    Answer:   Thin   Maintain IV access    Standing Status:   Standing    Number of Occurrences:   1   Vital signs    Standing Status:   Standing    Number of Occurrences:   1   Notify physician (specify)    Standing Status:   Standing    Number of Occurrences:    20    Order Specific Question:   Notify Physician    Answer:   for pulse less than 55 or greater than 120    Order Specific Question:   Notify Physician    Answer:   for respiratory rate less than 12 or greater than 25    Order Specific Question:   Notify Physician    Answer:   for temperature greater than 100.5 F    Order Specific Question:   Notify Physician    Answer:   for urinary output less than 30 mL/hr for four hours    Order Specific Question:   Notify Physician    Answer:   for systolic BP less than 90 or greater than 0000000, diastolic BP less than 60 or greater than 100    Order Specific Question:   Notify Physician    Answer:   for new hypoxia w/ oxygen saturations < 88%   Progressive Mobility Protocol: No Restrictions    Standing Status:   Standing    Number of Occurrences:   1   Daily weights    Standing Status:   Standing    Number of Occurrences:   1   Intake and Output    Standing  Status:   Standing    Number of Occurrences:   1   Initiate Oral Care Protocol    Standing Status:   Standing    Number of Occurrences:   1   Initiate Carrier Fluid Protocol    Standing Status:   Standing    Number of Occurrences:   1   RN may order General Admission PRN Orders utilizing "General Admission PRN medications" (through manage orders) for the following patient needs: allergy symptoms (Claritin), cold sores (Carmex), cough (Robitussin DM), eye irritation (Liquifilm Tears), hemorrhoids (Tucks), indigestion (Maalox), minor skin irritation (Hydrocortisone Cream), muscle pain Suezanne Jacquet Gay), nose irritation (saline nasal spray) and sore throat (Chloraseptic spray).    Standing Status:   Standing    Number of Occurrences:   [phone]   Cardiac Monitoring - Continuous Indefinite    Standing Status:   Standing    Number of Occurrences:   1    Order Specific Question:   Indications for use:    Answer:   Severe hypokalemia or hypomagnesemia   Refer to Sidebar Report - CHG cloths Sidebar    - CHG  cloths Sidebar    Standing Status:   Standing    Number of Occurrences:   1   Patient education (specify): - [facility] Daily CHG Bathing    Standing Status:   Standing    Number of Occurrences:   1    Order Specific Question:   Specify    Answer:   - [facility] Daily CHG Bathing   Full code    Standing Status:   Standing    Number of Occurrences:   1    Order Specific Question:   By:    Answer:   Other   Consult to hospitalist    Standing Status:   Standing    Number of Occurrences:   1    Order Specific Question:   Place call to:    Answer:   [id number]    Order Specific Question:   Reason for Consult    Answer:   Admit   pharmacy consult    Standing Status:   Standing    Number of Occurrences:   1    Order Specific Question:   Reason for Consult (*indicate specifics in comment field)    Answer:   Other (see comment)    Order Specific Question:   Comment:    Answer:   electrolyte replacement and management   Pulse oximetry check with vital signs    Standing Status:   Standing    Number of Occurrences:   1   Oxygen therapy Mode or (Route): Nasal cannula; Liters Per Minute: 2; Keep 02 saturation: greater than 92 %    Standing Status:   Standing    Number of Occurrences:   20    Order Specific Question:   Mode or (Route)    Answer:   Nasal cannula    Order Specific Question:   Liters Per Minute    Answer:   2    Order Specific Question:   Keep 02 saturation    Answer:   greater than 92 %   ED EKG    Standing Status:   Standing    Number of Occurrences:   1    Order Specific Question:   Reason for Exam    Answer:   Other (See Comments)   Admit to Inpatient (patient's expected length of stay will be greater than 2 midnights or inpatient  only procedure)    Standing Status:   Standing    Number of Occurrences:   1    Order Specific Question:   Hospital Area    Answer:   [place] [[id number]]    Order Specific Question:   Level of Care    Answer:   Stepdown [14]     Order Specific Question:   Covid Evaluation    Answer:   Asymptomatic - no recent exposure (last 10 days) testing not required    Order Specific Question:   Diagnosis    Answer:   Diuretic-induced hypokalemia [id number]    Order Specific Question:   Admitting Physician    Answer:   Cherylann Ratel    Order Specific Question:   Attending Physician    Answer:   Cherylann Ratel    Order Specific Question:   Bed request comments    Answer:   severe hypokalemia.    Order Specific Question:   Certification:    Answer:   I certify this patient will need inpatient services for at least 2 midnights    Order Specific Question:   Estimated Length of Stay    Answer:   3   Aspiration precautions    Standing Status:   Standing    Number of Occurrences:   1   Fall precautions    Standing Status:   Standing    Number of Occurrences:   1    Meds ordered this encounter  Medications   potassium chloride SA (KLOR-CON M) CR tablet 40 mEq   potassium chloride 10 mEq in 100 mL IVPB   magnesium sulfate IVPB 2 g 50 mL   carvedilol (COREG) tablet 6.25 mg   QUEtiapine (SEROQUEL) tablet 100 mg   sertraline (ZOLOFT) tablet 100 mg   heparin injection 5,000 Units   sodium chloride flush (NS) 0.9 % injection 3 mL   OR Linked Order Group    acetaminophen (TYLENOL) tablet 650 mg    acetaminophen (TYLENOL) suppository 650 mg   HYDROcodone-acetaminophen (NORCO/VICODIN) 5-325 MG per tablet 1 tablet   sodium chloride flush (NS) 0.9 % injection 3 mL   sodium chloride flush (NS) 0.9 % injection 3 mL   0.9 %  sodium chloride infusion   nicotine (NICODERM CQ - dosed in mg/24 hours) patch 21 mg   metoprolol tartrate (LOPRESSOR) injection 2.5 mg   Chlorhexidine Gluconate Cloth 2 % PADS 6 each   potassium chloride 10 mEq in 100 mL IVPB     Admission Imaging : US Abdomen Limited RUQ (LIVER/GB)  Result Date: 02/24/2023 CLINICAL DATA:  Right upper quadrant abdominal pain EXAM: ULTRASOUND ABDOMEN LIMITED  RIGHT UPPER QUADRANT COMPARISON:  None Available. FINDINGS: Gallbladder: The gallbladder is mildly distended, however, no intraluminal stones or sludge, gallbladder wall thickening, or pericholecystic fluid is identified. The sonographic Percell Miller sign is reportedly negative. Common bile duct: Diameter: 7 mm in mid diameter, at the upper limits of normal for age Liver: No focal lesion identified. Within normal limits in parenchymal echogenicity. Portal vein is patent on color Doppler imaging with normal direction of blood flow towards the liver. Other: None. IMPRESSION: 1. Normal right upper quadrant sonogram. Electronically Signed   By: Fidela Salisbury M.D.   On: 02/24/2023 18:49   Physical Examination: Vitals:   02/24/23 1900 02/24/23 2000 02/24/23 2215 02/24/23 2300  BP: (!) 141/71 (!) 104/90 135/82 Comment: Simultaneous filing. User may not have seen previous data. 137/65  Pulse:  64 71 69 Comment: Simultaneous filing. User may not have seen previous data. 71  Temp:   98.6 F (37 C)   Resp: '12 19 14 '$ Comment: Simultaneous filing. User may not have seen previous data. 16  Weight:      SpO2: 96% 97% 98% Comment: Simultaneous filing. User may not have seen previous data. 97%  TempSrc:   Oral   BMI (Calculated):       Physical Exam Vitals and nursing note reviewed.  Constitutional:      General: She is not in acute distress.    Appearance: She is obese. She is not ill-appearing, toxic-appearing or diaphoretic.  HENT:     Head: Normocephalic and atraumatic.     Right Ear: Hearing and external ear normal.     Left Ear: Hearing and external ear normal.     Nose: Nose normal. No nasal deformity.     Mouth/Throat:     Lips: Pink.     Mouth: Mucous membranes are moist.     Tongue: No lesions.     Pharynx: Oropharynx is clear.  Eyes:     Extraocular Movements: Extraocular movements intact.  Cardiovascular:     Rate and Rhythm: Normal rate and regular rhythm.     Pulses: Normal pulses.      Heart sounds: Normal heart sounds.  Pulmonary:     Effort: Pulmonary effort is normal.     Breath sounds: Normal breath sounds.  Abdominal:     General: Bowel sounds are normal. There is no distension.     Palpations: Abdomen is soft. There is no mass.     Tenderness: There is no abdominal tenderness. There is no guarding.     Hernia: No hernia is present.  Musculoskeletal:     Right lower leg: No edema.     Left lower leg: No edema.  Skin:    General: Skin is warm.  Neurological:     General: No focal deficit present.     Mental Status: She is alert and oriented to person, place, and time.     Cranial Nerves: Cranial nerves 2-12 are intact.     Motor: Motor function is intact.  Psychiatric:        Attention and Perception: Attention normal.        Mood and Affect: Mood normal.        Speech: Speech normal.        Behavior: Behavior normal. Behavior is cooperative.        Cognition and Memory: Cognition normal.     Assessment and Plan: * Diuretic-induced hypokalemia    Latest Ref Rng & Units 02/25/2023   12:43 AM 02/24/2023    8:29 PM 02/24/2023    5:03 PM  BMP  Glucose 70 - 99 mg/dL 117   122   BUN 8 - 23 mg/dL 31   34   Creatinine 0.44 - 1.00 mg/dL 1.85  1.90  2.09   Sodium 135 - 145 mmol/L 134   131   Potassium 3.5 - 5.1 mmol/L 3.3   <2.0   Chloride 98 - 111 mmol/L 92   87   CO2 22 - 32 mmol/L 27   25   Calcium 8.9 - 10.3 mg/dL 8.4   8.3   Will replace and seek pharmacy consult for electrolyte monitoring.   Generalized weakness Once electrolytes are repleted. Patient will need aggressive physical therapy and possibly short-term rehab.  Coronary artery disease involving native coronary artery of  native heart with angina pectoris [facility]) Patient had normal cardiac cath that shows mild obstructive CAD. Currently chest pain-free. Will continue patient on Coreg, metoprolol.  Acute renal failure superimposed on stage 3a chronic kidney disease (HCC)    Latest Ref Rng &  Units 02/25/2023   12:43 AM 02/24/2023    8:29 PM 02/24/2023    5:03 PM  BMP  Glucose 70 - 99 mg/dL 117   122   BUN 8 - 23 mg/dL 31   34   Creatinine 0.44 - 1.00 mg/dL 1.85  1.90  2.09   Sodium 135 - 145 mmol/L 134   131   Potassium 3.5 - 5.1 mmol/L 3.3   <2.0   Chloride 98 - 111 mmol/L 92   87   CO2 22 - 32 mmol/L 27   25   Calcium 8.9 - 10.3 mg/dL 8.4   8.3   Avoid nephrotoxic meds. Renally dose meds.    GAD (generalized anxiety disorder) Continue sertraline once med rec is available.   GERD (gastroesophageal reflux disease) IV PPI therapy.   Hypertension Vitals:   02/24/23 1657 02/24/23 1730 02/24/23 1800 02/24/23 1830  BP: 112/72 134/71 (!) 143/76 137/67   02/24/23 1900 02/24/23 2000 02/24/23 2215 02/24/23 2300  BP: (!) 141/71 (!) 104/90 135/82 137/65  Patient continued on Coreg with as needed Lopressor.   Tobacco abuse Nicotine patch.    DVT prophylaxis:  Heparin   Code Status:  Full code   Family Communication:  Blankenship,William (Significant other)  [phone].  Disposition Plan:  Home.    Consults called:  None.  Admission status: Inpatient.    Unit/ Expected LOS: Telemetry / 3 days.    Para Skeans MD [facility]  6 PM- 2 AM. Please contact me via secure Chat 6 PM-2 AM. [phone]( Pager ) To contact the [facility] Attending or Consulting provider [place] or covering provider during after hours [place], for this patient.   Check the care team in [facility] and look for a) attending/consulting [facility] provider listed and b) the [facility] team listed Log into [email or web address] and use [place]'s universal password to access. If you do not have the password, please contact the hospital operator. Locate the [facility] provider you are looking for under [facility] and page to a number that you can be directly reached. If you still have difficulty reaching the provider, please page the [facility] (Director on Call) for the Hospitalists listed on amion for  assistance. [email or web address] 02/25/2023, 1:08 AM

## 2023-02-24 NOTE — Addendum Note (Signed)
Addended by: Harvie Junior on: 02/24/2023 11:45 AM   Modules accepted: Orders

## 2023-02-24 NOTE — Patient Instructions (Signed)
DECREASE Coreg to 6.'25mg'$  twice daily  Routine lab work today. Will notify you of abnormal results  Your provider requested you have an ultrasound for gallstones. (They will contact you to schedule an appointment)  Your provider requests you have PFTs (pulmonary functions tests)  Follow up in 3 months.   Do the following things EVERYDAY: Weigh yourself in the morning before breakfast. Write it down and keep it in a log. Take your medicines as prescribed Eat low salt foods--Limit salt (sodium) to 2000 mg per day.  Stay as active as you can everyday Limit all fluids for the day to less than 2 liters

## 2023-02-24 NOTE — Consult Note (Signed)
PHARMACY CONSULT NOTE - ELECTROLYTES  Pharmacy Consult for Electrolyte Monitoring and Replacement   Recent Labs: Potassium (mmol/L)  Date Value  02/24/2023 <2.0 (LL)   Magnesium (mg/dL)  Date Value  02/24/2023 1.7   Calcium (mg/dL)  Date Value  02/24/2023 8.3 (L)   Albumin (g/dL)  Date Value  02/24/2023 2.9 (L)   Phosphorus (mg/dL)  Date Value  02/24/2023 3.3   Sodium (mmol/L)  Date Value  02/24/2023 131 (L)   Corrected Ca: 9.2 mg/dL  Assessment  Emma Gould is a 67 y.o. female presenting with abnormal labs. PMH significant for HTN, HLD, CKD, CHF. Pharmacy has been consulted to monitor and replace electrolytes.  Diet: Regular  Goal of Therapy: Electrolytes WNL  Plan:  Potassium: <2.0 x2, give KCl 40 mEq PO x1 and KCl 10 mEq IV Q1H x4 Magnesium: 1.7, no replacement needed Phosphorus: 3.3, no replacement needed Sodium: 131, continue to monitor Chloride: 87, continue to monitor Check BMP at 0000 Check CMP, Mg, Phos with AM labs  Thank you for allowing pharmacy to be a part of this patient's care.  Gretel Acre, PharmD PGY1 Pharmacy Resident 02/24/2023 8:37 PM

## 2023-02-24 NOTE — ED Notes (Signed)
Report to Pass Christian, RN completed.

## 2023-02-24 NOTE — Progress Notes (Signed)
ADVANCED HF CLINIC NOTE  Referring Physician: Darylene Price, NP Primary Care:Silva Ocie Bob, MD Primary Cardiologist: None   HPI:  Emma Gould is a 67 y/o female with HTN, anxiety, depression, fibromyalgia, tobacco use and chronic diastolic heart failure referred by Darylene Price for further evalaution of her HF, .    Seen in ED 11/22/22 due to swelling in her face, hands, and bilateral legs. Hadn't been taking her coreg correctly or her HCTZ at all. Refused oral lasix. She was released. Admitted 10/30/22 due to generalized weakness. She states that she has been having left-sided> right-sided abdominal pain for at least the last 2 weeks. Started on ceftriaxone and metronidazole and IV fluids. GI consult obtained. CT abd-moderate diffuse colonic wall thickening in the transverse, descending, sigmoid colon with pericolonic stranding.  Lokelma given for hyperkalemia. Discharged after 2 days.    Seen by Emma. Hackney in [facility] for first visit on 11/26/22. Had 2+ edema. Furoscix prescribed and echo ordered. Had good response to Furoscix initially but then stopped working. Now with recurrent LE edema. Says it is hard to walk. + exertional dyspnea. No CP. Still smoking almost 1ppd  Echo 12/10/22 EF 123456 Normal diastolic function. RV normal. Mild to moderate TR RVSP 62mHg  she has undergone a RHC w/ RA 14, PCW 19, CI 2.9, PA mean of 24.   Interval hx:  Today she has a variety of complaints. She reports feeling very "shaky with bad nerves". She also reports constant nausea and decreased appetite. She continues to smoke 1/2PPD. She can walk around her 'double wide trailer' two times with some shortness of breath towards the end. She is currently taking torsemide '20mg'$  daily. Reports RUQ pain after meals.   Past Medical History:  Diagnosis Date   Anxiety    CHF (congestive heart failure) (HCC)    CKD (chronic kidney disease)    Depression    Fibromyalgia    Hypertension     Current  Outpatient Medications  Medication Sig Dispense Refill   acetaminophen (TYLENOL) 325 MG tablet Take 2 tablets (650 mg total) by mouth every 6 (six) hours as needed for mild pain or headache (fever >/= 101).     albuterol (VENTOLIN HFA) 108 (90 Base) MCG/ACT inhaler Inhale 2 puffs into the lungs 4 (four) times daily as needed for shortness of breath.     carvedilol (COREG) 12.5 MG tablet Take 1 tablet (12.5 mg total) by mouth 2 (two) times daily with a meal. 60 tablet 3   Eszopiclone 3 MG TABS Take 3 mg by mouth at bedtime.     ezetimibe (ZETIA) 10 MG tablet Take 10 mg by mouth daily.     omeprazole (PRILOSEC) 40 MG capsule Take 40 mg by mouth daily.     ondansetron (ZOFRAN-ODT) 8 MG disintegrating tablet Take by mouth.     oxyCODONE-acetaminophen (PERCOCET) 10-325 MG tablet Take 1 tablet by mouth 4 (four) times daily as needed for pain.     QUEtiapine (SEROQUEL) 25 MG tablet Take 1 tablet (25 mg total) by mouth at bedtime. (Patient taking differently: Take 100 mg by mouth at bedtime.) 30 tablet 0   sertraline (ZOLOFT) 50 MG tablet Take 50 mg by mouth daily.     topiramate (TOPAMAX) 50 MG tablet Take 50 mg by mouth daily as needed (migraine).     torsemide (DEMADEX) 20 MG tablet Take 1 tablet (20 mg total) by mouth daily. 90 tablet 3   empagliflozin (JARDIANCE) 10 MG TABS tablet  Take 1 tablet (10 mg total) by mouth daily before breakfast. 30 tablet 6   FUROSCIX 80 MG/10ML CTKT Inject into the skin. (Patient not taking: Reported on 02/24/2023)     metolazone (ZAROXOLYN) 2.5 MG tablet Take 1 tablet (2.5 mg total) by mouth as directed. Take for two days only. (Patient not taking: Reported on 02/24/2023) 2 tablet 0   potassium chloride SA (KLOR-CON M) 20 MEQ tablet 2 tablets BID (Patient not taking: Reported on 02/24/2023) 70 tablet 5   spironolactone (ALDACTONE) 25 MG tablet Take 0.5 tablets (12.5 mg total) by mouth daily. (Patient not taking: Reported on 02/24/2023) 15 tablet 3   SUMAtriptan (IMITREX) 50  MG tablet Take 50 mg by mouth as needed for migraine. Take a second tablet 2 hours later if needed; max '200mg'$ /day (Patient not taking: Reported on 02/24/2023)     No current facility-administered medications for this visit.    No Known Allergies    Social History   Socioeconomic History   Marital status: Widowed    Spouse name: Not on file   Number of children: Not on file   Years of education: Not on file   Highest education level: Not on file  Occupational History   Not on file  Tobacco Use   Smoking status: Every Day    Packs/day: 0.50    Years: 45.00    Total pack years: 22.50    Types: Cigarettes   Smokeless tobacco: Never  Substance and Sexual Activity   Alcohol use: Never   Drug use: Never   Sexual activity: Not on file  Other Topics Concern   Not on file  Social History Narrative   Not on file   Social Determinants of Health   Financial Resource Strain: Not on file  Food Insecurity: No Food Insecurity (10/30/2022)   Hunger Vital Sign    Worried About Running Out of Food in the Last Year: Never true    Ran Out of Food in the Last Year: Never true  Transportation Needs: No Transportation Needs (10/30/2022)   PRAPARE - Hydrologist (Medical): No    Lack of Transportation (Non-Medical): No  Physical Activity: Not on file  Stress: Not on file  Social Connections: Not on file  Intimate Partner Violence: Not At Risk (10/30/2022)   Humiliation, Afraid, Rape, and Kick questionnaire    Fear of Current or Ex-Partner: No    Emotionally Abused: No    Physically Abused: No    Sexually Abused: No      Family History  Problem Relation Age of Onset   Stroke Mother    Hypertension Mother    Lung cancer Father        Father died of lung cancer    Vitals:   02/24/23 1008  BP: 126/78  Pulse: 73  Resp: 18  SpO2: 100%  Weight: 141 lb (64 kg)  Height: '5\' 3"'$  (1.6 m)     PHYSICAL EXAM: General:  sitting in chair; chronically ill  appearing.  HEENT: normal Neck: supple. no JVD. Carotids 2+ bilat; no bruits. No lymphadenopathy or thryomegaly appreciated. Cor: PMI nondisplaced. Regular rate & rhythm. No rubs, gallops or murmurs. Lungs: decreased throughout  Abdomen: Mild RUQ tenderness Extremities: no cyanosis, clubbing, No edema.  Neuro: alert & orientedx3, cranial nerves grossly intact. moves all 4 extremities w/o difficulty. Affect pleasant  RHC/LHC: 01/26/23  Ao = 144/74 (104) LV = 148/18 RA =  14 RV = 38/14 PA =  35/17 (24) PCW = 19 Fick cardiac output/index = 5.0/2.9 PVR = 1.0 FA sat = 95% PA sat = 64%, 66% PAPi = 1.3   Assessment: 1. 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 LVEF 60-65% with mildly elevated LVEDP 3. 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 Normal cardiac output    ASSESSMENT & PLAN:  1. Persistent LE edema, dyspnea and chest pressure - Echo 12/10/22 EF 123456 Normal diastolic function. RV normal. Mild to moderate TR RVSP 64mHg - Continue spiro & Jardiance - Stressed need for compression hose - RHC above consistent w/ suboptimal RV function, RA 14, PCW 19, CI 2.9, PVR 1.  - Decrease coreg to 6.'25mg'$  BID; patient has HFpEF, will attempt to reduce BB.  - Previously patient had 3+ pitting edema to the knees; aggressively diuresed with AKI afterwards. Will repeat labs today. On exam, she is now euvolemic but continues to complain of fatigue that I believe is unrelated to her HFpEF and rather secondary to chronic deconditioning, tobacco use. We discussed regular exercise at length today.  - Reporting persistent nausea; will obtain RUQ U/S to evaluate for gallstones. CMP today.   2. Tobacco use - Encouraged cessation extensively today.  - hall walk in clinic sats > 95%  3. CKD 3b - Scr runs 1.30-1.50 - recheck labs - JLangdon DO  10:24 AM

## 2023-02-24 NOTE — ED Provider Notes (Signed)
[facility] Provider Note    Event Date/Time   First MD Initiated Contact with Patient 02/24/23 1709     (approximate)   History   Chief Complaint Hypokalemic   HPI  Emma Gould is a 67 y.o. female with past medical history of hypertension, hyperlipidemia, CKD, and CHF who presents to the ED complaining of abnormal labs.  Patient reports that she was seeing her PCP earlier today in follow-up and found to have a critically low potassium.  She reports feeling weak and malaised in general with intermittent pain in the right upper quadrant of her abdomen as well as vomiting.  She states her oral intake has been decreased manage she was previously prescribed potassium supplementation, but has not wanted to take this because it exacerbates her nausea.  She was recently admitted to the hospital for CHF exacerbation and diuresed at that time, states swelling in her legs is improved and she is not having any difficulty breathing.  She continues to take torsemide, denies any recent change in her dose.     Physical Exam   Triage Vital Signs: ED Triage Vitals [02/24/23 1657]  Enc Vitals Group     BP 112/72     Pulse Rate 72     Resp 17     Temp 98.2 F (36.8 C)     Temp Source Oral     SpO2 96 %     Weight 141 lb (64 kg)     Height      Head Circumference      Peak Flow      Pain Score 0     Pain Loc      Pain Edu?      Excl. in [place]?     Most recent vital signs: Vitals:   02/24/23 1830 02/24/23 1900  BP: 137/67 (!) 141/71  Pulse: 62 64  Resp: 16 12  Temp:    SpO2: 99% 96%    Constitutional: Alert and oriented. Eyes: Conjunctivae are normal. Head: Atraumatic. Nose: No congestion/rhinnorhea. Mouth/Throat: Mucous membranes are dry. Cardiovascular: Normal rate, regular rhythm. Grossly normal heart sounds.  2+ radial pulses bilaterally. Respiratory: Normal respiratory effort.  No retractions. Lungs CTAB. Gastrointestinal: Soft and tender to  palpation in the right upper quadrant with no rebound or guarding. No distention. Musculoskeletal: No lower extremity tenderness nor edema.  Neurologic:  Normal speech and language. No gross focal neurologic deficits are appreciated.    ED Results / Procedures / Treatments   Labs (all labs ordered are listed, but only abnormal results are displayed) Labs Reviewed  CBC - Abnormal; Notable for the following components:      Result Value   WBC 15.6 (*)    MCV 100.2 (*)    MCH 34.5 (*)    nRBC 0.4 (*)    All other components within normal limits  COMPREHENSIVE METABOLIC PANEL - Abnormal; Notable for the following components:   Sodium 131 (*)    Potassium <2.0 (*)    Chloride 87 (*)    Glucose, Bld 122 (*)    BUN 34 (*)    Creatinine, Ser 2.09 (*)    Calcium 8.3 (*)    Total Protein 6.0 (*)    Albumin 2.9 (*)    GFR, Estimated 26 (*)    Anion gap 19 (*)    All other components within normal limits  MAGNESIUM  LIPASE, BLOOD  URINALYSIS, ROUTINE W REFLEX MICROSCOPIC  PHOSPHORUS  EKG  ED ECG REPORT I, Blake Divine, the attending physician, personally viewed and interpreted this ECG.   Date: 02/24/2023  EKG Time: 17:52  Rate: 65  Rhythm: normal sinus rhythm  Axis: Normal  Intervals:left posterior fascicular block and prolonged QT  ST&T Change: None  RADIOLOGY Right upper quadrant ultrasound reviewed and interpreted by me with no gallstones, wall thickening, or pericholecystic fluid noted.  PROCEDURES:  Critical Care performed: Yes, see critical care procedure note(s)  .Critical Care  Performed by: Blake Divine, MD Authorized by: Blake Divine, MD   Critical care provider statement:    Critical care time (minutes):  30   Critical care time was exclusive of:  Separately billable procedures and treating other patients and teaching time   Critical care was necessary to treat or prevent imminent or life-threatening deterioration of the following  conditions:  Metabolic crisis   Critical care was time spent personally by me on the following activities:  Development of treatment plan with patient or surrogate, discussions with consultants, evaluation of patient's response to treatment, examination of patient, ordering and review of laboratory studies, ordering and review of radiographic studies, ordering and performing treatments and interventions, pulse oximetry, re-evaluation of patient's condition and review of old charts   I assumed direction of critical care for this patient from another provider in my specialty: no     Care discussed with: admitting provider      MEDICATIONS ORDERED IN ED: Medications  potassium chloride 10 mEq in 100 mL IVPB (10 mEq Intravenous New Bag/Given 02/24/23 1843)  magnesium sulfate IVPB 2 g 50 mL (2 g Intravenous New Bag/Given 02/24/23 1843)  potassium chloride SA (KLOR-CON M) CR tablet 40 mEq (40 mEq Oral Given 02/24/23 1832)     IMPRESSION / MDM / [place] / ED COURSE  I reviewed the triage vital signs and the nursing notes.                              67 y.o. female with past medical history of hypertension, hyperlipidemia, CKD, and CHF who presents to the ED with intermittent nausea, vomiting, and right upper quadrant abdominal pain for the past week, told by her PCP that she has a critically low potassium earlier today.  Patient's presentation is most consistent with acute presentation with potential threat to life or bodily function.  Differential diagnosis includes, but is not limited to, hypokalemia, hypomagnesemia, AKI, anemia, cholecystitis, biliary colic, pancreatitis, dehydration, medication effect.  Patient nontoxic-appearing and in no acute distress, vital signs are unremarkable.  EKG shows prolonged QT, likely related to hypokalemia.  Repeat labs confirm potassium of less than 2, will replete both potassium and magnesium.  Renal function appears comparable to recent levels but  downtrending over the past month, will give small amount of IV maintenance fluids with potassium supplementation.  Patient with mild leukocytosis but no significant anemia, LFTs are unremarkable.  We will further assess with right upper quadrant ultrasound given patient's recurrent pain and vomiting.  Anticipate admission for severe hypokalemia.  Right upper quadrant ultrasound is unremarkable, case discussed with hospitalist for admission.      FINAL CLINICAL IMPRESSION(S) / ED DIAGNOSES   Final diagnoses:  Hypokalemia  Prolonged Q-T interval on ECG     Rx / DC Orders   ED Discharge Orders     None        Note:  This document was prepared using Dragon voice  recognition software and may include unintentional dictation errors.   Blake Divine, MD 02/24/23 [phone]

## 2023-02-24 NOTE — Hospital Course (Signed)
Admission Potassium less than 2

## 2023-02-25 LAB — GLUCOSE, CAPILLARY: Glucose-Capillary: 135 mg/dL — ABNORMAL HIGH (ref 70–99)

## 2023-02-25 LAB — BASIC METABOLIC PANEL
Anion gap: 15 (ref 5–15)
BUN: 31 mg/dL — ABNORMAL HIGH (ref 8–23)
CO2: 27 mmol/L (ref 22–32)
Calcium: 8.4 mg/dL — ABNORMAL LOW (ref 8.9–10.3)
Chloride: 92 mmol/L — ABNORMAL LOW (ref 98–111)
Creatinine, Ser: 1.85 mg/dL — ABNORMAL HIGH (ref 0.44–1.00)
GFR, Estimated: 30 mL/min — ABNORMAL LOW (ref >60)
Glucose, Bld: 117 mg/dL — ABNORMAL HIGH (ref 70–99)
Potassium: 3.3 mmol/L — ABNORMAL LOW (ref 3.5–5.1)
Sodium: 134 mmol/L — ABNORMAL LOW (ref 135–145)

## 2023-02-25 LAB — COMPREHENSIVE METABOLIC PANEL
ALT: 11 U/L (ref 0–44)
AST: 13 U/L — ABNORMAL LOW (ref 15–41)
Albumin: 2.5 g/dL — ABNORMAL LOW (ref 3.5–5.0)
Alkaline Phosphatase: 67 U/L (ref 38–126)
Anion gap: 11 (ref 5–15)
BUN: 29 mg/dL — ABNORMAL HIGH (ref 8–23)
CO2: 30 mmol/L (ref 22–32)
Calcium: 8.2 mg/dL — ABNORMAL LOW (ref 8.9–10.3)
Chloride: 95 mmol/L — ABNORMAL LOW (ref 98–111)
Creatinine, Ser: 1.6 mg/dL — ABNORMAL HIGH (ref 0.44–1.00)
GFR, Estimated: 36 mL/min — ABNORMAL LOW (ref >60)
Glucose, Bld: 91 mg/dL (ref 70–99)
Potassium: 3 mmol/L — ABNORMAL LOW (ref 3.5–5.1)
Sodium: 136 mmol/L (ref 135–145)
Total Bilirubin: 0.4 mg/dL (ref 0.3–1.2)
Total Protein: 5.3 g/dL — ABNORMAL LOW (ref 6.5–8.1)

## 2023-02-25 LAB — URINALYSIS, ROUTINE W REFLEX MICROSCOPIC
Bilirubin Urine: NEGATIVE
Glucose, UA: 50 mg/dL — AB
Hgb urine dipstick: NEGATIVE
Ketones, ur: NEGATIVE mg/dL
Leukocytes,Ua: NEGATIVE
Nitrite: NEGATIVE
Protein, ur: NEGATIVE mg/dL
Specific Gravity, Urine: 1.013 (ref 1.005–1.030)
pH: 6 (ref 5.0–8.0)

## 2023-02-25 LAB — HIV ANTIBODY (ROUTINE TESTING W REFLEX): HIV Screen 4th Generation wRfx: NONREACTIVE

## 2023-02-25 LAB — CBC
HCT: 41 % (ref 36.0–46.0)
Hemoglobin: 13.7 g/dL (ref 12.0–15.0)
MCH: 34.4 pg — ABNORMAL HIGH (ref 26.0–34.0)
MCHC: 33.4 g/dL (ref 30.0–36.0)
MCV: 103 fL — ABNORMAL HIGH (ref 80.0–100.0)
Platelets: 208 10*3/uL (ref 150–400)
RBC: 3.98 MIL/uL (ref 3.87–5.11)
RDW: 13.6 % (ref 11.5–15.5)
WBC: 12.2 10*3/uL — ABNORMAL HIGH (ref 4.0–10.5)
nRBC: 0.2 % (ref 0.0–0.2)

## 2023-02-25 LAB — MRSA NEXT GEN BY PCR, NASAL: MRSA by PCR Next Gen: NOT DETECTED

## 2023-02-25 LAB — MAGNESIUM: Magnesium: 2.7 mg/dL — ABNORMAL HIGH (ref 1.7–2.4)

## 2023-02-25 LAB — LACTIC ACID, PLASMA: Lactic Acid, Venous: 1.2 mmol/L (ref 0.5–1.9)

## 2023-02-25 LAB — PHOSPHORUS: Phosphorus: 3 mg/dL (ref 2.5–4.6)

## 2023-02-25 MED ORDER — POTASSIUM CHLORIDE 10 MEQ/100ML IV SOLN
10.0000 meq | Freq: Once | INTRAVENOUS | Status: AC
  Administered 2023-02-25: 10 meq via INTRAVENOUS
  Filled 2023-02-25: qty 100

## 2023-02-25 MED ORDER — POTASSIUM CHLORIDE 10 MEQ/100ML IV SOLN
10.0000 meq | INTRAVENOUS | Status: AC
  Administered 2023-02-25 (×2): 10 meq via INTRAVENOUS
  Filled 2023-02-25 (×3): qty 100

## 2023-02-25 NOTE — Consult Note (Signed)
PHARMACY CONSULT NOTE - ELECTROLYTES  Pharmacy Consult for Electrolyte Monitoring and Replacement   Recent Labs: Potassium (mmol/L)  Date Value  02/25/2023 3.3 (L)   Magnesium (mg/dL)  Date Value  02/24/2023 1.7   Calcium (mg/dL)  Date Value  02/25/2023 8.4 (L)   Albumin (g/dL)  Date Value  02/24/2023 2.9 (L)   Phosphorus (mg/dL)  Date Value  02/24/2023 3.3   Sodium (mmol/L)  Date Value  02/25/2023 134 (L)   Corrected Ca: 9.2 mg/dL  Assessment  Emma Gould is a 67 y.o. female presenting with abnormal labs. PMH significant for HTN, HLD, CKD, CHF. Pharmacy has been consulted to monitor and replace electrolytes.  Diet: Regular  Goal of Therapy: Electrolytes WNL  Plan:  Potassium: <2.0 x2, give KCl 40 mEq PO x1 and KCl 10 mEq IV Q1H x4 Magnesium: 1.7, no replacement needed Phosphorus: 3.3, no replacement needed Sodium: 131, continue to monitor Chloride: 87, continue to monitor Check BMP at 0000  3/1:  K @ 0043 = 3.3  -  K of 3.3 is following 40 mEq KCl PO X 1 and KCl 10 mEq IV X 4 ( 1 dose remaining)  - will order additional KCl 10 mEq IV X 3 and recheck electrolytes with AM labs.    02/25/2023 1:17 AM

## 2023-02-25 NOTE — Assessment & Plan Note (Signed)
-  Nicotine patch 

## 2023-02-25 NOTE — Assessment & Plan Note (Signed)
    Latest Ref Rng & Units 02/25/2023   12:43 AM 02/24/2023    8:29 PM 02/24/2023    5:03 PM  BMP  Glucose 70 - 99 mg/dL 117   122   BUN 8 - 23 mg/dL 31   34   Creatinine 0.44 - 1.00 mg/dL 1.85  1.90  2.09   Sodium 135 - 145 mmol/L 134   131   Potassium 3.5 - 5.1 mmol/L 3.3   <2.0   Chloride 98 - 111 mmol/L 92   87   CO2 22 - 32 mmol/L 27   25   Calcium 8.9 - 10.3 mg/dL 8.4   8.3   Will replace and seek pharmacy consult for electrolyte monitoring.

## 2023-02-25 NOTE — Assessment & Plan Note (Signed)
Patient had normal cardiac cath that shows mild obstructive CAD. Currently chest pain-free. Will continue patient on Coreg, metoprolol.

## 2023-02-25 NOTE — Progress Notes (Signed)
Report to floor. Transported on monitor to 138 and released in stable condition to 1 C staff

## 2023-02-25 NOTE — Assessment & Plan Note (Signed)
    Latest Ref Rng & Units 02/25/2023   12:43 AM 02/24/2023    8:29 PM 02/24/2023    5:03 PM  BMP  Glucose 70 - 99 mg/dL 117   122   BUN 8 - 23 mg/dL 31   34   Creatinine 0.44 - 1.00 mg/dL 1.85  1.90  2.09   Sodium 135 - 145 mmol/L 134   131   Potassium 3.5 - 5.1 mmol/L 3.3   <2.0   Chloride 98 - 111 mmol/L 92   87   CO2 22 - 32 mmol/L 27   25   Calcium 8.9 - 10.3 mg/dL 8.4   8.3   Avoid nephrotoxic meds. Renally dose meds.

## 2023-02-25 NOTE — Assessment & Plan Note (Signed)
Continue sertraline once med rec is available.

## 2023-02-25 NOTE — Assessment & Plan Note (Signed)
Vitals:   02/24/23 1657 02/24/23 1730 02/24/23 1800 02/24/23 1830  BP: 112/72 134/71 (!) 143/76 137/67   02/24/23 1900 02/24/23 2000 02/24/23 2215 02/24/23 2300  BP: (!) 141/71 (!) 104/90 135/82 137/65  Patient continued on Coreg with as needed Lopressor.

## 2023-02-25 NOTE — Assessment & Plan Note (Signed)
Once electrolytes are repleted. Patient will need aggressive physical therapy and possibly short-term rehab.

## 2023-02-25 NOTE — Plan of Care (Signed)
  Problem: Health Behavior/Discharge Planning: Goal: Ability to safely manage health-related needs after discharge will improve Outcome: Progressing   

## 2023-02-25 NOTE — Progress Notes (Signed)
[facility] [place] NAME: Emma Gould    MR#:  [id number]  DATE OF BIRTH:  [birth year]  SUBJECTIVE:  no family at bedside. Patient states she feels a lot better. Came in with increasing weakness found to have potassium of less than 2.0. Patient received several rounds of IV potassium gradually improving. Continues to smoke about one pack per day. Denies any chest pain. No leg swelling. Follows with [facility]/MD.     VITALS:  Blood pressure (!) 104/52, pulse 68, temperature 97.9 F (36.6 C), resp. rate 12, weight 64 kg, SpO2 95 %.  PHYSICAL EXAMINATION:   GENERAL:  67 y.o.-year-old patient with no acute distress.  LUNGS: Normal breath sounds bilaterally, no wheezing CARDIOVASCULAR: S1, S2 normal. No murmur   ABDOMEN: Soft, nontender, nondistended. Bowel sounds present.  EXTREMITIES: No  edema b/l.    NEUROLOGIC: nonfocal  patient is alert and awake SKIN: No obvious rash, lesion, or ulcer.   LABORATORY PANEL:  CBC Recent Labs  Lab 02/25/23 0412  WBC 12.2*  HGB 13.7  HCT 41.0  PLT 208    Chemistries  Recent Labs  Lab 02/25/23 0412  NA 136  K 3.0*  CL 95*  CO2 30  GLUCOSE 91  BUN 29*  CREATININE 1.60*  CALCIUM 8.2*  MG 2.7*  AST 13*  ALT 11  ALKPHOS 67  BILITOT 0.4   Cardiac Enzymes No results for input(s): "TROPONINI" in the last 168 hours. RADIOLOGY:  US Abdomen Limited RUQ (LIVER/GB)  Result Date: 02/24/2023 CLINICAL DATA:  Right upper quadrant abdominal pain EXAM: ULTRASOUND ABDOMEN LIMITED RIGHT UPPER QUADRANT COMPARISON:  None Available. FINDINGS: Gallbladder: The gallbladder is mildly distended, however, no intraluminal stones or sludge, gallbladder wall thickening, or pericholecystic fluid is identified. The sonographic Percell Miller sign is reportedly negative. Common bile duct: Diameter: 7 mm in mid diameter, at the upper limits of normal for age Liver: No focal lesion identified. Within normal  limits in parenchymal echogenicity. Portal vein is patent on color Doppler imaging with normal direction of blood flow towards the liver. Other: None. IMPRESSION: 1. Normal right upper quadrant sonogram. Electronically Signed   By: Fidela Salisbury M.D.   On: 02/24/2023 18:49    Assessment and Plan [name] is a 67 y.o. female with past medical history of hypertension, hyperlipidemia, CKD, and CHF who presents to the ED complaining of abnormal labs.  Patient reports that she was seeing her PCP earlier today in follow-up and found to have a critically low potassium.  She reports feeling weak and malaised in general with intermittent pain in the right upper quadrant of her abdomen as well as vomiting.   Generalized weakness suspected due to severely low potassium setting of diuretics -- came in with potassium of<2.0 -- received IV potassium. Potassium today 3.0. Continue oral replacement. Holding torsemide -- no acute EKG changes -- transfer out of ICU -- magnesium stable  Coronary artery disease with history of angina -- remote cardiac cat showed mild obstructive CAD -- continue Coreg, Jardiance and Spironolactone. --pt follows with [facility]  Acute on chronic CKD stage IIIB -- creatinine much improved after IV fluids. -- Monitor input output  Anxiety chronic -- continue home meds  Tobacco abuse -- patient advised smoking cessation  PT to see patient given generalized weakness   Procedures:none Family communication :none Consults :none CODE STATUS: full DVT Prophylaxis : heparin Level of care: Telemetry Medical Status is: Inpatient  Remains inpatient appropriate because: replacing potassium. Physical therapy to see patient. Discharge tomorrow if remains stable  TOTAL TIME TAKING CARE OF THIS PATIENT: 35 minutes.  >50% time spent on counselling and coordination of care  Note: This dictation was prepared with Dragon dictation along with smaller phrase technology. Any  transcriptional errors that result from this process are unintentional.  Fritzi Mandes M.D    [facility]   CC: Primary care physician; Patrecia Pour, Christean Grief, MD

## 2023-02-25 NOTE — Assessment & Plan Note (Signed)
IV PPI therapy.  

## 2023-02-26 ENCOUNTER — Encounter: Payer: Self-pay | Admitting: Internal Medicine

## 2023-02-26 ENCOUNTER — Other Ambulatory Visit: Payer: Self-pay | Admitting: Family

## 2023-02-26 DIAGNOSIS — I10 Essential (primary) hypertension: Secondary | ICD-10-CM

## 2023-02-26 LAB — BASIC METABOLIC PANEL
Anion gap: 9 (ref 5–15)
BUN: 27 mg/dL — ABNORMAL HIGH (ref 8–23)
CO2: 26 mmol/L (ref 22–32)
Calcium: 8.1 mg/dL — ABNORMAL LOW (ref 8.9–10.3)
Chloride: 101 mmol/L (ref 98–111)
Creatinine, Ser: 1.21 mg/dL — ABNORMAL HIGH (ref 0.44–1.00)
GFR, Estimated: 50 mL/min — ABNORMAL LOW (ref >60)
Glucose, Bld: 84 mg/dL (ref 70–99)
Potassium: 2.7 mmol/L — CL (ref 3.5–5.1)
Sodium: 136 mmol/L (ref 135–145)

## 2023-02-26 LAB — POTASSIUM: Potassium: 3.8 mmol/L (ref 3.5–5.1)

## 2023-02-26 LAB — MAGNESIUM: Magnesium: 2.5 mg/dL — ABNORMAL HIGH (ref 1.7–2.4)

## 2023-02-26 MED ORDER — POTASSIUM CHLORIDE CRYS ER 20 MEQ PO TBCR: 40.0000 meq | EXTENDED_RELEASE_TABLET | Freq: Every day | ORAL | 0 refills | Status: DC

## 2023-02-26 MED ORDER — POTASSIUM CHLORIDE CRYS ER 20 MEQ PO TBCR
40.0000 meq | EXTENDED_RELEASE_TABLET | ORAL | Status: AC
  Administered 2023-02-26 (×2): 40 meq via ORAL
  Filled 2023-02-26 (×2): qty 2

## 2023-02-26 MED ORDER — POTASSIUM CHLORIDE 10 MEQ/100ML IV SOLN
10.0000 meq | INTRAVENOUS | Status: DC
  Administered 2023-02-26: 10 meq via INTRAVENOUS
  Filled 2023-02-26: qty 100

## 2023-02-26 MED ORDER — POTASSIUM CHLORIDE CRYS ER 20 MEQ PO TBCR
40.0000 meq | EXTENDED_RELEASE_TABLET | Freq: Once | ORAL | Status: AC
  Administered 2023-02-26: 40 meq via ORAL
  Filled 2023-02-26: qty 2

## 2023-02-26 MED ORDER — DIPHENOXYLATE-ATROPINE 2.5-0.025 MG PO TABS
1.0000 | ORAL_TABLET | Freq: Four times a day (QID) | ORAL | Status: DC | PRN
  Administered 2023-02-26: 1 via ORAL
  Filled 2023-02-26: qty 1

## 2023-02-26 MED ORDER — QUETIAPINE FUMARATE 25 MG PO TABS: 100.0000 mg | ORAL_TABLET | Freq: Every day | ORAL | 0 refills | Status: DC

## 2023-02-26 NOTE — Discharge Instructions (Signed)
Stop smoking Keep up with your Cardiology appt as scheduled

## 2023-02-26 NOTE — TOC Initial Note (Signed)
Transition of Care [facility]) - Initial/Assessment Note    Patient Details  Name: Emma Gould MRN: [id number] Date of Birth: 03-09-1956  Transition of Care [facility]) CM/SW Contact:    Rebekah [place], LCSW Phone Number: 02/26/2023, 4:43 PM  Clinical Narrative:                 CSW spoke with patient. CSW introduced role and discussed PT evaluation recommendation for [facility] PT. Pt was agreeable noting no preferences. LCSW informed her she will reach out to [facility]. Leanne with [facility] approved referral and will contact pt by Tuesday, 03/01/23      Patient Goals and CMS Choice            Expected Discharge Plan and Services       Living arrangements for the past 2 months: Single Family Home Expected Discharge Date: 02/26/23                         HH Arranged: PT HH Agency: [place] Date [place]: 02/26/23 Time HH Agency Contacted: 53 Representative spoke with at [place]: Larence Penning  Prior Living Arrangements/Services Living arrangements for the past 2 months: [place] with:: Significant Other Patient language and need for interpreter reviewed:: Yes Do you feel safe going back to the place where you live?: Yes      Need for Family Participation in Patient Care: Yes (Comment) Care giver support system in place?: Yes (comment) Current home services: DME (Rollator (4 wheels);BSC/3in1) Criminal Activity/Legal Involvement Pertinent to Current Situation/Hospitalization: No - Comment as needed  Activities of Daily Living Home Assistive Devices/Equipment: Bedside commode/3-in-1, Walker (specify type), Dentures (specify type), Eyeglasses ADL Screening (condition at time of admission) Patient's cognitive ability adequate to safely complete daily activities?: Yes Is the patient deaf or have difficulty hearing?: No Does the patient have difficulty seeing, even when wearing glasses/contacts?: No Does the patient have difficulty concentrating,  remembering, or making decisions?: No Patient able to express need for assistance with ADLs?: Yes Does the patient have difficulty dressing or bathing?: No Independently performs ADLs?: Yes (appropriate for developmental age) Does the patient have difficulty walking or climbing stairs?: No Weakness of Legs: Both Weakness of Arms/Hands: None  Permission Sought/Granted                  Emotional Assessment   Attitude/Demeanor/Rapport: Engaged Affect (typically observed): Appropriate Orientation: : Oriented to Self, Oriented to Place, Oriented to  Time, Oriented to Situation Alcohol / Substance Use: Not Applicable Psych Involvement: No (comment)  Admission diagnosis:  Hypokalemia [E87.6] Prolonged Q-T interval on ECG [R94.31] Diuretic-induced hypokalemia [E87.6, T50.2X5A] Patient Active Problem List   Diagnosis Date Noted   Diuretic-induced hypokalemia 02/24/2023   Ischemic colitis ([place]) 10/31/2022   GAD (generalized anxiety disorder) 10/31/2022   Mixed hyperlipidemia 10/31/2022   Acute renal failure superimposed on stage 3a chronic kidney disease ([place]) 10/30/2022   Dehydration 10/30/2022   Generalized weakness 10/30/2022   Hyperkalemia 10/30/2022   Colitis 10/30/2022   Coronary artery disease involving native coronary artery of native heart with angina pectoris ([place]) 03/11/2022   Diarrhea 09/01/2019   Abdominal pain 09/01/2019   Hypertension    GERD (gastroesophageal reflux disease)    Depression    Migraine    Tobacco abuse    Acute respiratory disease due to COVID-19 virus    PCP:  Doreatha Lew, MD Pharmacy:   [id number] [address] Phone: [phone] Fax: [phone]  [id number] [place] [address] [id number]: [phone]: [phone]    Social Determinants of Health (SDOH) Social History: SDOH Screenings   Food  Insecurity: No Food Insecurity (02/24/2023)  Housing: Low Risk  (02/24/2023)  Transportation Needs: No Transportation Needs (02/24/2023)  Utilities: Not At Risk (02/24/2023)  Tobacco Use: High Risk (02/24/2023)   SDOH Interventions:     Readmission Risk Interventions     No data to display

## 2023-02-26 NOTE — Progress Notes (Signed)
       CROSS COVER NOTE  NAME: Briany Orrico MRN: [id number] DOB : January 21, 1956    HPI/Events of Note   Potassium level 2.8  Assessment and  Interventions   Assessment:  Plan: 40 meq potassium IV (four 10 meq runs 40 meq K dur x1 F/u mag level goal above 2 supplement as needed      Kathlene Cote NP [facility]

## 2023-02-26 NOTE — Consult Note (Signed)
PHARMACY CONSULT NOTE - ELECTROLYTES  Pharmacy Consult for Electrolyte Monitoring and Replacement   Recent Labs: Potassium (mmol/L)  Date Value  02/26/2023 2.7 (LL)   Magnesium (mg/dL)  Date Value  02/26/2023 2.5 (H)   Calcium (mg/dL)  Date Value  02/26/2023 8.1 (L)   Albumin (g/dL)  Date Value  02/25/2023 2.5 (L)   Phosphorus (mg/dL)  Date Value  02/25/2023 3.0   Sodium (mmol/L)  Date Value  02/26/2023 136   Assessment  Emma Gould is a 67 y.o. female presenting with abnormal labs. PMH significant for HTN, HLD, CKD, CHF. Pharmacy has been consulted to monitor and replace electrolytes.  Goal of Therapy: Electrolytes within normal limits  Plan:  Potassium: 2.7, give Kcl 120 mEq PO today in divided doses. Re-check potassium at 1400 Magnesium: 2.5, no replacement needed  Benita Gutter 02/26/2023 7:24 AM

## 2023-02-26 NOTE — Evaluation (Signed)
Physical Therapy Evaluation Patient Details Name: Emma Gould MRN: [id number] DOB: 1956/11/24 Today's Date: 02/26/2023  History of Present Illness  Emma Gould is an 67 y.o. female seen in the emergency room today for generalized weakness.  Patient was seen by her cardiologist for hospital follow-up appointment for hospitalization she had on October 30, 2022 and was discharged.  Patient was seen by cardiologist today and was advised to come to the ER after reviewing her blood work from earlier today showing severe hypokalemia.  Patient reports she stopped taking her potassium supplements because they made her nauseous. PMH significant: anxiety/depression, CHF, CKD, HTN, Fibromyalgia.  Clinical Impression  67 yo Female admitted with imbalance of electrolytes with significant hypokalemia. Patient's potassium level was still low at 2.7, however she denies any symptoms of tetany or arrhythmias. She has chronic hypokalemia and is still getting potassium medication. Therefore, PT evaluation continued. Pt lives at home with her boyfriend who is working during the day. She reports needing assistance for tub transfers/bathing and some dressing. She has a rollator which she uses prn. Patient denies any recent falls but does report occasional unsteadiness. She was up in chair at start of evaluation and therefore bed mobility was not assessed. Today she required CGA for sit<>Stand transfer with RW. She was able to ambulate 140 feet with RW with slower gait speed indicating increased risk for falls and home ambulator. She does fatigue quickly which is why she uses a rollator prn at home. Patient exhibits increased weakness in BLE, particularly hip/ankle, 3/5; her knee strength is grossly 4-/5. Patient reports getting home health PT up until 2 weeks ago and has been doing exercises. She would benefit from additional skilled PT Intervention to improve strength and mobility. Recommend additional home health PT upon  discharge. Patient would benefit from shower seat at home; recommend home health PT facilitate getting equipment once they do a home assessment to ensure proper fit.        Recommendations for follow up therapy are one component of a multi-disciplinary discharge planning process, led by the attending physician.  Recommendations may be updated based on patient status, additional functional criteria and insurance authorization.  Follow Up Recommendations Home health PT      Assistance Recommended at Discharge Intermittent Supervision/Assistance  Patient can return home with the following  A little help with walking and/or transfers;A little help with bathing/dressing/bathroom;Assistance with cooking/housework;Assist for transportation;Help with stairs or ramp for entrance    Equipment Recommendations Other (comment) (Pt would benefit from shower seat, however recommend home health PT assist patient with ordering once they do a home assessment to ensure proper fit.)  Recommendations for Other Services       Functional Status Assessment Patient has had a recent decline in their functional status and demonstrates the ability to make significant improvements in function in a reasonable and predictable amount of time.     Precautions / Restrictions Precautions Precautions: Fall Precaution Comments: no recent falls but does stumble/unsteady; Restrictions Weight Bearing Restrictions: No      Mobility  Bed Mobility               General bed mobility comments: not formally assessed as patient was sitting up in recliner at start of evaluation;    Transfers Overall transfer level: Needs assistance Equipment used: Rolling walker (2 wheels) Transfers: Sit to/from Stand Sit to Stand: Min guard           General transfer comment: with cues for hand placement for  safety;    Ambulation/Gait Ambulation/Gait assistance: Min guard Gait Distance (Feet): 140 Feet Assistive device:  Rolling walker (2 wheels)   Gait velocity: 10 feet walk test: 1.02 feet/sec Gait velocity interpretation: <1.31 ft/sec, indicative of household ambulator   General Gait Details: ambulates with reciprocal gait pattern, normal base of support, slower gait speed with occasional unsteadiness requiring CGA for safety  Stairs            Wheelchair Mobility    Modified Rankin (Stroke Patients Only)       Balance Overall balance assessment: Needs assistance Sitting-balance support: No upper extremity supported, Feet supported Sitting balance-Leahy Scale: Good     Standing balance support: No upper extremity supported Standing balance-Leahy Scale: Fair Standing balance comment: Static standing balance is good- able to hold static stance feet together eyes open/closed for 10 sec with no instability; dynamic standing balance is fair, requiring RW and CGA for stability when walking;                             Pertinent Vitals/Pain Pain Assessment Pain Assessment: No/denies pain    Home Living Family/patient expects to be discharged to:: Private residence Living Arrangements: Spouse/significant other Available Help at Discharge: Family;Friend(s);Available PRN/intermittently Type of Home: Mobile home Home Access: Stairs to enter Entrance Stairs-Rails: Right;Left;Can reach both Entrance Stairs-Number of Steps: 7   Home Layout: One level Home Equipment: Rollator (4 wheels);BSC/3in1      Prior Function Prior Level of Function : Independent/Modified Independent;Needs assist       Physical Assist : ADLs (physical)   ADLs (physical): Bathing Mobility Comments: would use rollator prn especially when feeling dizzy/unsteady ADLs Comments: required assistance from significant other for tub transfers;     Hand Dominance   Dominant Hand: Right    Extremity/Trunk Assessment   Upper Extremity Assessment Upper Extremity Assessment: Generalized weakness    Lower  Extremity Assessment Lower Extremity Assessment: Generalized weakness (BLE strength: hip 3/5, knee 4-/5, ankle 3/5;)    Cervical / Trunk Assessment Cervical / Trunk Assessment: Kyphotic  Communication   Communication: No difficulties  Cognition Arousal/Alertness: Awake/alert Behavior During Therapy: WFL for tasks assessed/performed Overall Cognitive Status: Within Functional Limits for tasks assessed                                 General Comments: alert and oriented x4        General Comments General comments (skin integrity, edema, etc.): multiple bruises/cuts;    Exercises     Assessment/Plan    PT Assessment Patient needs continued PT services  PT Problem List Decreased strength;Decreased balance;Decreased mobility;Decreased activity tolerance       PT Treatment Interventions Functional mobility training;Balance training;Patient/family education;Gait training;Therapeutic activities;Neuromuscular re-education;Stair training;Therapeutic exercise    PT Goals (Current goals can be found in the Care Plan section)  Acute Rehab PT Goals Patient Stated Goal: "to go home." PT Goal Formulation: With patient Time For Goal Achievement: 03/12/23 Potential to Achieve Goals: Good    Frequency Min 2X/week     Co-evaluation               AM-PAC PT "6 Clicks" Mobility  Outcome Measure Help needed turning from your back to your side while in a flat bed without using bedrails?: A Little Help needed moving from lying on your back to sitting on the side of a flat  bed without using bedrails?: A Little Help needed moving to and from a bed to a chair (including a wheelchair)?: A Little Help needed standing up from a chair using your arms (e.g., wheelchair or bedside chair)?: A Little Help needed to walk in hospital room?: A Little Help needed climbing 3-5 steps with a railing? : A Lot 6 Click Score: 17    End of Session Equipment Utilized During Treatment: Gait  belt Activity Tolerance: Patient tolerated treatment well;No increased pain Patient left: in chair;with call bell/phone within reach;with chair alarm set;with nursing/sitter in room Nurse Communication: Mobility status;Other (comment) (pt had cut on right elbow and required bandage;) PT Visit Diagnosis: Unsteadiness on feet (R26.81);Muscle weakness (generalized) (M62.81)    Time: [id number] PT Time Calculation (min) (ACUTE ONLY): 25 min   Charges:   PT Evaluation $PT Eval Low Complexity: 1 Low           Joylyn Duggin PT, DPT 02/26/2023, 1:27 PM

## 2023-02-27 ENCOUNTER — Other Ambulatory Visit: Payer: Self-pay | Admitting: Internal Medicine

## 2023-02-27 DIAGNOSIS — E876 Hypokalemia: Secondary | ICD-10-CM

## 2023-03-03 ENCOUNTER — Telehealth: Payer: Self-pay | Admitting: Cardiology

## 2023-03-03 DIAGNOSIS — I5032 Chronic diastolic (congestive) heart failure: Secondary | ICD-10-CM

## 2023-03-03 NOTE — Telephone Encounter (Signed)
Patient called in with questions since being released from ED.   Patient is wanting to know where she can get her blood work done at, after hours. She wants to know if she can go an urgent care and they transfer them to the office for Dr. Or can she be told where she can go to get them done at.    Please call and advise

## 2023-03-03 NOTE — Telephone Encounter (Signed)
Emma Gould agreed to place orders for BMET to recheck potassium, related to patient's recent hospitalization.  Called and explained to patient that she should return to medical mall at [facility] anytime before 6:30 pm for lab work. Explained we will call her with results. Pt verbalized understanding and had no questions or concerns at this time. BMET order placed.

## 2023-03-03 NOTE — Discharge Summary (Addendum)
Physician Discharge Summary   Patient: Emma Gould MRN: [id number] DOB: Jul 02, 1956  Admit date:     02/24/2023  Discharge date: 02/26/23  Discharge Physician: Fritzi Mandes   PCP: Patrecia Pour, Christean Grief, MD   Recommendations at discharge:    F/u PCP in 1 week  Discharge Diagnoses: Principal Problem:   Diuretic-induced hypokalemia Active Problems:   Generalized weakness   Coronary artery disease involving native coronary artery of native heart with angina pectoris (HCC)   Acute renal failure superimposed on stage 3a chronic kidney disease (HCC)   GAD (generalized anxiety disorder)   GERD (gastroesophageal reflux disease)   Hypertension   Tobacco abuse  Emma Gould is a 67 y.o. female with past medical history of hypertension, hyperlipidemia, CKD, and CHF who presents to the ED complaining of abnormal labs.  Patient reports that she was seeing her PCP earlier today in follow-up and found to have a critically low potassium.  She reports feeling weak and malaised in general with intermittent pain in the right upper quadrant of her abdomen as well as vomiting.    Generalized weakness suspected due to severely low potassium setting of diuretics -- came in with potassium of<2.0 -- received IV potassium. Potassium today 3.8.  -- no acute EKG changes -- magnesium stable --pt overall feels better   Coronary artery disease with history of angina -- remote cardiac cat showed mild obstructive CAD -- continue Coreg, Jardiance and Spironolactone. --pt follows with [facility]   Acute on chronic CKD stage IIIB -- creatinine much improved after IV fluids. -- Monitor input output   Anxiety chronic -- continue home meds   Tobacco abuse -- patient advised smoking cessation   PT to see patient given generalized weakness--HHPT setup at discharge     Procedures:none Family communication :none Consults :none CODE STATUS: full    Disposition: Home health Diet recommendation:  Cardiac  diet DISCHARGE MEDICATION: Allergies as of 02/26/2023   No Known Allergies      Medication List     TAKE these medications    acetaminophen 325 MG tablet Commonly known as: TYLENOL Take 2 tablets (650 mg total) by mouth every 6 (six) hours as needed for mild pain or headache (fever >/= 101).   albuterol 108 (90 Base) MCG/ACT inhaler Commonly known as: VENTOLIN HFA Inhale 2 puffs into the lungs 4 (four) times daily as needed for shortness of breath.   carvedilol 6.25 MG tablet Commonly known as: COREG Take 1 tablet (6.25 mg total) by mouth 2 (two) times daily with a meal.   empagliflozin 10 MG Tabs tablet Commonly known as: Jardiance Take 1 tablet (10 mg total) by mouth daily before breakfast.   Eszopiclone 3 MG Tabs Take 3 mg by mouth at bedtime.   ezetimibe 10 MG tablet Commonly known as: ZETIA Take 10 mg by mouth daily.   omeprazole 40 MG capsule Commonly known as: PRILOSEC Take 40 mg by mouth daily.   ondansetron 8 MG disintegrating tablet Commonly known as: ZOFRAN-ODT Take by mouth.   oxyCODONE-acetaminophen 10-325 MG tablet Commonly known as: PERCOCET Take 1 tablet by mouth 4 (four) times daily as needed for pain.   potassium chloride SA 20 MEQ tablet Commonly known as: KLOR-CON M Take 2 tablets (40 mEq total) by mouth daily.   QUEtiapine 25 MG tablet Commonly known as: SEROQUEL Take 4 tablets (100 mg total) by mouth at bedtime. What changed:  how much to take Another medication with the same name was removed. Continue  taking this medication, and follow the directions you see here.   sertraline 100 MG tablet Commonly known as: ZOLOFT Take 100 mg by mouth daily.   spironolactone 25 MG tablet Commonly known as: ALDACTONE Take 12.5 mg by mouth daily.   SUMAtriptan 50 MG tablet Commonly known as: IMITREX Take 50 mg by mouth as needed for migraine. Take a second tablet 2 hours later if needed; max '200mg'$ /day   topiramate 100 MG tablet Commonly known  as: TOPAMAX Take 100 mg by mouth 2 (two) times daily.   torsemide 20 MG tablet Commonly known as: DEMADEX Take 1 tablet (20 mg total) by mouth daily.        Follow-up Information     Patrecia Pour, Christean Grief, MD. Schedule an appointment as soon as possible for a visit in 1 week(s).   Specialty: Family Medicine Contact information: [place] [address] [phone]                  Condition at discharge: fair  The results of significant diagnostics from this hospitalization (including imaging, microbiology, ancillary and laboratory) are listed below for reference.   Imaging Studies: US Abdomen Limited RUQ (LIVER/GB)  Result Date: 02/24/2023 CLINICAL DATA:  Right upper quadrant abdominal pain EXAM: ULTRASOUND ABDOMEN LIMITED RIGHT UPPER QUADRANT COMPARISON:  None Available. FINDINGS: Gallbladder: The gallbladder is mildly distended, however, no intraluminal stones or sludge, gallbladder wall thickening, or pericholecystic fluid is identified. The sonographic Percell Miller sign is reportedly negative. Common bile duct: Diameter: 7 mm in mid diameter, at the upper limits of normal for age Liver: No focal lesion identified. Within normal limits in parenchymal echogenicity. Portal vein is patent on color Doppler imaging with normal direction of blood flow towards the liver. Other: None. IMPRESSION: 1. Normal right upper quadrant sonogram. Electronically Signed   By: Fidela Salisbury M.D.   On: 02/24/2023 18:49    Microbiology: Results for orders placed or performed during the hospital encounter of 02/24/23  MRSA Next Gen by PCR, Nasal     Status: None   Collection Time: 02/24/23 10:42 PM   Specimen: Nasal Swab  Result Value Ref Range Status   MRSA by PCR Next Gen NOT DETECTED NOT DETECTED Final    Comment: (NOTE) The GeneXpert MRSA Assay (FDA approved for NASAL specimens only), is one component of a comprehensive MRSA colonization surveillance program. It is not  intended to diagnose MRSA infection nor to guide or monitor treatment for MRSA infections. Test performance is not FDA approved in patients less than 19 years old. Performed at [facility], [address] [place], [place] [address]     Labs: CBC: Recent Labs  Lab 02/24/23 1703 02/25/23 0412  WBC 15.6* 12.2*  HGB 14.9 13.7  HCT 43.3 41.0  MCV 100.2* 103.0*  PLT 256 [id number]   Basic Metabolic Panel: Recent Labs  Lab 02/24/23 1702 02/24/23 1703 02/24/23 2029 02/25/23 0043 02/25/23 0412 02/26/23 0405 02/26/23 1356  NA  --  131*  --  134* 136 136  --   K  --  <2.0*  --  3.3* 3.0* 2.7* 3.8  CL  --  87*  --  92* 95* 101  --   CO2  --  25  --  '27 30 26  '$ --   GLUCOSE  --  122*  --  117* 91 84  --   BUN  --  34*  --  31* 29* 27*  --  CREATININE  --  2.09* 1.90* 1.85* 1.60* 1.21*  --   CALCIUM  --  8.3*  --  8.4* 8.2* 8.1*  --   MG 1.7  --   --   --  2.7* 2.5*  --   PHOS  --   --   --   --  3.0  --   --    Liver Function Tests: Recent Labs  Lab 02/24/23 1703 02/25/23 0412  AST 20 13*  ALT 12 11  ALKPHOS 73 67  BILITOT 0.5 0.4  PROT 6.0* 5.3*  ALBUMIN 2.9* 2.5*   CBG: Recent Labs  Lab 02/24/23 2207  GLUCAP 135*    Discharge time spent: greater than 30 minutes.  Signed: Fritzi Mandes, MD [facility] 03/03/2023

## 2023-03-07 ENCOUNTER — Ambulatory Visit: Payer: Medicare Other

## 2023-03-11 ENCOUNTER — Other Ambulatory Visit: Payer: Self-pay | Admitting: Internal Medicine

## 2023-03-22 ENCOUNTER — Ambulatory Visit: Payer: Medicare Other | Attending: Cardiology

## 2023-04-07 ENCOUNTER — Inpatient Hospital Stay
Admission: EM | Admit: 2023-04-07 | Discharge: 2023-04-27 | DRG: 391 | Disposition: E | Payer: Medicare Other | Attending: Pulmonary Disease | Admitting: Pulmonary Disease

## 2023-04-07 ENCOUNTER — Emergency Department: Payer: Medicare Other

## 2023-04-07 ENCOUNTER — Other Ambulatory Visit: Payer: Self-pay

## 2023-04-07 DIAGNOSIS — J9601 Acute respiratory failure with hypoxia: Secondary | ICD-10-CM | POA: Diagnosis not present

## 2023-04-07 DIAGNOSIS — K635 Polyp of colon: Secondary | ICD-10-CM | POA: Diagnosis present

## 2023-04-07 DIAGNOSIS — E872 Acidosis, unspecified: Secondary | ICD-10-CM | POA: Diagnosis present

## 2023-04-07 DIAGNOSIS — R578 Other shock: Secondary | ICD-10-CM | POA: Diagnosis not present

## 2023-04-07 DIAGNOSIS — Z515 Encounter for palliative care: Secondary | ICD-10-CM

## 2023-04-07 DIAGNOSIS — R7989 Other specified abnormal findings of blood chemistry: Secondary | ICD-10-CM | POA: Diagnosis not present

## 2023-04-07 DIAGNOSIS — G931 Anoxic brain damage, not elsewhere classified: Secondary | ICD-10-CM | POA: Diagnosis not present

## 2023-04-07 DIAGNOSIS — N1831 Chronic kidney disease, stage 3a: Secondary | ICD-10-CM | POA: Diagnosis present

## 2023-04-07 DIAGNOSIS — G8929 Other chronic pain: Secondary | ICD-10-CM | POA: Diagnosis present

## 2023-04-07 DIAGNOSIS — K7201 Acute and subacute hepatic failure with coma: Secondary | ICD-10-CM | POA: Diagnosis not present

## 2023-04-07 DIAGNOSIS — Z66 Do not resuscitate: Secondary | ICD-10-CM | POA: Diagnosis not present

## 2023-04-07 DIAGNOSIS — I824Z2 Acute embolism and thrombosis of unspecified deep veins of left distal lower extremity: Secondary | ICD-10-CM | POA: Diagnosis not present

## 2023-04-07 DIAGNOSIS — Z981 Arthrodesis status: Secondary | ICD-10-CM

## 2023-04-07 DIAGNOSIS — R531 Weakness: Secondary | ICD-10-CM

## 2023-04-07 DIAGNOSIS — K5100 Ulcerative (chronic) pancolitis without complications: Secondary | ICD-10-CM | POA: Diagnosis present

## 2023-04-07 DIAGNOSIS — R739 Hyperglycemia, unspecified: Secondary | ICD-10-CM | POA: Diagnosis not present

## 2023-04-07 DIAGNOSIS — D631 Anemia in chronic kidney disease: Secondary | ICD-10-CM | POA: Diagnosis present

## 2023-04-07 DIAGNOSIS — F32A Depression, unspecified: Secondary | ICD-10-CM | POA: Diagnosis present

## 2023-04-07 DIAGNOSIS — I130 Hypertensive heart and chronic kidney disease with heart failure and stage 1 through stage 4 chronic kidney disease, or unspecified chronic kidney disease: Secondary | ICD-10-CM | POA: Diagnosis present

## 2023-04-07 DIAGNOSIS — K529 Noninfective gastroenteritis and colitis, unspecified: Principal | ICD-10-CM | POA: Diagnosis present

## 2023-04-07 DIAGNOSIS — I2699 Other pulmonary embolism without acute cor pulmonale: Secondary | ICD-10-CM | POA: Diagnosis not present

## 2023-04-07 DIAGNOSIS — R159 Full incontinence of feces: Secondary | ICD-10-CM | POA: Diagnosis not present

## 2023-04-07 DIAGNOSIS — R000 Tachycardia, unspecified: Secondary | ICD-10-CM | POA: Diagnosis not present

## 2023-04-07 DIAGNOSIS — I5033 Acute on chronic diastolic (congestive) heart failure: Secondary | ICD-10-CM | POA: Diagnosis not present

## 2023-04-07 DIAGNOSIS — Z7901 Long term (current) use of anticoagulants: Secondary | ICD-10-CM

## 2023-04-07 DIAGNOSIS — I25119 Atherosclerotic heart disease of native coronary artery with unspecified angina pectoris: Secondary | ICD-10-CM | POA: Diagnosis present

## 2023-04-07 DIAGNOSIS — Z7984 Long term (current) use of oral hypoglycemic drugs: Secondary | ICD-10-CM

## 2023-04-07 DIAGNOSIS — K219 Gastro-esophageal reflux disease without esophagitis: Secondary | ICD-10-CM | POA: Diagnosis present

## 2023-04-07 DIAGNOSIS — N179 Acute kidney failure, unspecified: Principal | ICD-10-CM | POA: Diagnosis present

## 2023-04-07 DIAGNOSIS — Z79899 Other long term (current) drug therapy: Secondary | ICD-10-CM

## 2023-04-07 DIAGNOSIS — I5032 Chronic diastolic (congestive) heart failure: Secondary | ICD-10-CM | POA: Diagnosis present

## 2023-04-07 DIAGNOSIS — I2723 Pulmonary hypertension due to lung diseases and hypoxia: Secondary | ICD-10-CM | POA: Diagnosis present

## 2023-04-07 DIAGNOSIS — F17210 Nicotine dependence, cigarettes, uncomplicated: Secondary | ICD-10-CM | POA: Diagnosis present

## 2023-04-07 DIAGNOSIS — I083 Combined rheumatic disorders of mitral, aortic and tricuspid valves: Secondary | ICD-10-CM | POA: Diagnosis present

## 2023-04-07 DIAGNOSIS — Z801 Family history of malignant neoplasm of trachea, bronchus and lung: Secondary | ICD-10-CM

## 2023-04-07 DIAGNOSIS — R791 Abnormal coagulation profile: Secondary | ICD-10-CM | POA: Diagnosis not present

## 2023-04-07 DIAGNOSIS — R001 Bradycardia, unspecified: Secondary | ICD-10-CM | POA: Diagnosis not present

## 2023-04-07 DIAGNOSIS — K633 Ulcer of intestine: Secondary | ICD-10-CM | POA: Diagnosis present

## 2023-04-07 DIAGNOSIS — G43909 Migraine, unspecified, not intractable, without status migrainosus: Secondary | ICD-10-CM | POA: Diagnosis present

## 2023-04-07 DIAGNOSIS — D696 Thrombocytopenia, unspecified: Secondary | ICD-10-CM | POA: Diagnosis not present

## 2023-04-07 DIAGNOSIS — E860 Dehydration: Secondary | ICD-10-CM | POA: Diagnosis present

## 2023-04-07 DIAGNOSIS — K2970 Gastritis, unspecified, without bleeding: Secondary | ICD-10-CM | POA: Diagnosis present

## 2023-04-07 DIAGNOSIS — Z6829 Body mass index (BMI) 29.0-29.9, adult: Secondary | ICD-10-CM

## 2023-04-07 DIAGNOSIS — I469 Cardiac arrest, cause unspecified: Secondary | ICD-10-CM | POA: Diagnosis not present

## 2023-04-07 DIAGNOSIS — I2489 Other forms of acute ischemic heart disease: Secondary | ICD-10-CM | POA: Diagnosis present

## 2023-04-07 DIAGNOSIS — E861 Hypovolemia: Secondary | ICD-10-CM | POA: Diagnosis present

## 2023-04-07 DIAGNOSIS — G9341 Metabolic encephalopathy: Secondary | ICD-10-CM | POA: Diagnosis present

## 2023-04-07 DIAGNOSIS — F419 Anxiety disorder, unspecified: Secondary | ICD-10-CM | POA: Diagnosis present

## 2023-04-07 DIAGNOSIS — R112 Nausea with vomiting, unspecified: Secondary | ICD-10-CM | POA: Diagnosis present

## 2023-04-07 DIAGNOSIS — Z8673 Personal history of transient ischemic attack (TIA), and cerebral infarction without residual deficits: Secondary | ICD-10-CM

## 2023-04-07 DIAGNOSIS — M797 Fibromyalgia: Secondary | ICD-10-CM | POA: Diagnosis present

## 2023-04-07 DIAGNOSIS — E8729 Other acidosis: Secondary | ICD-10-CM | POA: Diagnosis present

## 2023-04-07 DIAGNOSIS — E46 Unspecified protein-calorie malnutrition: Secondary | ICD-10-CM | POA: Diagnosis present

## 2023-04-07 DIAGNOSIS — R296 Repeated falls: Secondary | ICD-10-CM | POA: Diagnosis present

## 2023-04-07 DIAGNOSIS — Z9181 History of falling: Secondary | ICD-10-CM

## 2023-04-07 DIAGNOSIS — Z8249 Family history of ischemic heart disease and other diseases of the circulatory system: Secondary | ICD-10-CM

## 2023-04-07 DIAGNOSIS — Z823 Family history of stroke: Secondary | ICD-10-CM

## 2023-04-07 DIAGNOSIS — I82452 Acute embolism and thrombosis of left peroneal vein: Secondary | ICD-10-CM | POA: Diagnosis not present

## 2023-04-07 DIAGNOSIS — R54 Age-related physical debility: Secondary | ICD-10-CM | POA: Diagnosis present

## 2023-04-07 DIAGNOSIS — I2510 Atherosclerotic heart disease of native coronary artery without angina pectoris: Secondary | ICD-10-CM | POA: Diagnosis present

## 2023-04-07 DIAGNOSIS — E162 Hypoglycemia, unspecified: Secondary | ICD-10-CM | POA: Diagnosis not present

## 2023-04-07 DIAGNOSIS — E8809 Other disorders of plasma-protein metabolism, not elsewhere classified: Secondary | ICD-10-CM | POA: Diagnosis present

## 2023-04-07 DIAGNOSIS — K641 Second degree hemorrhoids: Secondary | ICD-10-CM | POA: Diagnosis present

## 2023-04-07 DIAGNOSIS — E876 Hypokalemia: Secondary | ICD-10-CM | POA: Diagnosis present

## 2023-04-07 LAB — CBC WITH DIFFERENTIAL/PLATELET
Abs Immature Granulocytes: 1.75 10*3/uL — ABNORMAL HIGH (ref 0.00–0.07)
Basophils Absolute: 0.3 10*3/uL — ABNORMAL HIGH (ref 0.0–0.1)
Basophils Relative: 2 %
Eosinophils Absolute: 0 10*3/uL (ref 0.0–0.5)
Eosinophils Relative: 0 %
HCT: 47.5 % — ABNORMAL HIGH (ref 36.0–46.0)
Hemoglobin: 15.6 g/dL — ABNORMAL HIGH (ref 12.0–15.0)
Immature Granulocytes: 11 %
Lymphocytes Relative: 19 %
Lymphs Abs: 3.2 10*3/uL (ref 0.7–4.0)
MCH: 33.8 pg (ref 26.0–34.0)
MCHC: 32.8 g/dL (ref 30.0–36.0)
MCV: 103 fL — ABNORMAL HIGH (ref 80.0–100.0)
Monocytes Absolute: 1.4 10*3/uL — ABNORMAL HIGH (ref 0.1–1.0)
Monocytes Relative: 9 %
Neutro Abs: 9.6 10*3/uL — ABNORMAL HIGH (ref 1.7–7.7)
Neutrophils Relative %: 59 %
Platelets: 199 10*3/uL (ref 150–400)
RBC: 4.61 MIL/uL (ref 3.87–5.11)
RDW: 14.3 % (ref 11.5–15.5)
Smear Review: NORMAL
WBC: 16.3 10*3/uL — ABNORMAL HIGH (ref 4.0–10.5)
nRBC: 0.1 % (ref 0.0–0.2)

## 2023-04-07 LAB — COMPREHENSIVE METABOLIC PANEL
ALT: 19 U/L (ref 0–44)
AST: 38 U/L (ref 15–41)
Albumin: 2.3 g/dL — ABNORMAL LOW (ref 3.5–5.0)
Alkaline Phosphatase: 173 U/L — ABNORMAL HIGH (ref 38–126)
Anion gap: 16 — ABNORMAL HIGH (ref 5–15)
BUN: 35 mg/dL — ABNORMAL HIGH (ref 8–23)
CO2: 14 mmol/L — ABNORMAL LOW (ref 22–32)
Calcium: 8.9 mg/dL (ref 8.9–10.3)
Chloride: 105 mmol/L (ref 98–111)
Creatinine, Ser: 3.17 mg/dL — ABNORMAL HIGH (ref 0.44–1.00)
GFR, Estimated: 16 mL/min — ABNORMAL LOW (ref >60)
Glucose, Bld: 131 mg/dL — ABNORMAL HIGH (ref 70–99)
Potassium: 3.7 mmol/L (ref 3.5–5.1)
Sodium: 135 mmol/L (ref 135–145)
Total Bilirubin: 0.8 mg/dL (ref 0.3–1.2)
Total Protein: 6.2 g/dL — ABNORMAL LOW (ref 6.5–8.1)

## 2023-04-07 LAB — LIPASE, BLOOD: Lipase: 27 U/L (ref 11–51)

## 2023-04-07 LAB — URINALYSIS, ROUTINE W REFLEX MICROSCOPIC
Bilirubin Urine: NEGATIVE
Glucose, UA: NEGATIVE mg/dL
Hgb urine dipstick: NEGATIVE
Ketones, ur: NEGATIVE mg/dL
Leukocytes,Ua: NEGATIVE
Nitrite: NEGATIVE
Protein, ur: NEGATIVE mg/dL
Specific Gravity, Urine: 1.006 (ref 1.005–1.030)
pH: 5 (ref 5.0–8.0)

## 2023-04-07 MED ORDER — LACTATED RINGERS IV BOLUS
1000.0000 mL | Freq: Once | INTRAVENOUS | Status: AC
  Administered 2023-04-07: 1000 mL via INTRAVENOUS

## 2023-04-07 MED ORDER — INSULIN ASPART 100 UNIT/ML IJ SOLN: 0.0000 [IU] | Freq: Three times a day (TID) | INTRAMUSCULAR | Status: DC

## 2023-04-07 MED ORDER — ONDANSETRON HCL 4 MG/2ML IJ SOLN
4.0000 mg | Freq: Four times a day (QID) | INTRAMUSCULAR | Status: DC | PRN
  Administered 2023-04-08 – 2023-04-14 (×13): 4 mg via INTRAVENOUS
  Filled 2023-04-07 (×13): qty 2

## 2023-04-07 MED ORDER — PANTOPRAZOLE SODIUM 40 MG PO TBEC
40.0000 mg | DELAYED_RELEASE_TABLET | Freq: Every day | ORAL | Status: DC
  Administered 2023-04-08 – 2023-04-14 (×6): 40 mg via ORAL
  Filled 2023-04-07 (×6): qty 1

## 2023-04-07 MED ORDER — SODIUM CHLORIDE 0.9 % IV SOLN
2.0000 g | Freq: Once | INTRAVENOUS | Status: AC
  Administered 2023-04-07: 2 g via INTRAVENOUS
  Filled 2023-04-07: qty 20

## 2023-04-07 MED ORDER — ACETAMINOPHEN 325 MG PO TABS
650.0000 mg | ORAL_TABLET | Freq: Four times a day (QID) | ORAL | Status: DC | PRN
  Administered 2023-04-09: 650 mg via ORAL
  Filled 2023-04-07 (×3): qty 2

## 2023-04-07 MED ORDER — QUETIAPINE FUMARATE 25 MG PO TABS
50.0000 mg | ORAL_TABLET | Freq: Two times a day (BID) | ORAL | Status: DC
  Administered 2023-04-07 – 2023-04-14 (×14): 50 mg via ORAL
  Filled 2023-04-07 (×15): qty 2

## 2023-04-07 MED ORDER — POTASSIUM CHLORIDE CRYS ER 20 MEQ PO TBCR: 40.0000 meq | EXTENDED_RELEASE_TABLET | Freq: Every day | ORAL | Status: DC

## 2023-04-07 MED ORDER — EZETIMIBE 10 MG PO TABS
10.0000 mg | ORAL_TABLET | Freq: Every day | ORAL | Status: DC
  Administered 2023-04-08 – 2023-04-14 (×7): 10 mg via ORAL
  Filled 2023-04-07 (×7): qty 1

## 2023-04-07 MED ORDER — SERTRALINE HCL 50 MG PO TABS
100.0000 mg | ORAL_TABLET | Freq: Every day | ORAL | Status: DC
  Administered 2023-04-08 – 2023-04-14 (×6): 100 mg via ORAL
  Filled 2023-04-07 (×7): qty 2

## 2023-04-07 MED ORDER — TOPIRAMATE 25 MG PO TABS
50.0000 mg | ORAL_TABLET | Freq: Two times a day (BID) | ORAL | Status: DC
  Administered 2023-04-08 – 2023-04-14 (×14): 50 mg via ORAL
  Filled 2023-04-07 (×15): qty 2

## 2023-04-07 MED ORDER — METRONIDAZOLE 500 MG/100ML IV SOLN
500.0000 mg | Freq: Three times a day (TID) | INTRAVENOUS | Status: DC
  Administered 2023-04-07 – 2023-04-12 (×14): 500 mg via INTRAVENOUS
  Filled 2023-04-07 (×15): qty 100

## 2023-04-07 MED ORDER — INSULIN ASPART 100 UNIT/ML IJ SOLN: 0.0000 [IU] | Freq: Every day | INTRAMUSCULAR | Status: DC

## 2023-04-07 MED ORDER — ENOXAPARIN SODIUM 40 MG/0.4ML IJ SOSY
40.0000 mg | PREFILLED_SYRINGE | INTRAMUSCULAR | Status: DC
Start: 2023-04-07 — End: 2023-04-07

## 2023-04-07 MED ORDER — SUMATRIPTAN SUCCINATE 50 MG PO TABS: 50.0000 mg | ORAL_TABLET | ORAL | Status: DC | PRN

## 2023-04-07 MED ORDER — ACETAMINOPHEN 650 MG RE SUPP: 650.0000 mg | Freq: Four times a day (QID) | RECTAL | Status: DC | PRN

## 2023-04-07 MED ORDER — SODIUM BICARBONATE 8.4 % IV SOLN
INTRAVENOUS | Status: AC
  Filled 2023-04-07 (×3): qty 1000
  Filled 2023-04-07: qty 150

## 2023-04-07 MED ORDER — CARVEDILOL 6.25 MG PO TABS
6.2500 mg | ORAL_TABLET | Freq: Two times a day (BID) | ORAL | Status: DC
  Administered 2023-04-08 – 2023-04-14 (×13): 6.25 mg via ORAL
  Filled 2023-04-07 (×13): qty 1

## 2023-04-07 MED ORDER — OXYCODONE-ACETAMINOPHEN 5-325 MG PO TABS
2.0000 | ORAL_TABLET | Freq: Four times a day (QID) | ORAL | Status: DC | PRN
  Administered 2023-04-07 – 2023-04-14 (×17): 2 via ORAL
  Filled 2023-04-07 (×17): qty 2

## 2023-04-07 MED ORDER — EMPAGLIFLOZIN 10 MG PO TABS: 10.0000 mg | ORAL_TABLET | Freq: Every day | ORAL | Status: DC

## 2023-04-07 MED ORDER — ONDANSETRON HCL 4 MG PO TABS
4.0000 mg | ORAL_TABLET | Freq: Four times a day (QID) | ORAL | Status: DC | PRN
  Administered 2023-04-08 – 2023-04-11 (×3): 4 mg via ORAL
  Filled 2023-04-07 (×4): qty 1

## 2023-04-07 MED ORDER — SPIRONOLACTONE 12.5 MG HALF TABLET
12.5000 mg | ORAL_TABLET | Freq: Every day | ORAL | Status: DC
  Administered 2023-04-08 – 2023-04-14 (×7): 12.5 mg via ORAL
  Filled 2023-04-07 (×8): qty 1

## 2023-04-07 NOTE — Assessment & Plan Note (Addendum)
Chronic diarrhea of unknown originPatient presents with nausea vomiting and diarrhea, similar to hospitalization at [facility] from 3/18 to 3/22 GI infectious workup was negative with negative GI panel negative ova and parasites and negative C. difficile CT abdomen and pelvis showed colitis back in March and now shows mild colitis Patient has not yet followed up with GI. --Completed 5 days Rocephin/Flagyl - GI consulted, see their recs - Stool studies within normal limits - Managed with IV hydration and supportive care -- Stop IV fluids today and monitor for adequate PO hydration - Continue pancreatic enzymes as recommended by GI Underwent EGD/colonoscopy 04/12/2023. EGD showed gastritis and colonoscopy showed pancolitis biopsies were taken.  Also showed polyp that was removed and nonbleeding internal hemorrhoids. --Follow biopsies

## 2023-04-07 NOTE — Assessment & Plan Note (Addendum)
Increased anion gap metabolic acidosis AKI due to hypovolemia, chronic diarrhea Cr on admission 3.17, baseline around 1.2. - Stop IV fluids and monitor - worsening hypoxia --Appreciate Nephrology's input - Monitor and replace electrolytes - Diuretics with discontinued at last discharge from [facility] 3/22 -Symptomatic care with antidiarrheals

## 2023-04-07 NOTE — ED Provider Notes (Signed)
[facility] Provider Note    Event Date/Time   First MD Initiated Contact with Patient 04/07/23 1731     (approximate)   History   Fall (Patient from home - home health was at her house today and patient told her that she has had multiple falls at her house (patient lives with her boyfriend); Home health encouraged patient to come to hospital for further evaluation concerning her falls) and Abdominal Pain (Patient states that she has had chronic abdominal pain, nausea, vomiting, and diarrhea for the past 4 months; Patient says that she has been in the hospital multiple times for the same and has not been able to get any relief)   HPI  Emma Gould is a 67 y.o. female  here with weakness, abdominal pain, n/v. Pt reportedly has had recurrent episodes of n/v and dehydration over hte past several weeks. She has been admitted at [facility] for this previously. She says that over the past few days she has had worsening n/v and has been unable to keep anything down. She has had associated severe burning epigastric discomfort that is worse w/ eating. She has had dark stools, liquidy, as well. No known sick contacts. No recent GI consults.       Physical Exam   Triage Vital Signs: ED Triage Vitals  Enc Vitals Group     BP 04/07/23 1403 (!) 114/98     Pulse Rate 04/07/23 1403 95     Resp 04/07/23 1403 19     Temp 04/07/23 1403 98.4 F (36.9 C)     Temp Source 04/07/23 1403 Oral     SpO2 04/07/23 1403 99 %     Weight 04/07/23 1405 129 lb (58.5 kg)     Height 04/07/23 1405 5\' 3"  (1.6 m)     Head Circumference --      Peak Flow --      Pain Score --      Pain Loc --      Pain Edu? --      Excl. in GC? --     Most recent vital signs: Vitals:   04/07/23 1800 04/07/23 1830  BP: (!) 160/90 (!) 157/87  Pulse: 92 90  Resp: 20 20  Temp:    SpO2: 100% 99%     General: Awake, no distress.  CV:  Good peripheral perfusion. RRR. Resp:  Normal work of breathing. Lungs  clear. Abd:  No distention. Moderate epigastric TTP without rebound or guarding. Hyperactive bowel sounds. Other:  Dry MM.   ED Results / Procedures / Treatments   Labs (all labs ordered are listed, but only abnormal results are displayed) Labs Reviewed  COMPREHENSIVE METABOLIC PANEL - Abnormal; Notable for the following components:      Result Value   CO2 14 (*)    Glucose, Bld 131 (*)    BUN 35 (*)    Creatinine, Ser 3.17 (*)    Total Protein 6.2 (*)    Albumin 2.3 (*)    Alkaline Phosphatase 173 (*)    GFR, Estimated 16 (*)    Anion gap 16 (*)    All other components within normal limits  CBC WITH DIFFERENTIAL/PLATELET - Abnormal; Notable for the following components:   WBC 16.3 (*)    Hemoglobin 15.6 (*)    HCT 47.5 (*)    MCV 103.0 (*)    Neutro Abs 9.6 (*)    Monocytes Absolute 1.4 (*)    Basophils Absolute 0.3 (*)  Abs Immature Granulocytes 1.75 (*)    All other components within normal limits  GASTROINTESTINAL PANEL BY PCR, STOOL (REPLACES STOOL CULTURE)  C DIFFICILE QUICK SCREEN W PCR REFLEX    LIPASE, BLOOD  URINALYSIS, ROUTINE W REFLEX MICROSCOPIC     EKG    RADIOLOGY CT A/P: Minimal diffuse thickening of colon c/f mild colitis   I also independently reviewed and agree with radiologist interpretations.   PROCEDURES:  Critical Care performed: No  .1-3 Lead EKG Interpretation  Performed by: Shaune Pollack, MD Authorized by: Shaune Pollack, MD     Interpretation: non-specific     ECG rate:  90-100   ECG rate assessment: normal     Rhythm: sinus rhythm     Ectopy: none     Conduction: normal   Comments:     Indication: Weakness     MEDICATIONS ORDERED IN ED: Medications  sodium bicarbonate 150 mEq in dextrose 5 % 1,150 mL infusion ( Intravenous New Bag/Given 04/07/23 1839)  lactated ringers bolus 1,000 mL (1,000 mLs Intravenous New Bag/Given 04/07/23 1816)     IMPRESSION / MDM / ASSESSMENT AND PLAN / ED COURSE  I reviewed the  triage vital signs and the nursing notes.                              Differential diagnosis includes, but is not limited to, recurrent n/v from IBS, IBD, colitis, gastritis, hiatal hernia, PUD, with dehydration, anemia  Patient's presentation is most consistent with acute presentation with potential threat to life or bodily function.  The patient is on the cardiac monitor to evaluate for evidence of arrhythmia and/or significant heart rate changes  68 yo F with PMHx  hypertension, fibromyalgia, recurrent n/v/d episodes here with abdominal pain, n/v and weakness. Labs show significant acute kidney injury likely from her GI losses/dehydration. She has a moderate leukocytosis as well which I suspect is from her volume concentration. Mild colitis noted on CT but no abscess or other complications. Will send C. Diff, GI panel. No signs of sepsis. IVF, admit for fluids and colitis management.   Reviewed prior labs - baseline Cr around 1.2   FINAL CLINICAL IMPRESSION(S) / ED DIAGNOSES   Final diagnoses:  AKI (acute kidney injury)  Colitis     Rx / DC Orders   ED Discharge Orders     None        Note:  This document was prepared using Dragon voice recognition software and may include unintentional dictation errors.   Shaune Pollack, MD 04/07/23 [phone]

## 2023-04-07 NOTE — Assessment & Plan Note (Signed)
Continue Topamax and sumatriptan

## 2023-04-07 NOTE — ED Triage Notes (Signed)
Patient from home - home health was at her house today and patient told her that she has had multiple falls at her house (patient lives with her boyfriend); Home health encouraged patient to come to hospital for further evaluation concerning her falls; Patient states that she has had chronic abdominal pain, nausea, vomiting, and diarrhea for the past 4 months; Patient says that she has been in the hospital multiple times for the same and has not been able to get any relief

## 2023-04-07 NOTE — Assessment & Plan Note (Addendum)
Acute on Chronic diastolic CHF (not POA) Echo 12/10/2022 EF 60-65% and RHC revealed diastolic dysfunction  --Hypoxia suspect due to volume overload --Lasix 20 mg IV x 1 today, got dose overnight --Strict I/O's, daily weights --Defer Echo, last in December showed EF 60-65%, indeterminate diastolic parameters, moderate MR, mild-moderate TR, mild AR

## 2023-04-07 NOTE — ED Triage Notes (Signed)
First Nurse Note:  Arrives via [facility].  C/O abdominal pain and generalized weakness.  Symptoms started 2 weeks ago.  Arrives from home. Also c/o frequent falls.  Last fall this morning. Denies head injury. No LOC.  CBG:  186.  T 97.7  BP  149/82.

## 2023-04-07 NOTE — Progress Notes (Signed)
Received pt from ED at 2027. Patient is AAOX4. On RA, Breathing is even and effortless. Assisted to bedside commode and was able to collect urine sample. Complains of discomfort only during movement. Patient was oriented to room and call bell placed within reach.

## 2023-04-07 NOTE — H&P (Signed)
History and Physical    Patient: Emma Gould VHQ:[id number] DOB: 07/05/1956 DOA: 04/24/2023 DOS: the patient was seen and examined on 04/15/2023 PCP: Randel Pigg, Dorma Russell, MD  Patient coming from: Home  Chief Complaint:  Chief Complaint  Patient presents with   Fall    Patient from home - home health was at her house today and patient told her that she has had multiple falls at her house (patient lives with her boyfriend); Home health encouraged patient to come to hospital for further evaluation concerning her falls   Abdominal Pain    Patient states that she has had chronic abdominal pain, nausea, vomiting, and diarrhea for the past 4 months; Patient says that she has been in the hospital multiple times for the same and has not been able to get any relief    HPI: Emma Gould is a 67 y.o. female with medical history significant for hypertension, , CKD llla, nonobstructive CAD, dCHF, anxiety, with a 59-month history of chronic diarrhea, hospitalized early March 2024 at [facility] with critically low potassium of less than 2 and 3/18-3/22 at [facility] for noninfectious colitis complicated by AKI and hypokalemia, discharged on loperamide and Bentyl and referred to GI, who presents to the ED with a 2-week complaint of abdominal pain, generalized weakness, resulting in frequent falls, most recently on the day of arrival, not resulting in any injury.  She denies fever, chills, dysuria and denies cough, chest pain or shortness of breath.  Patient has persistent nausea and intermittent vomiting to where it is difficult to keep anything on and has associated epigastric discomfort.  Vomiting is nonbloody nonbilious and non-coffee-ground.  His stool is dark, liquid.  She has not seen a gastroenterologist for this problem. ED course and data review: Vitals within normal limits.  Labs significant for WBC 16,000, hemoglobin 15, creatinine 3.17, up from baseline of 1.5 with bicarb of 14 and anion gap of 16.  UA pending.  GI  panel and stool for C. difficile pending.  CT abdomen and pelvis shows findings that may represent mild colitis infectious or inflammatory Patient given a fluid bolus started on sodium bicarb and dextrose as well as ceftriaxone and metronidazole. Hospitalist consulted for admission.   Review of Systems: As mentioned in the history of present illness. All other systems reviewed and are negative.  Past Medical History:  Diagnosis Date   Anxiety    CHF (congestive heart failure) (HCC)    CKD (chronic kidney disease)    Depression    Fibromyalgia    Hypertension    Past Surgical History:  Procedure Laterality Date   CERVICAL FUSION     RIGHT/LEFT HEART CATH AND CORONARY ANGIOGRAPHY Bilateral 01/26/2023   Procedure: RIGHT/LEFT HEART CATH AND CORONARY ANGIOGRAPHY;  Surgeon: Dolores Patty, MD;  Location: [facility] INVASIVE CV LAB;  Service: Cardiovascular;  Laterality: Bilateral;   Social History:  reports that she has been smoking cigarettes. She has a 22.50 pack-year smoking history. She has never used smokeless tobacco. She reports that she does not drink alcohol and does not use drugs.  No Known Allergies  Family History  Problem Relation Age of Onset   Stroke Mother    Hypertension Mother    Lung cancer Father        Father died of lung cancer    Prior to Admission medications   Medication Sig Start Date End Date Taking? Authorizing Provider  acetaminophen (TYLENOL) 325 MG tablet Take 2 tablets (650 mg total) by mouth every 6 (  six) hours as needed for mild pain or headache (fever >/= 101). 09/05/19   Lonia BloodMcClung, Jeffrey T, MD  albuterol (VENTOLIN HFA) 108 (90 Base) MCG/ACT inhaler Inhale 2 puffs into the lungs 4 (four) times daily as needed for shortness of breath.    [provider]  carvedilol (COREG) 6.25 MG tablet Take 1 tablet (6.25 mg total) by mouth 2 (two) times daily with a meal. 02/24/23   Sabharwal, Aditya, DO  empagliflozin (JARDIANCE) 10 MG TABS tablet Take 1  tablet (10 mg total) by mouth daily before breakfast. 01/25/23   Bensimhon, Bevelyn Bucklesaniel R, MD  Eszopiclone 3 MG TABS Take 3 mg by mouth at bedtime. 10/07/22   [provider]  ezetimibe (ZETIA) 10 MG tablet Take 10 mg by mouth daily.    [provider]  omeprazole (PRILOSEC) 40 MG capsule Take 40 mg by mouth daily. 06/11/19   [provider]  ondansetron (ZOFRAN-ODT) 8 MG disintegrating tablet Take by mouth. 12/01/22   [provider]  oxyCODONE-acetaminophen (PERCOCET) 10-325 MG tablet Take 1 tablet by mouth 4 (four) times daily as needed for pain. 10/03/22   [provider]  potassium chloride SA (KLOR-CON M) 20 MEQ tablet Take 2 tablets (40 mEq total) by mouth daily. 02/26/23   Enedina FinnerPatel, Sona, MD  QUEtiapine (SEROQUEL) 25 MG tablet Take 4 tablets (100 mg total) by mouth at bedtime. 02/26/23   Enedina FinnerPatel, Sona, MD  sertraline (ZOLOFT) 100 MG tablet Take 100 mg by mouth daily.    [provider]  spironolactone (ALDACTONE) 25 MG tablet Take 12.5 mg by mouth daily.    [provider]  SUMAtriptan (IMITREX) 50 MG tablet Take 50 mg by mouth as needed for migraine. Take a second tablet 2 hours later if needed; max 200mg /day 12/21/18   [provider]  topiramate (TOPAMAX) 100 MG tablet Take 100 mg by mouth 2 (two) times daily.    [provider]  torsemide (DEMADEX) 20 MG tablet TAKE 1 TABLET BY MOUTH EVERY DAY 03/11/23   Dorthula NettlesSabharwal, Aditya, DO    Physical Exam: Vitals:   04/11/2023 1737 04/20/2023 1800 03/30/2023 1830 04/20/2023 1930  BP: (!) 164/97 (!) 160/90 (!) 157/87 (!) 159/79  Pulse: 99 92 90 85  Resp: 15 20 20  (!) 24  Temp:      TempSrc:      SpO2: 100% 100% 99% 100%  Weight:      Height:       Physical Exam Vitals and nursing note reviewed.  Constitutional:      General: She is not in acute distress.    Comments: Frail and chronically ill-appearing  HENT:     Head: Normocephalic and atraumatic.  Cardiovascular:     Rate and  Rhythm: Normal rate and regular rhythm.     Heart sounds: Normal heart sounds.  Pulmonary:     Effort: Pulmonary effort is normal.     Breath sounds: Normal breath sounds.  Abdominal:     Palpations: Abdomen is soft.     Tenderness: There is no abdominal tenderness.  Neurological:     Mental Status: Mental status is at baseline.     Labs on Admission: I have personally reviewed following labs and imaging studies  CBC: Recent Labs  Lab 04/25/2023 1415  WBC 16.3*  NEUTROABS 9.6*  HGB 15.6*  HCT 47.5*  MCV 103.0*  PLT 199   Basic Metabolic Panel: Recent Labs  Lab 04/17/2023 1415  NA 135  K 3.7  CL  105  CO2 14*  GLUCOSE 131*  BUN 35*  CREATININE 3.17*  CALCIUM 8.9   GFR: Estimated Creatinine Clearance: 14.8 mL/min (A) (by C-G formula based on SCr of 3.17 mg/dL (H)). Liver Function Tests: Recent Labs  Lab 04/25/2023 1415  AST 38  ALT 19  ALKPHOS 173*  BILITOT 0.8  PROT 6.2*  ALBUMIN 2.3*   Recent Labs  Lab 04/25/2023 1415  LIPASE 27   No results for input(s): "AMMONIA" in the last 168 hours. Coagulation Profile: No results for input(s): "INR", "PROTIME" in the last 168 hours. Cardiac Enzymes: No results for input(s): "CKTOTAL", "CKMB", "CKMBINDEX", "TROPONINI" in the last 168 hours. BNP (last 3 results) No results for input(s): "PROBNP" in the last 8760 hours. HbA1C: No results for input(s): "HGBA1C" in the last 72 hours. CBG: No results for input(s): "GLUCAP" in the last 168 hours. Lipid Profile: No results for input(s): "CHOL", "HDL", "LDLCALC", "TRIG", "CHOLHDL", "LDLDIRECT" in the last 72 hours. Thyroid Function Tests: No results for input(s): "TSH", "T4TOTAL", "FREET4", "T3FREE", "THYROIDAB" in the last 72 hours. Anemia Panel: No results for input(s): "VITAMINB12", "FOLATE", "FERRITIN", "TIBC", "IRON", "RETICCTPCT" in the last 72 hours. Urine analysis:    Component Value Date/Time   COLORURINE YELLOW (A) 02/25/2023 0545   APPEARANCEUR CLEAR (A)  02/25/2023 0545   LABSPEC 1.013 02/25/2023 0545   PHURINE 6.0 02/25/2023 0545   GLUCOSEU 50 (A) 02/25/2023 0545   HGBUR NEGATIVE 02/25/2023 0545   BILIRUBINUR NEGATIVE 02/25/2023 0545   KETONESUR NEGATIVE 02/25/2023 0545   PROTEINUR NEGATIVE 02/25/2023 0545   NITRITE NEGATIVE 02/25/2023 0545   LEUKOCYTESUR NEGATIVE 02/25/2023 0545    Radiological Exams on Admission: CT ABDOMEN PELVIS WO CONTRAST  Result Date: 04/03/2023 CLINICAL DATA:  Abdominal pain. EXAM: CT ABDOMEN AND PELVIS WITHOUT CONTRAST TECHNIQUE: Multidetector CT imaging of the abdomen and pelvis was performed following the standard protocol without IV contrast. RADIATION DOSE REDUCTION: This exam was performed according to the departmental dose-optimization program which includes automated exposure control, adjustment of the mA and/or kV according to patient size and/or use of iterative reconstruction technique. COMPARISON:  October 30, 2022 FINDINGS: Lower chest: Mild lung base scarring. Hepatobiliary: No focal liver abnormality is seen. No gallstones, gallbladder wall thickening, or biliary dilatation. Pancreas: Unremarkable. No pancreatic ductal dilatation or surrounding inflammatory changes. Spleen: Normal in size without focal abnormality. Adrenals/Urinary Tract: Adrenal glands are unremarkable. Kidneys are normal, without renal calculi, focal lesion, or hydronephrosis. Bladder is unremarkable. Stomach/Bowel: Normal stomach and small bowel. Somewhat featureless appearance of the colon with minimal diffuse circumferential mucosal thickening and gaseous distension of the transverse colon. Vascular/Lymphatic: Aortic atherosclerosis. No enlarged abdominal or pelvic lymph nodes. Reproductive: Status post hysterectomy. No adnexal masses. Other: No abdominal wall hernia or abnormality. No abdominopelvic ascites. Musculoskeletal: No acute or significant osseous findings. IMPRESSION: 1. Somewhat featureless appearance of the colon with minimal  diffuse circumferential mucosal thickening. Findings may represent mild colitis, infectious or inflammatory. 2. Aortic atherosclerosis. Aortic Atherosclerosis (ICD10-I70.0). Electronically Signed   By: Ted Mcalpine M.D.   On: 04/15/2023 18:24     Data Reviewed: Relevant notes from primary care and specialist visits, past discharge summaries as available in EHR, including Care Everywhere. Prior diagnostic testing as pertinent to current admission diagnoses Updated medications and problem lists for reconciliation ED course, including vitals, labs, imaging, treatment and response to treatment Triage notes, nursing and pharmacy notes and ED provider's notes Notable results as noted in HPI   Assessment and Plan: Acute colitis Chronic diarrhea of  unknown origin Patient presents with nausea vomiting and diarrhea, similar to hospitalization at [facility] from 3/18 to 3/22 GI infectious workup was negative with negative GI panel negative ova and parasites and negative C. difficile CT abdomen and pelvis showed colitis back in March and now shows mild colitis Patient has not yet followed up with GI - GI consulted for consideration of inpatient colonoscopy given possibility of intolerance of bowel prep - Follow-up stool studies - IV hydration and supportive care - Patient was discharged from [facility] with loperamide and Bentyl which can be continued if tolerating orally  Acute renal failure superimposed on stage 3a chronic kidney disease Increased anion gap metabolic acidosis Secondary to chronic diarrhea - Continue IV hydration with sodium bicarb with dextrose - Monitor and replace electrolytes - Diuretics with discontinued at last discharge from [facility] 3/22 -Symptomatic care with antidiarrheals  Generalized weakness Protein calorie malnutrition, suspected Frequent falls Secondary to ongoing chronic diarrhea, poor oral intake - Dietary consult - PT evaluation   Chronic diastolic CHF (congestive  heart failure) Echo 12/10/2022 EF 60-65% and RHC revealed diastolic dysfunction  Monitor for fluid overload in view of IV hydration  Coronary artery disease involving native coronary artery of native heart with angina pectoris History of nonobstructive CAD Continue carvedilol and ezetimibe  Migraine Continue Topamax and sumatriptan  Depression Continue sertraline     DVT prophylaxis: Lovenox  Consults: Gi Dr, Servando Snare  Advance Care Planning:   Code Status: Prior   Family Communication: none  Disposition Plan: Back to previous home environment  Severity of Illness: The appropriate patient status for this patient is INPATIENT. Inpatient status is judged to be reasonable and necessary in order to provide the required intensity of service to ensure the patient's safety. The patient's presenting symptoms, physical exam findings, and initial radiographic and laboratory data in the context of their chronic comorbidities is felt to place them at high risk for further clinical deterioration. Furthermore, it is not anticipated that the patient will be medically stable for discharge from the hospital within 2 midnights of admission.   * I certify that at the point of admission it is my clinical judgment that the patient will require inpatient hospital care spanning beyond 2 midnights from the point of admission due to high intensity of service, high risk for further deterioration and high frequency of surveillance required.*  Author: Andris Baumann, MD 04/13/2023 7:49 PM  For on call review [email or web address].

## 2023-04-07 NOTE — Assessment & Plan Note (Addendum)
History of nonobstructive CAD Continue carvedilol and ezetimibe

## 2023-04-07 NOTE — Assessment & Plan Note (Addendum)
Protein calorie malnutrition, suspected Frequent falls Secondary to ongoing chronic diarrhea, poor oral intake - Dietary consult - PT evaluation

## 2023-04-07 NOTE — ED Notes (Signed)
Pt up to the bathroom with walker and 1 assist to provide urine and stool samples. Pt unable to provide either. Pt back to bed resting comfortably.

## 2023-04-07 NOTE — Assessment & Plan Note (Signed)
Continue sertraline 

## 2023-04-07 NOTE — ED Notes (Signed)
Pt to CT

## 2023-04-08 ENCOUNTER — Encounter: Payer: Self-pay | Admitting: Anesthesiology

## 2023-04-08 ENCOUNTER — Encounter: Payer: Self-pay | Admitting: Internal Medicine

## 2023-04-08 DIAGNOSIS — N179 Acute kidney failure, unspecified: Principal | ICD-10-CM

## 2023-04-08 DIAGNOSIS — K529 Noninfective gastroenteritis and colitis, unspecified: Secondary | ICD-10-CM | POA: Diagnosis not present

## 2023-04-08 LAB — GASTROINTESTINAL PANEL BY PCR, STOOL (REPLACES STOOL CULTURE)

## 2023-04-08 LAB — CBC
HCT: 36.3 % (ref 36.0–46.0)
Hemoglobin: 12.4 g/dL (ref 12.0–15.0)
MCH: 33.8 pg (ref 26.0–34.0)
MCHC: 34.2 g/dL (ref 30.0–36.0)
MCV: 98.9 fL (ref 80.0–100.0)
Platelets: 148 10*3/uL — ABNORMAL LOW (ref 150–400)
RBC: 3.67 MIL/uL — ABNORMAL LOW (ref 3.87–5.11)
RDW: 13.8 % (ref 11.5–15.5)
WBC: 13.4 10*3/uL — ABNORMAL HIGH (ref 4.0–10.5)
nRBC: 0.1 % (ref 0.0–0.2)

## 2023-04-08 LAB — HEMOGLOBIN A1C
Hgb A1c MFr Bld: 5.1 % (ref 4.8–5.6)
Mean Plasma Glucose: 99.67 mg/dL

## 2023-04-08 LAB — POTASSIUM: Potassium: 3.2 mmol/L — ABNORMAL LOW (ref 3.5–5.1)

## 2023-04-08 LAB — GLUCOSE, CAPILLARY
Glucose-Capillary: 122 mg/dL — ABNORMAL HIGH (ref 70–99)
Glucose-Capillary: 98 mg/dL (ref 70–99)
Glucose-Capillary: 106 mg/dL — ABNORMAL HIGH (ref 70–99)

## 2023-04-08 LAB — COMPREHENSIVE METABOLIC PANEL
ALT: 15 U/L (ref 0–44)
AST: 34 U/L (ref 15–41)
Albumin: 1.9 g/dL — ABNORMAL LOW (ref 3.5–5.0)
Alkaline Phosphatase: 141 U/L — ABNORMAL HIGH (ref 38–126)
Anion gap: 11 (ref 5–15)
BUN: 34 mg/dL — ABNORMAL HIGH (ref 8–23)
CO2: 27 mmol/L (ref 22–32)
Calcium: 7.2 mg/dL — ABNORMAL LOW (ref 8.9–10.3)
Chloride: 94 mmol/L — ABNORMAL LOW (ref 98–111)
Creatinine, Ser: 2.86 mg/dL — ABNORMAL HIGH (ref 0.44–1.00)
GFR, Estimated: 18 mL/min — ABNORMAL LOW (ref >60)
Glucose, Bld: 90 mg/dL (ref 70–99)
Potassium: 2.4 mmol/L — CL (ref 3.5–5.1)
Sodium: 132 mmol/L — ABNORMAL LOW (ref 135–145)
Total Bilirubin: 0.6 mg/dL (ref 0.3–1.2)
Total Protein: 4.9 g/dL — ABNORMAL LOW (ref 6.5–8.1)

## 2023-04-08 LAB — MAGNESIUM: Magnesium: 1.2 mg/dL — ABNORMAL LOW (ref 1.7–2.4)

## 2023-04-08 LAB — C DIFFICILE QUICK SCREEN W PCR REFLEX
C Diff antigen: NEGATIVE
C Diff interpretation: NOT DETECTED
C Diff toxin: NEGATIVE

## 2023-04-08 MED ORDER — PANCRELIPASE (LIP-PROT-AMYL) 12000-38000 UNITS PO CPEP
72000.0000 [IU] | ORAL_CAPSULE | Freq: Three times a day (TID) | ORAL | Status: DC
  Administered 2023-04-11 – 2023-04-13 (×5): 72000 [IU] via ORAL
  Filled 2023-04-08 (×8): qty 6

## 2023-04-08 MED ORDER — BOOST / RESOURCE BREEZE PO LIQD CUSTOM
1.0000 | Freq: Three times a day (TID) | ORAL | Status: DC
  Administered 2023-04-11 – 2023-04-14 (×6): 1 via ORAL

## 2023-04-08 MED ORDER — VITAMIN C 500 MG PO TABS
500.0000 mg | ORAL_TABLET | Freq: Two times a day (BID) | ORAL | Status: DC
  Administered 2023-04-09 – 2023-04-14 (×10): 500 mg via ORAL
  Filled 2023-04-08 (×10): qty 1

## 2023-04-08 MED ORDER — SODIUM CHLORIDE 0.9 % IV SOLN
2.0000 g | INTRAVENOUS | Status: DC
  Administered 2023-04-08 – 2023-04-11 (×4): 2 g via INTRAVENOUS
  Filled 2023-04-08: qty 20
  Filled 2023-04-08 (×2): qty 2
  Filled 2023-04-08 (×2): qty 20

## 2023-04-08 MED ORDER — POTASSIUM CHLORIDE CRYS ER 20 MEQ PO TBCR
40.0000 meq | EXTENDED_RELEASE_TABLET | ORAL | Status: AC
  Administered 2023-04-08 (×3): 40 meq via ORAL
  Filled 2023-04-08 (×3): qty 2

## 2023-04-08 MED ORDER — ENOXAPARIN SODIUM 30 MG/0.3ML IJ SOSY
30.0000 mg | PREFILLED_SYRINGE | Freq: Every day | INTRAMUSCULAR | Status: DC
  Administered 2023-04-08 – 2023-04-11 (×4): 30 mg via SUBCUTANEOUS
  Filled 2023-04-08 (×4): qty 0.3

## 2023-04-08 MED ORDER — POLYETHYLENE GLYCOL 3350 17 GM/SCOOP PO POWD
1.0000 | Freq: Once | ORAL | Status: AC
  Administered 2023-04-08: 255 g via ORAL
  Filled 2023-04-08: qty 255

## 2023-04-08 MED ORDER — ADULT MULTIVITAMIN W/MINERALS CH
1.0000 | ORAL_TABLET | Freq: Every day | ORAL | Status: DC
  Administered 2023-04-10 – 2023-04-14 (×4): 1 via ORAL
  Filled 2023-04-08 (×4): qty 1

## 2023-04-08 MED ORDER — PEG 3350-KCL-NA BICARB-NACL 420 G PO SOLR: 4000.0000 mL | Freq: Once | ORAL | Status: DC

## 2023-04-08 NOTE — Progress Notes (Signed)
Progress Note   Patient: Emma Gould EXH:[id number] DOB: 10/27/1956 DOA: 04/01/2023     1 DOS: the patient was seen and examined on 04/08/2023   Subjective:  She tells me her abdominal pain is 9/10 Awaiting pain medications Denies nausea or vomiting   Brief hospital course:  Penellope Kilson is a 67 y.o. female with medical history significant for hypertension, , CKD llla, nonobstructive CAD, dCHF, anxiety, with a 66-month history of chronic diarrhea, hospitalized early March 2024 at [facility] with critically low potassium of less than 2 and 3/18-3/22 at [facility] for noninfectious colitis complicated by AKI and hypokalemia, discharged on loperamide and Bentyl and referred to GI, who presents to the ED with a 2-week complaint of abdominal pain, generalized weakness, resulting in frequent falls, most recently on the day of arrival, not resulting in any injury.  She denies fever, chills, dysuria and denies cough, chest pain or shortness of breath.  Patient has persistent nausea and intermittent vomiting to where it is difficult to keep anything on and has associated epigastric discomfort.  Vomiting is nonbloody nonbilious and non-coffee-ground.  His stool is dark, liquid.  She has not seen a gastroenterologist for this problem. In the emergency room CT abdomen and pelvis shows findings that represent mild colitis infectious or inflammatory.  Assessment and Plan:  Acute colitis Chronic diarrhea of unknown origin Patient presents with nausea vomiting and diarrhea, similar to hospitalization at [facility] from 3/18 to 3/22 GI infectious workup was negative with negative GI panel negative ova and parasites and negative C. difficile CT abdomen and pelvis showed colitis back in March and now shows mild colitis Patient has not yet followed up with GI - GI on board we appreciate input - Follow-up stool studies - IV hydration and supportive care - GI has initiated patient on pancreatic enzyme trial empirically Patient  being planned for EGD/colonoscopy tomorrow by GI   Acute renal failure superimposed on stage 3a chronic kidney disease Increased anion gap metabolic acidosis Secondary to chronic diarrhea - Continue IV hydration with sodium bicarb with dextrose - Monitor and replace electrolytes - Diuretics with discontinued at last discharge from [facility] 3/22 -Symptomatic care with antidiarrheals   Generalized weakness Protein calorie malnutrition, suspected Frequent falls Secondary to ongoing chronic diarrhea, poor oral intake - Dietary consult - PT evaluation   Severe hypokalemia-continue repletion and monitoring   Chronic diastolic CHF (congestive heart failure) Echo 12/10/2022 EF 60-65% and RHC revealed diastolic dysfunction  Monitor for fluid overload in view of IV hydration   Coronary artery disease involving native coronary artery of native heart with angina pectoris History of nonobstructive CAD Continue carvedilol and ezetimibe   Migraine Continue Topamax and sumatriptan   Depression Continue sertraline      DVT prophylaxis: Lovenox   Consults: Gastroenterology   Advance Care Planning:   Code Status: Prior          Physical Exam: Vitals and nursing note reviewed.  Constitutional:      General: She is not in acute distress.    Comments: Frail and chronically ill-appearing  HENT:     Head: Normocephalic and atraumatic.  Cardiovascular:     Rate and Rhythm: Normal rate and regular rhythm.     Heart sounds: Normal heart sounds.  Pulmonary:     Effort: Pulmonary effort is normal.     Breath sounds: Normal breath sounds.  Abdominal:     Palpations: Abdomen is soft.     Tenderness: There is no abdominal tenderness.  Neurological:  Mental Status: Mental status is at baseline.    Vitals:   04/08/23 0433 04/08/23 0745 04/08/23 1637 04/08/23 1715  BP: 126/62 138/69 114/65   Pulse: 77 77 83   Resp: Temp: 98.1 F (36.7 C) 98 F (36.7 C) 98.4 F (36.9 C)    TempSrc:      SpO2: 100% 97% 96%   Weight:    64.2 kg  Height:        Data Reviewed: Laboratory results reviewed by me showing potassium 2.4  Family Communication: None present at bedside at this time  Disposition: Status is: Inpatient Patient still continues to meet inpatient criteria given current IV medication needed as well as requiring GI consultation for EGD/colonoscopy   Time spent: 55 minutes  Author: Loyce Dys, MD 04/08/2023 5:19 PM  For on call review [email or web address].

## 2023-04-08 NOTE — Consult Note (Signed)
Arlyss Repress, MD [address]  [address]  [place], [place] [address]  Main: [phone]  Fax: [phone] Pager: [phone]   Consultation  Referring Provider:     No ref. provider found Primary Care Physician:  Randel Pigg, Dorma Russell, MD Primary Gastroenterologist: Gentry Fitz     Reason for Consultation: Chronic diarrhea  Date of Admission:  04/02/2023 Date of Consultation:  04/08/2023         HPI:   Emma Gould is a 67 y.o. female history of CKD, CHF, hypertension is admitted with generalized abdominal pain, nausea, weakness, history of frequent falls.  Patient was recently admitted about a month ago secondary to hypokalemia abdominal pain as well as vomiting.  Hypokalemia was thought to be secondary to diuretics.  Patient has history of heavy tobacco use since age of 5, smokes about half pack to pack a day daily, almost about to quit smoking.  She reports abdominal bloating, generalized abdominal discomfort, 5-6 episodes of liquid nonbloody bowel movements most of the days in a week, associated with nausea, 30 pound weight loss.  The symptoms have been ongoing for few years, worse within last 1 year.  Labs during this admission revealed AKI on CKD, normal potassium, leukocytosis, macrocytosis, normal lipase, HbA1c, stool for C. difficile and GI pathogen panel came back negative. CT abdomen and pelvis without contrast during this admission revealed somewhat featureless appearance of the colon with minimal diffuse circumferential mucosal thickening.  Therefore, GI is consulted for further evaluation.  Patient is empirically started on ceftriaxone and metronidazole for possible infectious colitis/enteritis  NSAIDs: None  Antiplts/Anticoagulants/Anti thrombotics: None  GI Procedures: Colonoscopy in 2017, adenomatous polyp was resected  Past Medical History:  Diagnosis Date   Anxiety    CHF (congestive heart failure)    CKD (chronic kidney disease)    Depression     Fibromyalgia    Hypertension     Past Surgical History:  Procedure Laterality Date   CERVICAL FUSION     RIGHT/LEFT HEART CATH AND CORONARY ANGIOGRAPHY Bilateral 01/26/2023   Procedure: RIGHT/LEFT HEART CATH AND CORONARY ANGIOGRAPHY;  Surgeon: Dolores Patty, MD;  Location: [facility] INVASIVE CV LAB;  Service: Cardiovascular;  Laterality: Bilateral;     Current Facility-Administered Medications:    acetaminophen (TYLENOL) tablet 650 mg, 650 mg, Oral, Q6H PRN **OR** acetaminophen (TYLENOL) suppository 650 mg, 650 mg, Rectal, Q6H PRN, Andris Baumann, MD   carvedilol (COREG) tablet 6.25 mg, 6.25 mg, Oral, BID WC, Lindajo Royal V, MD, 6.25 mg at 04/08/23 0836   cefTRIAXone (ROCEPHIN) 2 g in sodium chloride 0.9 % 100 mL IVPB, 2 g, Intravenous, Q24H, Djan, Prince T, MD   enoxaparin (LOVENOX) injection 30 mg, 30 mg, Subcutaneous, QHS, Djan, Prince T, MD   ezetimibe (ZETIA) tablet 10 mg, 10 mg, Oral, Daily, Lindajo Royal V, MD, 10 mg at 04/08/23 3016   lipase/protease/amylase (CREON) capsule 72,000 Units, 72,000 Units, Oral, TID WC, Kaylub Detienne, Loel Dubonnet, MD   metroNIDAZOLE (FLAGYL) IVPB 500 mg, 500 mg, Intravenous, Q8H, Shaune Pollack, MD, Last Rate: 100 mL/hr at 04/08/23 1108, 500 mg at 04/08/23 1108   ondansetron (ZOFRAN) tablet 4 mg, 4 mg, Oral, Q6H PRN **OR** ondansetron (ZOFRAN) injection 4 mg, 4 mg, Intravenous, Q6H PRN, Lindajo Royal V, MD, 4 mg at 04/08/23 1110   oxyCODONE-acetaminophen (PERCOCET/ROXICET) 5-325 MG per tablet 2 tablet, 2 tablet, Oral, QID PRN, Andris Baumann, MD, 2 tablet at 04/08/23 1108   pantoprazole (PROTONIX) EC tablet 40 mg, 40  mg, Oral, Daily, Lindajo Royal V, MD, 40 mg at 04/08/23 0836   polyethylene glycol powder (GLYCOLAX/MIRALAX) container 255 g, 1 Container, Oral, Once, Aela Bohan, Loel Dubonnet, MD   potassium chloride SA (KLOR-CON M) CR tablet 40 mEq, 40 mEq, Oral, Q4H, Djan, Prince T, MD, 40 mEq at 04/08/23 1328   QUEtiapine (SEROQUEL) tablet 50 mg, 50 mg, Oral,  BID, Andris Baumann, MD, 50 mg at 04/08/23 0836   sertraline (ZOLOFT) tablet 100 mg, 100 mg, Oral, Daily, Lindajo Royal V, MD, 100 mg at 04/08/23 1610   spironolactone (ALDACTONE) tablet 12.5 mg, 12.5 mg, Oral, Daily, Lindajo Royal V, MD, 12.5 mg at 04/08/23 9604   SUMAtriptan (IMITREX) tablet 50 mg, 50 mg, Oral, PRN, Andris Baumann, MD   topiramate (TOPAMAX) tablet 50 mg, 50 mg, Oral, BID, Andris Baumann, MD, 50 mg at 04/08/23 5409   Family History  Problem Relation Age of Onset   Stroke Mother    Hypertension Mother    Lung cancer Father        Father died of lung cancer     Social History   Tobacco Use   Smoking status: Every Day    Packs/day: 0.50    Years: 45.00    Additional pack years: 0.00    Total pack years: 22.50    Types: Cigarettes   Smokeless tobacco: Never  Substance Use Topics   Alcohol use: Never   Drug use: Never    Allergies as of 04/22/2023   (No Known Allergies)    Review of Systems:    All systems reviewed and negative except where noted in HPI.   Physical Exam:  Vital signs in last 24 hours: Temp:  [98 F (36.7 C)-98.2 F (36.8 C)] 98 F (36.7 C) (04/12 0745) Pulse Rate:  [77-99] 77 (04/12 0745) Resp:  [15-24] 17 (04/12 0745) BP: (126-164)/(62-97) 138/69 (04/12 0745) SpO2:  [97 %-100 %] 97 % (04/12 0745) Last BM Date : 04/08/23 General:   Pleasant, cooperative in NAD Head:  Normocephalic and atraumatic. Eyes:   No icterus.   Conjunctiva pink. PERRLA. Ears:  Normal auditory acuity. Neck:  Supple; no masses or thyroidomegaly Lungs: Respirations even and unlabored. Lungs clear to auscultation bilaterally.   No wheezes, crackles, or rhonchi.  Heart:  Regular rate and rhythm;  Without murmur, clicks, rubs or gallops Abdomen:  Soft, nondistended, nontender. Normal bowel sounds. No appreciable masses or hepatomegaly.  No rebound or guarding.  Rectal:  Not performed. Msk:  Symmetrical without gross deformities.  Strength generalized  weakness Extremities:  Without edema, cyanosis or clubbing. Neurologic:  Alert and oriented x3;  grossly normal neurologically. Skin:  Intact without significant lesions or rashes. Psych:  Alert and cooperative. Normal affect.  LAB RESULTS:    Latest Ref Rng & Units 04/08/2023    5:23 AM 04/05/2023    2:15 PM 02/25/2023    4:12 AM  CBC  WBC 4.0 - 10.5 K/uL 13.4  16.3  12.2   Hemoglobin 12.0 - 15.0 g/dL 81.1  91.4  78.2   Hematocrit 36.0 - 46.0 % 36.3  47.5  41.0   Platelets 150 - 400 K/uL 148  199  208     BMET    Latest Ref Rng & Units 04/08/2023    5:23 AM 04/25/2023    2:15 PM 02/26/2023    1:56 PM  BMP  Glucose 70 - 99 mg/dL 90  956    BUN 8 - 23 mg/dL 34  35    Creatinine 0.44 - 1.00 mg/dL 2.72  5.36    Sodium 644 - 145 mmol/L 132  135    Potassium 3.5 - 5.1 mmol/L 2.4  3.7  3.8   Chloride 98 - 111 mmol/L 94  105    CO2 22 - 32 mmol/L 27  14    Calcium 8.9 - 10.3 mg/dL 7.2  8.9      LFT    Latest Ref Rng & Units 04/08/2023    5:23 AM 04/25/2023    2:15 PM 02/25/2023    4:12 AM  Hepatic Function  Total Protein 6.5 - 8.1 g/dL 4.9  6.2  5.3   Albumin 3.5 - 5.0 g/dL 1.9  2.3  2.5   AST 15 - 41 U/L 34  38  13   ALT 0 - 44 U/L Alk Phosphatase 38 - 126 U/L 141  173  67   Total Bilirubin 0.3 - 1.2 mg/dL 0.6  0.8  0.4      STUDIES: CT ABDOMEN PELVIS WO CONTRAST  Result Date: 04/22/2023 CLINICAL DATA:  Abdominal pain. EXAM: CT ABDOMEN AND PELVIS WITHOUT CONTRAST TECHNIQUE: Multidetector CT imaging of the abdomen and pelvis was performed following the standard protocol without IV contrast. RADIATION DOSE REDUCTION: This exam was performed according to the departmental dose-optimization program which includes automated exposure control, adjustment of the mA and/or kV according to patient size and/or use of iterative reconstruction technique. COMPARISON:  October 30, 2022 FINDINGS: Lower chest: Mild lung base scarring. Hepatobiliary: No focal liver abnormality is seen.  No gallstones, gallbladder wall thickening, or biliary dilatation. Pancreas: Unremarkable. No pancreatic ductal dilatation or surrounding inflammatory changes. Spleen: Normal in size without focal abnormality. Adrenals/Urinary Tract: Adrenal glands are unremarkable. Kidneys are normal, without renal calculi, focal lesion, or hydronephrosis. Bladder is unremarkable. Stomach/Bowel: Normal stomach and small bowel. Somewhat featureless appearance of the colon with minimal diffuse circumferential mucosal thickening and gaseous distension of the transverse colon. Vascular/Lymphatic: Aortic atherosclerosis. No enlarged abdominal or pelvic lymph nodes. Reproductive: Status post hysterectomy. No adnexal masses. Other: No abdominal wall hernia or abnormality. No abdominopelvic ascites. Musculoskeletal: No acute or significant osseous findings. IMPRESSION: 1. Somewhat featureless appearance of the colon with minimal diffuse circumferential mucosal thickening. Findings may represent mild colitis, infectious or inflammatory. 2. Aortic atherosclerosis. Aortic Atherosclerosis (ICD10-I70.0). Electronically Signed   By: Ted Mcalpine M.D.   On: 04/03/2023 18:24      Impression / Plan:   Emma Gould is a 67 y.o. female with history of chronic tobacco use, CHF, CKD, hypertension is admitted with generalized weakness, chronic diarrhea and unintentional weight loss  Stool studies came back negative for infection including C. difficile Recommend to check pancreatic fecal elastase levels, fecal calprotectin levels, celiac disease panel Given history of heavy tobacco use, ?exocrine pancreatic insufficiency Recommend empiric trial of pancreatic enzymes Recommend EGD and colonoscopy with possible TI evaluation, gastric, duodenal biopsies, random colon biopsies Okay to discontinue antibiotics from GI standpoint Clear liquid diet N.p.o. effective 5 AM tomorrow MiraLAX bowel prep ordered   I have discussed alternative  options, risks & benefits,  which include, but are not limited to, bleeding, infection, perforation,respiratory complication & drug reaction.  The patient agrees with this plan & written consent will be obtained.     Thank you for involving me in the care of this patient.  Dr. Tobi Bastos will cover for the weekend    LOS: 1 day  Lannette Donath, MD  04/08/2023, 3:30 PM    Note: This dictation was prepared with Dragon dictation along with smaller phrase technology. Any transcriptional errors that result from this process are unintentional.

## 2023-04-08 NOTE — Progress Notes (Addendum)
Initial Nutrition Assessment  DOCUMENTATION CODES:   Not applicable  INTERVENTION:   Boost Breeze po TID, each supplement provides 250 kcal and 9 grams of protein  MVI po daily  Vitamin C 500mg  po BID   Pt at high refeed risk; recommend monitor potassium, magnesium and phosphorus labs daily until stable  Daily weights   Check vitamins B12, B3, E, folate, A, D, C and zinc labs  NUTRITION DIAGNOSIS:   Inadequate oral intake related to acute illness as evidenced by per patient/family report.  GOAL:   Patient will meet greater than or equal to 90% of their needs  MONITOR:   PO intake, Supplement acceptance, Diet advancement, Labs, Weight trends, I & O's, Skin  REASON FOR ASSESSMENT:   Malnutrition Screening Tool    ASSESSMENT:   67 y/o female with h/o depression, anxiety, CKD III, CAD, CHF, HTN, GERD, ischemic colitis, CVA, DDD, hiatal hernia and chronic diarrhea who is admitted with generalized abdominal pain, diarrhea, nausea, weakness and frequent falls.  Met with pt in room today. Pt reports poor appetite and oral intake for several months pta. Pt reports that she has been having chronic diarrhea and nausea for the past 6 months but reports this has been getting worse over the past 2 months. Pt reports that she has been unable to keep down much food. Pt reports gagging when she tries to swallow. Pt also reports taste changes. Pt has not been able to drink supplements as they are "too thick". Pt reports ongoing nausea today. Pt has not eaten anything today but has been initiated on a liquid diet. RD will add supplements and vitamins to help pt meet her estimated needs. Pt is at high refeed risk. Per chart, pt is down 11lbs(7%) over the past 2 months; this is significant.   Pt believes that her diarrhea was made worse with the oral potassium. Pt denies any h/o surgical resections of her GI system. Pt denies any extended periods of antibiotics recently. RD will check vitamin  labs where deficiency can lead to diarrhea. Pt does have a h/o B12 and vitamin D deficiency. Pt reports that she did take B12 injections until January. Pt is being followed by GI. Pt initiated on creon. Plan is for EGD tomorrow.   Medications reviewed and include: lovenox, creon, protonix, Kcl, aldactone, ceftriaxone, metronidazole   Labs reviewed: Na 132(L), K 2.4(L), BUN 34(H), creat 2.86(H), Mg 1.2(L) Wbc- 13.4(H) Cbgs- 106, 98, 122 x 24 hrs  AIC 5.1- 4/11  NUTRITION - FOCUSED PHYSICAL EXAM:  Flowsheet Row Most Recent Value  Orbital Region No depletion  Upper Arm Region No depletion  Thoracic and Lumbar Region No depletion  Buccal Region No depletion  Temple Region No depletion  Clavicle Bone Region Mild depletion  Clavicle and Acromion Bone Region Mild depletion  Scapular Bone Region No depletion  Dorsal Hand Mild depletion  Patellar Region Mild depletion  Anterior Thigh Region No depletion  Posterior Calf Region No depletion  Edema (RD Assessment) None  Hair Reviewed  Eyes Reviewed  Mouth Reviewed  Skin Reviewed  Nails Reviewed   Diet Order:   Diet Order             Diet clear liquid Fluid consistency: Thin  Diet effective now                  EDUCATION NEEDS:   Education needs have been addressed  Skin:  Skin Assessment: Reviewed RN Assessment (ecchymosis)  Last BM:  4/12-  type 7  Height:   Ht Readings from Last 1 Encounters:  04/25/2023  (1.6 m)    Weight:   Wt Readings from Last 1 Encounters:  04/25/2023 58.5 kg    Ideal Body Weight:  52 kg  BMI:  Body mass index is 22.85 kg/m.  Estimated Nutritional Needs:   Kcal:  1500-1700kcal/day  Protein:  75-85g/day  Fluid:  1.5-1.7L/day  Betsey Holiday MS, RD, LDN Please refer to [facility] for RD and/or RD on-call/weekend/after hours pager

## 2023-04-08 NOTE — TOC CM/SW Note (Signed)
Went by room for readmission prevention screen but patient had to use the bathroom. Will try again later.  Charlynn Court, CSW [phone]

## 2023-04-08 NOTE — Progress Notes (Signed)
   04/08/23 1300  Spiritual Encounters  Type of Visit Initial  Care provided to: Patient  Referral source Patient request  Reason for visit Religious ritual  OnCall Visit Yes  Spiritual Framework  Presenting Themes Rituals and practive  Patient Stress Factors Major life changes  Interventions  Spiritual Care Interventions Made Established relationship of care and support;Reflective listening;Compassionate presence;Prayer  Spiritual Care Plan  Spiritual Care Issues Still Outstanding No further spiritual care needs at this time (see row info)   Patient requested prayer I pray with patient. Advise Patient if she need Chaplain we are here 24/7. Patient was dealing with the stress of her sickness and wants to be heal.

## 2023-04-08 NOTE — Anesthesia Preprocedure Evaluation (Deleted)
Anesthesia Evaluation  Patient identified by MRN, date of birth, ID band Patient awake    Reviewed: Allergy & Precautions, H&P , NPO status , Patient's Chart, lab work & pertinent test results  History of Anesthesia Complications Negative for: history of anesthetic complications  Airway Mallampati: III  TM Distance: >3 FB Neck ROM: full    Dental  (+) Dental Advidsory Given, Edentulous Upper, Edentulous Lower   Pulmonary shortness of breath and with exertion, COPD, neg recent URI, Current Smoker smokes about half pack to pack a day daily   Pulmonary exam normal        Cardiovascular hypertension, (-) angina + CAD (nonobstructive CAD) and +CHF (Chronic diastolic CHF)  (-) Past MI, (-) Cardiac Stents and (-) CABG Normal cardiovascular exam(-) dysrhythmias (-) Valvular Problems/Murmurs  Echo 12/10/2022 EF 60-65% and RHC revealed diastolic dysfunction    Neuro/Psych neg Seizures PSYCHIATRIC DISORDERS Anxiety Depression    S/p cervical fusion TIA Neuromuscular disease    GI/Hepatic Neg liver ROS,GERD  ,,Acute colitis Chronic diarrhea of unknown origin    Endo/Other  negative endocrine ROS    Renal/GU Renal InsufficiencyRenal diseaseAcute renal failure superimposed on stage 3a chronic kidney disease  negative genitourinary   Musculoskeletal  (+)  Fibromyalgia -  Abdominal   Peds  Hematology negative hematology ROS (+)   Anesthesia Other Findings Pt with a 4-month history of chronic diarrhea, hospitalized early March 2024 at [facility] with critically low potassium of less than 2 and 3/18-3/22 at [facility] for noninfectious colitis complicated by AKI and hypokalemia. She complaint of abdominal pain, weight loss, generalized weakness, resulting in frequent falls. Pt has acute colitis. Severe hypokalemia   Past Medical History: No date: Anxiety No date: CHF (congestive heart failure) No date: CKD (chronic kidney disease) No date:  Depression No date: Fibromyalgia No date: Hypertension  Past Surgical History: No date: CERVICAL FUSION 01/26/2023: RIGHT/LEFT HEART CATH AND CORONARY ANGIOGRAPHY; Bilateral     Comment:  Procedure: RIGHT/LEFT HEART CATH AND CORONARY               ANGIOGRAPHY;  Surgeon: Bensimhon, Daniel R, MD;                Location: [facility] INVASIVE CV LAB;  Service: Cardiovascular;              Laterality: Bilateral;  BMI    Body Mass Index: 25.07 kg/m      Reproductive/Obstetrics negative OB ROS                             Anesthesia Physical Anesthesia Plan  ASA: 3  Anesthesia Plan: General   Post-op Pain Management:    Induction: Intravenous  PONV Risk Score and Plan: 2 and Propofol infusion and TIVA  Airway Management Planned: Natural Airway and Nasal Cannula  Additional Equipment:   Intra-op Plan:   Post-operative Plan:   Informed Consent:      Dental Advisory Given  Plan Discussed with: CRNA and Surgeon  Anesthesia Plan Comments:         Anesthesia Quick Evaluation  

## 2023-04-09 ENCOUNTER — Encounter: Admission: EM | Disposition: E | Payer: Self-pay | Source: Home / Self Care | Attending: Internal Medicine

## 2023-04-09 DIAGNOSIS — K529 Noninfective gastroenteritis and colitis, unspecified: Secondary | ICD-10-CM | POA: Diagnosis not present

## 2023-04-09 LAB — GLUCOSE, CAPILLARY
Glucose-Capillary: 112 mg/dL — ABNORMAL HIGH (ref 70–99)
Glucose-Capillary: 133 mg/dL — ABNORMAL HIGH (ref 70–99)
Glucose-Capillary: 94 mg/dL (ref 70–99)
Glucose-Capillary: 95 mg/dL (ref 70–99)
Glucose-Capillary: 97 mg/dL (ref 70–99)

## 2023-04-09 LAB — POTASSIUM
Potassium: 2.9 mmol/L — ABNORMAL LOW (ref 3.5–5.1)
Potassium: 3 mmol/L — ABNORMAL LOW (ref 3.5–5.1)

## 2023-04-09 LAB — BASIC METABOLIC PANEL
Anion gap: 15 (ref 5–15)
BUN: 31 mg/dL — ABNORMAL HIGH (ref 8–23)
CO2: 27 mmol/L (ref 22–32)
Calcium: 7.4 mg/dL — ABNORMAL LOW (ref 8.9–10.3)
Chloride: 98 mmol/L (ref 98–111)
Creatinine, Ser: 2.84 mg/dL — ABNORMAL HIGH (ref 0.44–1.00)
GFR, Estimated: 18 mL/min — ABNORMAL LOW (ref >60)
Glucose, Bld: 87 mg/dL (ref 70–99)
Potassium: 2.7 mmol/L — CL (ref 3.5–5.1)
Sodium: 140 mmol/L (ref 135–145)

## 2023-04-09 LAB — MAGNESIUM: Magnesium: 1 mg/dL — ABNORMAL LOW (ref 1.7–2.4)

## 2023-04-09 LAB — FOLATE: Folate: 4.7 ng/mL — ABNORMAL LOW (ref >5.9)

## 2023-04-09 LAB — VITAMIN D 25 HYDROXY (VIT D DEFICIENCY, FRACTURES): Vit D, 25-Hydroxy: 32.25 ng/mL (ref 30–100)

## 2023-04-09 LAB — VITAMIN B12: Vitamin B-12: 1351 pg/mL — ABNORMAL HIGH (ref 180–914)

## 2023-04-09 LAB — PHOSPHORUS: Phosphorus: 3.5 mg/dL (ref 2.5–4.6)

## 2023-04-09 SURGERY — ESOPHAGOGASTRODUODENOSCOPY (EGD) WITH PROPOFOL
Anesthesia: General

## 2023-04-09 MED ORDER — POTASSIUM CHLORIDE CRYS ER 20 MEQ PO TBCR: 40.0000 meq | EXTENDED_RELEASE_TABLET | ORAL | Status: DC

## 2023-04-09 MED ORDER — POTASSIUM CHLORIDE CRYS ER 20 MEQ PO TBCR
40.0000 meq | EXTENDED_RELEASE_TABLET | ORAL | Status: AC
  Administered 2023-04-09 (×3): 40 meq via ORAL
  Filled 2023-04-09 (×3): qty 2

## 2023-04-09 MED ORDER — SODIUM CHLORIDE 0.9 % IV SOLN
INTRAVENOUS | Status: DC
  Administered 2023-04-12: 1000 mL via INTRAVENOUS

## 2023-04-09 MED ORDER — SODIUM CHLORIDE 0.9 % IV SOLN: INTRAVENOUS | Status: DC

## 2023-04-09 MED ORDER — POTASSIUM CHLORIDE CRYS ER 20 MEQ PO TBCR
40.0000 meq | EXTENDED_RELEASE_TABLET | ORAL | Status: DC
  Administered 2023-04-09: 40 meq via ORAL
  Filled 2023-04-09: qty 2

## 2023-04-09 MED ORDER — PROCHLORPERAZINE EDISYLATE 10 MG/2ML IJ SOLN
10.0000 mg | Freq: Once | INTRAMUSCULAR | Status: AC
  Administered 2023-04-09: 10 mg via INTRAVENOUS
  Filled 2023-04-09: qty 2

## 2023-04-09 NOTE — TOC Progression Note (Signed)
Transition of Care [facility]) - Progression Note    Patient Details  Name: Emma Gould MRN: [id number] Date of Birth: 07/04/1956  Transition of Care [facility]) CM/SW Contact  Bing Quarry, RN Phone Number: 04/09/2023, 10:28 AM  Clinical Narrative:  04/09/23: This am patient is to be prepping for colonoscopy and attempting to intake prep solutions with much difficulty at the moment per RN progress note. Will attempt readmission risk screening later today if possible. Colonoscopy for tomorrow if able to tolerate/complete prep.  Gabriel Cirri RN CM          Expected Discharge Plan and Services                                               Social Determinants of Health (SDOH) Interventions SDOH Screenings   Food Insecurity: No Food Insecurity (04/07/2023)  Housing: Low Risk  (04/07/2023)  Transportation Needs: No Transportation Needs (04/07/2023)  Utilities: Not At Risk (04/07/2023)  Tobacco Use: High Risk (04/08/2023)    Readmission Risk Interventions     No data to display

## 2023-04-09 NOTE — Progress Notes (Signed)
Received report from floor nurse this morning. Patient has apparently not been able to tolerate drinking very much of colonoscopy prep, with bowel movements still brown in color. Per Dr. Tobi Bastos EGD/colon procedure cancelled for today.

## 2023-04-09 NOTE — Progress Notes (Signed)
Progress Note   Patient: Emma Gould GQB:[id number] DOB: 1956/03/15 DOA: 04/19/2023     2 DOS: the patient was seen and examined on 04/11/2023     Subjective:  Abdominal pain is improving She has not been able to undergo adequate bowel prep Still continues to have diarrhea resulting in low potassium level despite repletion Awaiting pain medications Denies nausea or vomiting     Brief hospital course:  Emma Gould is a 67 y.o. female with medical history significant for hypertension, , CKD llla, nonobstructive CAD, dCHF, anxiety, with a 38-month history of chronic diarrhea, hospitalized early March 2024 at [facility] with critically low potassium of less than 2 and 3/18-3/22 at [facility] for noninfectious colitis complicated by AKI and hypokalemia, discharged on loperamide and Bentyl and referred to GI, who presents to the ED with a 2-week complaint of abdominal pain, generalized weakness, resulting in frequent falls, most recently on the day of arrival, not resulting in any injury.  She denies fever, chills, dysuria and denies cough, chest pain or shortness of breath.  Patient has persistent nausea and intermittent vomiting to where it is difficult to keep anything on and has associated epigastric discomfort.  Vomiting is nonbloody nonbilious and non-coffee-ground.  His stool is dark, liquid.  She has not seen a gastroenterologist for this problem. In the emergency room CT abdomen and pelvis shows findings that represent mild colitis infectious or inflammatory.   Assessment and Plan:   Acute colitis Chronic diarrhea of unknown origin Patient presents with nausea vomiting and diarrhea, similar to hospitalization at [facility] from 3/18 to 3/22 GI infectious workup was negative with negative GI panel negative ova and parasites and negative C. difficile CT abdomen and pelvis showed colitis back in March and now shows mild colitis Patient has not yet followed up with GI - GI on board we appreciate input -  Follow-up stool studies - IV hydration and supportive care - GI has initiated patient on pancreatic enzyme trial empirically Patient being planned for EGD/colonoscopy by GI   Acute renal failure superimposed on stage 3a chronic kidney disease Increased anion gap metabolic acidosis Secondary to chronic diarrhea - Continue IV hydration with sodium bicarb with dextrose - Monitor and replace electrolytes - Diuretics with discontinued at last discharge from [facility] 3/22 -Symptomatic care with antidiarrheals   Generalized weakness Protein calorie malnutrition, suspected Frequent falls Secondary to ongoing chronic diarrhea, poor oral intake - Dietary consult - PT evaluation   Severe hypokalemia-continue repletion and monitoring secondary to ongoing chronic diarrhea  Chronic diastolic CHF (congestive heart failure) Echo 12/10/2022 EF 60-65% and RHC revealed diastolic dysfunction  Monitor for fluid overload in view of IV hydration   Coronary artery disease involving native coronary artery of native heart with angina pectoris History of nonobstructive CAD Continue carvedilol and ezetimibe   Migraine Continue Topamax and sumatriptan   Depression Continue sertraline       DVT prophylaxis: Lovenox   Consults: Gastroenterology   Advance Care Planning:   Code Status: Prior      Physical Exam: Vitals and nursing note reviewed.  Constitutional:      General: She is not in acute distress.    Comments: Frail and chronically ill-appearing  HENT:     Head: Normocephalic and atraumatic.  Cardiovascular:     Rate and Rhythm: Normal rate and regular rhythm.     Heart sounds: Normal heart sounds.  Pulmonary:     Effort: Pulmonary effort is normal.     Breath sounds: Normal  breath sounds.  Abdominal:     Palpations: Abdomen is soft.     Tenderness: Tenderness appreciated in the mid abdomen Neurological:     Mental Status: Mental status is at baseline.      Data Reviewed:  Laboratory results reviewed by me showing potassium 2.7   Family Communication: None present at bedside at this time   Disposition: Status is: Inpatient Patient still continues to meet inpatient criteria given current IV medication needed as well as requiring GI consultation for EGD/colonoscopy     Time spent: 45 minutes     Vitals:   04/08/23 2015 04/19/2023 0433 04/20/2023 0500 03/30/2023 0757  BP: 114/70 122/71  (!) 141/85  Pulse: 84 86  96  Resp: Temp: 97.7 F (36.5 C) 97.8 F (36.6 C)  98.2 F (36.8 C)  TempSrc: Oral Oral    SpO2: 100% 97%  100%  Weight:   66.6 kg   Height:         Author: Loyce Dys, MD 04/08/2023 1:17 PM  For on call review [email or web address].

## 2023-04-10 ENCOUNTER — Other Ambulatory Visit: Payer: Self-pay

## 2023-04-10 DIAGNOSIS — K529 Noninfective gastroenteritis and colitis, unspecified: Secondary | ICD-10-CM | POA: Diagnosis not present

## 2023-04-10 LAB — BASIC METABOLIC PANEL
Anion gap: 11 (ref 5–15)
BUN: 28 mg/dL — ABNORMAL HIGH (ref 8–23)
CO2: 23 mmol/L (ref 22–32)
Calcium: 6.9 mg/dL — ABNORMAL LOW (ref 8.9–10.3)
Chloride: 103 mmol/L (ref 98–111)
Creatinine, Ser: 2.49 mg/dL — ABNORMAL HIGH (ref 0.44–1.00)
GFR, Estimated: 21 mL/min — ABNORMAL LOW (ref >60)
Glucose, Bld: 67 mg/dL — ABNORMAL LOW (ref 70–99)
Potassium: 2.5 mmol/L — CL (ref 3.5–5.1)
Sodium: 137 mmol/L (ref 135–145)

## 2023-04-10 LAB — MAGNESIUM: Magnesium: 0.9 mg/dL — CL (ref 1.7–2.4)

## 2023-04-10 LAB — CBC WITH DIFFERENTIAL/PLATELET
Abs Immature Granulocytes: 1 10*3/uL — ABNORMAL HIGH (ref 0.00–0.07)
Basophils Absolute: 0.2 10*3/uL — ABNORMAL HIGH (ref 0.0–0.1)
Basophils Relative: 2 %
Eosinophils Absolute: 0.1 10*3/uL (ref 0.0–0.5)
Eosinophils Relative: 1 %
HCT: 36.4 % (ref 36.0–46.0)
Hemoglobin: 12.2 g/dL (ref 12.0–15.0)
Immature Granulocytes: 8 %
Lymphocytes Relative: 24 %
Lymphs Abs: 2.9 10*3/uL (ref 0.7–4.0)
MCH: 33.3 pg (ref 26.0–34.0)
MCHC: 33.5 g/dL (ref 30.0–36.0)
MCV: 99.5 fL (ref 80.0–100.0)
Monocytes Absolute: 1.5 10*3/uL — ABNORMAL HIGH (ref 0.1–1.0)
Monocytes Relative: 12 %
Neutro Abs: 6.3 10*3/uL (ref 1.7–7.7)
Neutrophils Relative %: 53 %
Platelets: 114 10*3/uL — ABNORMAL LOW (ref 150–400)
RBC: 3.66 MIL/uL — ABNORMAL LOW (ref 3.87–5.11)
RDW: 13.9 % (ref 11.5–15.5)
WBC: 12 10*3/uL — ABNORMAL HIGH (ref 4.0–10.5)
nRBC: 0.3 % — ABNORMAL HIGH (ref 0.0–0.2)

## 2023-04-10 LAB — GLUCOSE, CAPILLARY
Glucose-Capillary: 143 mg/dL — ABNORMAL HIGH (ref 70–99)
Glucose-Capillary: 115 mg/dL — ABNORMAL HIGH (ref 70–99)
Glucose-Capillary: 131 mg/dL — ABNORMAL HIGH (ref 70–99)
Glucose-Capillary: 116 mg/dL — ABNORMAL HIGH (ref 70–99)

## 2023-04-10 LAB — PHOSPHORUS: Phosphorus: 3.4 mg/dL (ref 2.5–4.6)

## 2023-04-10 MED ORDER — SODIUM CHLORIDE 0.9% FLUSH
10.0000 mL | Freq: Two times a day (BID) | INTRAVENOUS | Status: DC
  Administered 2023-04-11 – 2023-04-16 (×10): 10 mL

## 2023-04-10 MED ORDER — POTASSIUM CHLORIDE 10 MEQ/100ML IV SOLN
INTRAVENOUS | Status: AC
  Filled 2023-04-10: qty 300

## 2023-04-10 MED ORDER — MAGNESIUM SULFATE 2 GM/50ML IV SOLN
2.0000 g | Freq: Once | INTRAVENOUS | Status: DC
  Filled 2023-04-10: qty 50

## 2023-04-10 MED ORDER — POTASSIUM CHLORIDE 10 MEQ/100ML IV SOLN
10.0000 meq | INTRAVENOUS | Status: AC
  Administered 2023-04-10 (×2): 10 meq via INTRAVENOUS
  Filled 2023-04-10 (×2): qty 100

## 2023-04-10 MED ORDER — SODIUM CHLORIDE 0.9 % IV SOLN: INTRAVENOUS | Status: DC

## 2023-04-10 MED ORDER — CHLORHEXIDINE GLUCONATE CLOTH 2 % EX PADS
6.0000 | MEDICATED_PAD | Freq: Every day | CUTANEOUS | Status: DC
  Administered 2023-04-10 – 2023-04-15 (×6): 6 via TOPICAL

## 2023-04-10 MED ORDER — POTASSIUM CHLORIDE 10 MEQ/100ML IV SOLN
10.0000 meq | INTRAVENOUS | Status: AC
  Administered 2023-04-10 (×3): 10 meq via INTRAVENOUS
  Filled 2023-04-10: qty 100

## 2023-04-10 MED ORDER — DEXTROSE 50 % IV SOLN
12.5000 g | INTRAVENOUS | Status: AC
  Administered 2023-04-10: 12.5 g via INTRAVENOUS
  Filled 2023-04-10: qty 50

## 2023-04-10 MED ORDER — POTASSIUM CHLORIDE 20 MEQ PO PACK
40.0000 meq | PACK | Freq: Two times a day (BID) | ORAL | Status: AC
  Administered 2023-04-10 – 2023-04-11 (×3): 40 meq via ORAL
  Filled 2023-04-10 (×3): qty 2

## 2023-04-10 MED ORDER — SODIUM CHLORIDE 0.9% FLUSH: 10.0000 mL | INTRAVENOUS | Status: DC | PRN

## 2023-04-10 MED ORDER — MAGNESIUM SULFATE 2 GM/50ML IV SOLN
2.0000 g | INTRAVENOUS | Status: AC
  Administered 2023-04-10 (×3): 2 g via INTRAVENOUS
  Filled 2023-04-10 (×3): qty 50

## 2023-04-10 NOTE — Progress Notes (Signed)
Progress Note   Patient: Emma Gould ZOX:[id number] DOB: [birth year] DOA: 04/21/2023     3 DOS: the patient was seen and examined on 04/10/2023     Subjective:  Abdominal pain is improving Patient having severe hypokalemia and severe hypomagnesemia subsequently nephrologist input consulted in the setting of AKI on CKD Still continues to have diarrhea resulting in low potassium level despite repletion We have requested PICC line placement to help with aggressive repletion of electrolytes Denies nausea or vomiting     Brief hospital course:  Emma Gould is a 67 y.o. female with medical history significant for hypertension, , CKD llla, nonobstructive CAD, dCHF, anxiety, with a 13-month history of chronic diarrhea, hospitalized early March 2024 at [facility] with critically low potassium of less than 2 and 3/18-3/22 at [facility] for noninfectious colitis complicated by AKI and hypokalemia, discharged on loperamide and Bentyl and referred to GI, who presents to the ED with a 2-week complaint of abdominal pain, generalized weakness, resulting in frequent falls, most recently on the day of arrival, not resulting in any injury.  She denies fever, chills, dysuria and denies cough, chest pain or shortness of breath.  Patient has persistent nausea and intermittent vomiting to where it is difficult to keep anything on and has associated epigastric discomfort.  Vomiting is nonbloody nonbilious and non-coffee-ground.  His stool is dark, liquid.  She has not seen a gastroenterologist for this problem. In the emergency room CT abdomen and pelvis shows findings that represent mild colitis infectious or inflammatory.   Assessment and Plan:   Acute colitis Chronic diarrhea of unknown origin Patient presents with nausea vomiting and diarrhea, similar to hospitalization at [facility] from 3/18 to 3/22 GI infectious workup was negative with negative GI panel negative ova and parasites and negative C. difficile CT abdomen and pelvis  showed colitis back in March and now shows mild colitis Patient has not yet followed up with GI - GI on board we appreciate input - Follow-up stool studies - IV hydration and supportive care - GI has initiated patient on pancreatic enzyme trial empirically Patient being planned for EGD/colonoscopy by GI after electrolyte derangement corrected  Acute renal failure superimposed on stage 3a chronic kidney disease Increased anion gap metabolic acidosis Secondary to chronic diarrhea - Continue IV hydration with sodium bicarb with dextrose - Monitor and replace electrolytes - Diuretics was discontinued at last discharge from [facility] 3/22 -Symptomatic care with antidiarrheals Nephrologist on board we appreciate input  Generalized weakness Protein calorie malnutrition, suspected Frequent falls Secondary to ongoing chronic diarrhea, poor oral intake - Dietary consult - PT evaluation   Severe hypokalemia-continue repletion and monitoring secondary to ongoing chronic diarrhea Continue aggressive replacement therapy PICC line has been requested  Severe hypomagnesemia Continue aggressive magnesium replacement  Chronic diastolic CHF (congestive heart failure) Echo 12/10/2022 EF 60-65% and RHC revealed diastolic dysfunction  Monitor for fluid overload in view of IV hydration   Coronary artery disease involving native coronary artery of native heart with angina pectoris History of nonobstructive CAD Continue carvedilol and ezetimibe   Migraine Continue Topamax and sumatriptan   Depression Continue sertraline       DVT prophylaxis: Lovenox   Consults: Gastroenterology   Advance Care Planning:   Code Status: Prior      Physical Exam: Vitals and nursing note reviewed.  Constitutional:      General: She is not in acute distress.    Comments: Frail and chronically ill-appearing  HENT:     Head: Normocephalic and  atraumatic.  Cardiovascular:     Rate and Rhythm: Normal rate and  regular rhythm.     Heart sounds: Normal heart sounds.  Pulmonary:     Effort: Pulmonary effort is normal.     Breath sounds: Normal breath sounds.  Abdominal:     Palpations: Abdomen is soft.     Tenderness: Tenderness appreciated in the mid abdomen Neurological:     Mental Status: Mental status is at baseline.      Data Reviewed: Laboratory results reviewed by me showing potassium 2.5 as well as magnesium level very low 0.9   Family Communication: None present at bedside at this time   Disposition: Status is: Inpatient Patient still continues to meet inpatient criteria given current IV medication needed as well as requiring GI consultation for EGD/colonoscopy     Time spent: 50 minutes taking care of this patient as well as Case discussion with nephrologist, gastroenterologist, and anesthesiologist as well as nursing staff        Vitals:   03/30/2023 2004 04/10/23 0421 04/10/23 0500 04/10/23 0732  BP: (!) 144/83 126/86  (!) 149/82  Pulse: 87 90  97  Resp: 18 18  (!) 24  Temp: 97.7 F (36.5 C) 98.1 F (36.7 C)  98 F (36.7 C)  TempSrc:    Oral  SpO2: 98% 99%  99%  Weight:   63.8 kg   Height:         Author: Loyce Dys, MD 04/10/2023 1:36 PM  For on call review [email or web address].

## 2023-04-10 NOTE — Anesthesia Preprocedure Evaluation (Signed)
Anesthesia Evaluation  Patient identified by MRN, date of birth, ID band Patient awake    Reviewed: Allergy & Precautions, H&P , NPO status , Patient's Chart, lab work & pertinent test results  History of Anesthesia Complications Negative for: history of anesthetic complications  Airway Mallampati: III  TM Distance: >3 FB Neck ROM: full    Dental  (+) Dental Advidsory Given, Edentulous Upper, Edentulous Lower   Pulmonary shortness of breath and with exertion, COPD, neg recent URI, Current Smoker smokes about half pack to pack a day daily   Pulmonary exam normal        Cardiovascular hypertension, (-) angina + CAD (nonobstructive CAD) and +CHF (Chronic diastolic CHF)  (-) Past MI, (-) Cardiac Stents and (-) CABG Normal cardiovascular exam(-) dysrhythmias (-) Valvular Problems/Murmurs  Echo 12/10/2022 EF 60-65% and RHC revealed diastolic dysfunction    Neuro/Psych neg Seizures PSYCHIATRIC DISORDERS Anxiety Depression    S/p cervical fusion TIA Neuromuscular disease    GI/Hepatic Neg liver ROS,GERD  ,,Acute colitis Chronic diarrhea of unknown origin    Endo/Other  negative endocrine ROS    Renal/GU Renal InsufficiencyRenal diseaseAcute renal failure superimposed on stage 3a chronic kidney disease  negative genitourinary   Musculoskeletal  (+)  Fibromyalgia -  Abdominal   Peds  Hematology negative hematology ROS (+)   Anesthesia Other Findings Pt with a 65-month history of chronic diarrhea, hospitalized early March 2024 at [facility] with critically low potassium of less than 2 and 3/18-3/22 at [facility] for noninfectious colitis complicated by AKI and hypokalemia. She complaint of abdominal pain, weight loss, generalized weakness, resulting in frequent falls. Pt has acute colitis. Severe hypokalemia   Past Medical History: No date: Anxiety No date: CHF (congestive heart failure) No date: CKD (chronic kidney disease) No date:  Depression No date: Fibromyalgia No date: Hypertension  Past Surgical History: No date: CERVICAL FUSION 01/26/2023: RIGHT/LEFT HEART CATH AND CORONARY ANGIOGRAPHY; Bilateral     Comment:  Procedure: RIGHT/LEFT HEART CATH AND CORONARY               ANGIOGRAPHY;  Surgeon: Dolores Patty, MD;                Location: [facility] INVASIVE CV LAB;  Service: Cardiovascular;              Laterality: Bilateral;  BMI    Body Mass Index: 25.07 kg/m      Reproductive/Obstetrics negative OB ROS                             Anesthesia Physical Anesthesia Plan  ASA: 3  Anesthesia Plan: General   Post-op Pain Management:    Induction: Intravenous  PONV Risk Score and Plan: 2 and Propofol infusion and TIVA  Airway Management Planned: Natural Airway and Nasal Cannula  Additional Equipment:   Intra-op Plan:   Post-operative Plan:   Informed Consent:      Dental Advisory Given  Plan Discussed with: CRNA and Surgeon  Anesthesia Plan Comments:         Anesthesia Quick Evaluation

## 2023-04-10 NOTE — Consult Note (Signed)
[facility]  CONSULT NOTE    Date: 04/10/2023                  Patient Name:  Emma Gould  MRN: [id number]  DOB: 05-Mar-1956  Age / Sex: 67 y.o., female         PCP: Randel Pigg, Dorma Russell, MD                 Service Requesting Consult: [facility]                 Reason for Consult: Acute kidney injury with electrolyte imbalance            History of Present Illness: Ms. Berlene Dixson is a 67 y.o.  female with past medical history consistent with hypertension, nonobstructive CAD, diastolic heart failure, and chronic kidney disease, who was admitted to [facility] on 04/04/2023 for Colitis [K52.9] Acute colitis [K52.9] AKI (acute kidney injury) [N17.9]  Patient seen and evaluated at bedside.  Patient states she has suffered with chronic diarrhea for about 4 months.  She states she has seen numerous physicians at [facility], [place], and now this facility for management.  Denies precipitating factors of food or medications.  She states she began having abdominal pain for about 2 weeks with increased weakness prompting her ED arrival.  Denies known fever or chills.  No chest pain.  Also complains of nausea with intermittent vomiting.  Labs on ED arrival significant for sodium 132, potassium 2.4, BUN 34, creatinine 2.86 with GFR 18, calcium 7.2, albumin 1.9.  Negative for C. difficile.During this admission potassium has ranged from 2.4-3.2.  Currently 2.5.  Magnesium also 0.9 of note.  Patient states no GI workup has been completed, GI has been consulted.   Medications: Outpatient medications: Medications Prior to Admission  Medication Sig Dispense Refill Last Dose   acetaminophen (TYLENOL) 325 MG tablet Take 2 tablets (650 mg total) by mouth every 6 (six) hours as needed for mild pain or headache (fever >/= 101).   unknown   carvedilol (COREG) 6.25 MG tablet Take 1 tablet (6.25 mg total) by mouth 2 (two) times daily with a meal. 60 tablet 3 04/04/2023   empagliflozin (JARDIANCE) 10 MG TABS  tablet Take 1 tablet (10 mg total) by mouth daily before breakfast. 30 tablet 6 04/14/2023   Eszopiclone 3 MG TABS Take 3 mg by mouth at bedtime.   04/06/2023   ezetimibe (ZETIA) 10 MG tablet Take 10 mg by mouth daily.   04/19/2023   omeprazole (PRILOSEC) 40 MG capsule Take 40 mg by mouth daily.   04/13/2023   ondansetron (ZOFRAN) 4 MG tablet Take 4 mg by mouth every 8 (eight) hours as needed for nausea.   unknown   oxyCODONE-acetaminophen (PERCOCET) 10-325 MG tablet Take 1 tablet by mouth 4 (four) times daily as needed for pain.   04/03/2023   potassium chloride SA (KLOR-CON M) 20 MEQ tablet Take 2 tablets (40 mEq total) by mouth daily. 30 tablet 0 04/18/2023   QUEtiapine (SEROQUEL) 25 MG tablet Take 4 tablets (100 mg total) by mouth at bedtime. (Patient taking differently: Take 50 mg by mouth 2 (two) times daily.) 30 tablet 0 04/26/2023   sertraline (ZOLOFT) 100 MG tablet Take 100 mg by mouth daily.   03/31/2023   spironolactone (ALDACTONE) 25 MG tablet Take 12.5 mg by mouth daily.   03/30/2023   SUMAtriptan (IMITREX) 50 MG tablet Take 50 mg by mouth as needed for migraine. Take a second  tablet 2 hours later if needed; max /day   unknown   topiramate (TOPAMAX) 100 MG tablet Take 100 mg by mouth 2 (two) times daily.   03/31/2023   torsemide (DEMADEX) 20 MG tablet TAKE 1 TABLET BY MOUTH EVERY DAY 90 tablet 1 Past Month   albuterol (VENTOLIN HFA) 108 (90 Base) MCG/ACT inhaler Inhale 2 puffs into the lungs 4 (four) times daily as needed for shortness of breath. (Patient not taking: Reported on 04/21/2023)   Not Taking    Current medications: Current Facility-Administered Medications  Medication Dose Route Frequency Provider Last Rate Last Admin   0.9 %  sodium chloride infusion   Intravenous Continuous Toney Reil, MD 20 mL/hr at 03/30/2023 1043 New Bag at 04/08/2023 1043   0.9 %  sodium chloride infusion   Intravenous Continuous Wyline Mood, MD 20 mL/hr at 04/10/23 0520 Infusion Verify at 04/10/23  0520   0.9 %  sodium chloride infusion   Intravenous Continuous Loyce Dys, MD 75 mL/hr at 04/10/23 0927 New Bag at 04/10/23 0927   acetaminophen (TYLENOL) tablet 650 mg  650 mg Oral Q6H PRN Andris Baumann, MD   650 mg at 04/15/2023 1329   Or   acetaminophen (TYLENOL) suppository 650 mg  650 mg Rectal Q6H PRN Andris Baumann, MD       ascorbic acid (VITAMIN C) tablet 500 mg  500 mg Oral BID Rosezetta Schlatter T, MD   500 mg at 04/10/23 0909   carvedilol (COREG) tablet 6.25 mg  6.25 mg Oral BID WC Lindajo Royal V, MD   6.25 mg at 04/10/23 0824   cefTRIAXone (ROCEPHIN) 2 g in sodium chloride 0.9 % 100 mL IVPB  2 g Intravenous Q24H Loyce Dys, MD   Stopped at 04/19/2023 2223   enoxaparin (LOVENOX) injection 30 mg  30 mg Subcutaneous QHS Rosezetta Schlatter T, MD   30 mg at 04/11/2023 2008   ezetimibe (ZETIA) tablet 10 mg  10 mg Oral Daily Andris Baumann, MD   10 mg at 04/10/23 0910   feeding supplement (BOOST / RESOURCE ) liquid 1 Container  1 Container Oral TID BM Loyce Dys, MD       lipase/protease/amylase (CREON) capsule 72,000 Units  72,000 Units Oral TID WC Vanga, Loel Dubonnet, MD       magnesium sulfate IVPB 2 g 50 mL  2 g Intravenous Q2H Rosezetta Schlatter T, MD 50 mL/hr at 04/10/23 1004 2 g at 04/10/23 1004   metroNIDAZOLE (FLAGYL) IVPB 500 mg  500 mg Intravenous Q8H Shaune Pollack, MD   Stopped at 04/10/23 0502   multivitamin with minerals tablet 1 tablet  1 tablet Oral Daily Loyce Dys, MD   1 tablet at 04/10/23 0933   ondansetron (ZOFRAN) tablet 4 mg  4 mg Oral Q6H PRN Andris Baumann, MD   4 mg at 04/08/23 2257   Or   ondansetron (ZOFRAN) injection 4 mg  4 mg Intravenous Q6H PRN Andris Baumann, MD   4 mg at 04/10/23 1001   oxyCODONE-acetaminophen (PERCOCET/ROXICET) 5-325 MG per tablet 2 tablet  2 tablet Oral QID PRN Andris Baumann, MD   2 tablet at 04/10/23 1000   pantoprazole (PROTONIX) EC tablet 40 mg  40 mg Oral Daily Lindajo Royal V, MD   40 mg at 04/10/23 0910   potassium  chloride (KLOR-CON) packet 40 mEq  40 mEq Oral BID Loyce Dys, MD   40 mEq at 04/10/23 [phone]  potassium chloride 10 mEq in 100 mL IVPB  10 mEq Intravenous Q1 Hr x 5 Djan, Prince T, MD 100 mL/hr at 04/10/23 0823 10 mEq at 04/10/23 0823   QUEtiapine (SEROQUEL) tablet 50 mg  50 mg Oral BID Andris Baumann, MD   50 mg at 04/10/23 0911   sertraline (ZOLOFT) tablet 100 mg  100 mg Oral Daily Andris Baumann, MD   100 mg at 04/10/23 6967   spironolactone (ALDACTONE) tablet 12.5 mg  12.5 mg Oral Daily Lindajo Royal V, MD   12.5 mg at 04/10/23 0911   SUMAtriptan (IMITREX) tablet 50 mg  50 mg Oral PRN Andris Baumann, MD       topiramate (TOPAMAX) tablet 50 mg  50 mg Oral BID Andris Baumann, MD   50 mg at 04/10/23 8938      Allergies: No Known Allergies    Past Medical History: Past Medical History:  Diagnosis Date   Anxiety    CHF (congestive heart failure)    CKD (chronic kidney disease)    Depression    Fibromyalgia    Hypertension      Past Surgical History: Past Surgical History:  Procedure Laterality Date   CERVICAL FUSION     RIGHT/LEFT HEART CATH AND CORONARY ANGIOGRAPHY Bilateral 01/26/2023   Procedure: RIGHT/LEFT HEART CATH AND CORONARY ANGIOGRAPHY;  Surgeon: Dolores Patty, MD;  Location: [facility] INVASIVE CV LAB;  Service: Cardiovascular;  Laterality: Bilateral;     Family History: Family History  Problem Relation Age of Onset   Stroke Mother    Hypertension Mother    Lung cancer Father        Father died of lung cancer     Social History: Social History   Socioeconomic History   Marital status: Widowed    Spouse name: Not on file   Number of children: Not on file   Years of education: Not on file   Highest education level: Not on file  Occupational History   Not on file  Tobacco Use   Smoking status: Every Day    Packs/day: 0.50    Years: 45.00    Additional pack years: 0.00    Total pack years: 22.50    Types: Cigarettes   Smokeless tobacco:  Never  Substance and Sexual Activity   Alcohol use: Never   Drug use: Never   Sexual activity: Not on file  Other Topics Concern   Not on file  Social History Narrative   Not on file   Social Determinants of Health   Financial Resource Strain: Not on file  Food Insecurity: No Food Insecurity (04/06/2023)   Hunger Vital Sign    Worried About Running Out of Food in the Last Year: Never true    Ran Out of Food in the Last Year: Never true  Transportation Needs: No Transportation Needs (04/26/2023)   PRAPARE - Administrator, Civil Service (Medical): No    Lack of Transportation (Non-Medical): No  Physical Activity: Not on file  Stress: Not on file  Social Connections: Not on file  Intimate Partner Violence: Not At Risk (04/04/2023)   Humiliation, Afraid, Rape, and Kick questionnaire    Fear of Current or Ex-Partner: No    Emotionally Abused: No    Physically Abused: No    Sexually Abused: No     Review of Systems: Review of Systems  Constitutional:  Negative for chills, fever and malaise/fatigue.  HENT:  Negative for congestion, sore  throat and tinnitus.   Eyes:  Negative for blurred vision and redness.  Respiratory:  Negative for cough, shortness of breath and wheezing.   Cardiovascular:  Negative for chest pain, palpitations, claudication and leg swelling.  Gastrointestinal:  Positive for abdominal pain and diarrhea. Negative for blood in stool, nausea and vomiting.  Genitourinary:  Negative for flank pain, frequency and hematuria.  Musculoskeletal:  Negative for back pain, falls and myalgias.  Skin:  Negative for rash.  Neurological:  Positive for weakness. Negative for dizziness and headaches.  Endo/Heme/Allergies:  Does not bruise/bleed easily.  Psychiatric/Behavioral:  Negative for depression. The patient is not nervous/anxious and does not have insomnia.     Vital Signs: Blood pressure (!) 149/82, pulse 97, temperature 98 F (36.7 C), temperature source  Oral, resp. rate (!) 24, height 5\' 3"  (1.6 m), weight 63.8 kg, SpO2 99 %.  Weight trends: Filed Weights   04/08/23 1715 04/17/2023 0500 04/10/23 0500  Weight: 64.2 kg 66.6 kg 63.8 kg    Physical Exam: General: NAD  Head: Normocephalic, atraumatic. Moist oral mucosal membranes  Eyes: Anicteric  Lungs:  Clear to auscultation  Heart: Regular rate and rhythm  Abdomen:  Soft, nontender  Extremities: No peripheral edema.  Neurologic: Alert and oriented, moving all four extremities  Skin: No lesions  Access: None     Lab results: Basic Metabolic Panel: Recent Labs  Lab 04/08/23 0515 04/08/23 0523 04/08/23 1802 04/23/2023 0904 04/14/2023 1236 04/17/2023 1746 04/10/23 0447  NA  --  132*  --  140  --   --  137  K  --  2.4*   < > 2.7* 2.9* 3.0* 2.5*  CL  --  94*  --  98  --   --  103  CO2  --  27  --  27  --   --  23  GLUCOSE  --  90  --  87  --   --  67*  BUN  --  34*  --  31*  --   --  28*  CREATININE  --  2.86*  --  2.84*  --   --  2.49*  CALCIUM  --  7.2*  --  7.4*  --   --  6.9*  MG 1.2*  --   --  1.0*  --   --  0.9*  PHOS  --   --   --  3.5  --   --  3.4   < > = values in this interval not displayed.    Liver Function Tests: Recent Labs  Lab 04/25/2023 1415 04/08/23 0523  AST 38 34  ALT 19 15  ALKPHOS 173* 141*  BILITOT 0.8 0.6  PROT 6.2* 4.9*  ALBUMIN 2.3* 1.9*   Recent Labs  Lab 04/02/2023 1415  LIPASE 27   No results for input(s): "AMMONIA" in the last 168 hours.  CBC: Recent Labs  Lab 04/06/2023 1415 04/08/23 0523 04/10/23 0447  WBC 16.3* 13.4* 12.0*  NEUTROABS 9.6*  --  6.3  HGB 15.6* 12.4 12.2  HCT 47.5* 36.3 36.4  MCV 103.0* 98.9 99.5  PLT 199 148* 114*    Cardiac Enzymes: No results for input(s): "CKTOTAL", "CKMB", "CKMBINDEX", "TROPONINI" in the last 168 hours.  BNP: Invalid input(s): "POCBNP"  CBG: Recent Labs  Lab 04/03/2023 0800 04/22/2023 1134 04/24/2023 1711 04/06/2023 2133 04/10/23 0734  GLUCAP 95 97 133* 112* 143*     Microbiology: Results for orders placed or performed during the hospital encounter  of 04/22/2023  Gastrointestinal Panel by PCR , Stool     Status: None   Collection Time: 04/08/23 11:40 AM   Specimen: Stool  Result Value Ref Range Status   Campylobacter species NOT DETECTED NOT DETECTED Final   Plesimonas shigelloides NOT DETECTED NOT DETECTED Final   Salmonella species NOT DETECTED NOT DETECTED Final   Yersinia enterocolitica NOT DETECTED NOT DETECTED Final   Vibrio species NOT DETECTED NOT DETECTED Final   Vibrio cholerae NOT DETECTED NOT DETECTED Final   Enteroaggregative E coli (EAEC) NOT DETECTED NOT DETECTED Final   Enteropathogenic E coli (EPEC) NOT DETECTED NOT DETECTED Final   Enterotoxigenic E coli (ETEC) NOT DETECTED NOT DETECTED Final   Shiga like toxin producing E coli (STEC) NOT DETECTED NOT DETECTED Final   Shigella/Enteroinvasive E coli (EIEC) NOT DETECTED NOT DETECTED Final   Cryptosporidium NOT DETECTED NOT DETECTED Final   Cyclospora cayetanensis NOT DETECTED NOT DETECTED Final   Entamoeba histolytica NOT DETECTED NOT DETECTED Final   Giardia lamblia NOT DETECTED NOT DETECTED Final   Adenovirus F40/41 NOT DETECTED NOT DETECTED Final   Astrovirus NOT DETECTED NOT DETECTED Final   Norovirus GI/GII NOT DETECTED NOT DETECTED Final   Rotavirus A NOT DETECTED NOT DETECTED Final   Sapovirus (I, II, IV, and V) NOT DETECTED NOT DETECTED Final    Comment: Performed at [facility], [address] [place], [place] [address]  C Difficile Quick Screen w PCR reflex     Status: None   Collection Time: 04/08/23 11:40 AM   Specimen: STOOL  Result Value Ref Range Status   C Diff antigen NEGATIVE NEGATIVE Final   C Diff toxin NEGATIVE NEGATIVE Final   C Diff interpretation No C. difficile detected.  Final    Comment: Performed at [facility], [address] [place], [place] [address]    Coagulation Studies: No results for input(s): "LABPROT", "INR" in  the last 72 hours.  Urinalysis: Recent Labs    04/05/2023 2057  COLORURINE YELLOW*  LABSPEC 1.006  PHURINE 5.0  GLUCOSEU NEGATIVE  HGBUR NEGATIVE  BILIRUBINUR NEGATIVE  KETONESUR NEGATIVE  PROTEINUR NEGATIVE  NITRITE NEGATIVE  LEUKOCYTESUR NEGATIVE      Imaging: No results found.   Assessment & Plan: Ms. Arthi Mcdonald is a 67 y.o.  female with past medical history consistent with hypertension, nonobstructive CAD, diastolic heart failure, and chronic kidney disease,, who was admitted to [facility] on 04/08/2023 for Colitis [K52.9] Acute colitis [K52.9] AKI (acute kidney injury) [N17.9]  Acute kidney injury on chronic kidney disease stage IIIa.  Baseline creatinine appears to be 1.21 with GFR 50 on 02/26/2023.  Acute kidney injury secondary to hypovolemia due to persistent diarrhea.  No IV contrast exposure.  Creatinine 3.17 on ED admission.  Agree with holding torsemide.  Creatinine improving.  Agree with IV fluids for hydration.  2.  Severe hypokalemia/hypomagnesia.  Likely due to persistent diarrhea.  Will have to aggressively treat magnesium with 4 to 6 g/day.  Will likely require alternating doses of IV potassium supplementation also, at least 60 mill equivalents.  If magnesium remains depleted, will be heart to maintain appropriate potassium levels.  3.  Hypertension with chronic kidney disease.  Home regimen includes carvedilol, ezetimbre, spironolactone, and torsemide.  Receiving all medications except torsemide.  4.  Chronic diarrhea, etiology unknown.  C. difficile negative.  GI panel negative.  GI consulted, patient currently receiving colonoscopy/EGD prep.  LOS: 3   4/14/202411:06 AM

## 2023-04-10 NOTE — Progress Notes (Signed)
Peripherally Inserted Central Catheter Placement  The IV Nurse has discussed with the patient and/or persons authorized to consent for the patient, the purpose of this procedure and the potential benefits and risks involved with this procedure.  The benefits include less needle sticks, lab draws from the catheter, and the patient may be discharged home with the catheter. Risks include, but not limited to, infection, bleeding, blood clot (thrombus formation), and puncture of an artery; nerve damage and irregular heartbeat and possibility to perform a PICC exchange if needed/ordered by physician.  Alternatives to this procedure were also discussed.  Bard Power PICC patient education guide, fact sheet on infection prevention and patient information card has been provided to patient /or left at bedside.    PICC Placement Documentation  PICC Double Lumen 04/10/23 Right Basilic 38 cm 1 cm (Active)  Indication for Insertion or Continuance of Line Vasoactive infusions;Limited venous access - need for IV therapy >5 days (PICC only) 04/10/23 1550  Exposed Catheter (cm) 1 cm 04/10/23 1550  Site Assessment Clean, Dry, Intact;Bruised 04/10/23 1550  Lumen #1 Status Saline locked;Flushed;Blood return noted 04/10/23 1550  Lumen #2 Status Saline locked;Flushed;Blood return noted 04/10/23 1550  Dressing Type Transparent;Securing device 04/10/23 1550  Dressing Status Antimicrobial disc in place;Clean, Dry, Intact 04/10/23 1550  Safety Lock Not Applicable 04/10/23 1550  Line Care Connections checked and tightened 04/10/23 1550  Line Adjustment (NICU/IV Team Only) No 04/10/23 1550  Dressing Intervention New dressing 04/10/23 1550  Dressing Change Due 04/17/23 04/10/23 1550       Burnard Bunting Chenice 04/10/2023, 3:58 PM

## 2023-04-10 NOTE — Progress Notes (Signed)
GI note  Patient was supposed to have an EGD and colonoscopy for persistent chronic diarrhea unfortunately the electrolytes have been very low and unable to proceed.  nephrology has been consulted and once her electrolytes have normalized can proceed with EGD and colonoscopy.  Dr. Servando Snare  will be following up tomorrow   Dr Wyline Mood MD,MRCP [facility]) Gastroenterology/Hepatology Pager: [phone]

## 2023-04-10 NOTE — Progress Notes (Signed)
This nurse received critical lab values from lab for patient. K- 2.5 , Mg- 0.9 and BG is 67. Patient is NPO for procedure this am. This SN sent secure chat to attending MD- Rosezetta Schlatter and GI doctor Lannette Donath.

## 2023-04-11 DIAGNOSIS — K529 Noninfective gastroenteritis and colitis, unspecified: Secondary | ICD-10-CM | POA: Diagnosis not present

## 2023-04-11 LAB — CBC WITH DIFFERENTIAL/PLATELET
Abs Immature Granulocytes: 0.85 10*3/uL — ABNORMAL HIGH (ref 0.00–0.07)
Basophils Absolute: 0.2 10*3/uL — ABNORMAL HIGH (ref 0.0–0.1)
Basophils Relative: 1 %
Eosinophils Absolute: 0.1 10*3/uL (ref 0.0–0.5)
Eosinophils Relative: 1 %
HCT: 34.2 % — ABNORMAL LOW (ref 36.0–46.0)
Hemoglobin: 11.5 g/dL — ABNORMAL LOW (ref 12.0–15.0)
Immature Granulocytes: 6 %
Lymphocytes Relative: 18 %
Lymphs Abs: 2.6 10*3/uL (ref 0.7–4.0)
MCH: 33.4 pg (ref 26.0–34.0)
MCHC: 33.6 g/dL (ref 30.0–36.0)
MCV: 99.4 fL (ref 80.0–100.0)
Monocytes Absolute: 1.3 10*3/uL — ABNORMAL HIGH (ref 0.1–1.0)
Monocytes Relative: 9 %
Neutro Abs: 9 10*3/uL — ABNORMAL HIGH (ref 1.7–7.7)
Neutrophils Relative %: 65 %
Platelets: 116 10*3/uL — ABNORMAL LOW (ref 150–400)
RBC: 3.44 MIL/uL — ABNORMAL LOW (ref 3.87–5.11)
RDW: 14.3 % (ref 11.5–15.5)
Smear Review: NORMAL
WBC: 14 10*3/uL — ABNORMAL HIGH (ref 4.0–10.5)
nRBC: 0.4 % — ABNORMAL HIGH (ref 0.0–0.2)

## 2023-04-11 LAB — BASIC METABOLIC PANEL
Anion gap: 8 (ref 5–15)
BUN: 19 mg/dL (ref 8–23)
CO2: 21 mmol/L — ABNORMAL LOW (ref 22–32)
Calcium: 7 mg/dL — ABNORMAL LOW (ref 8.9–10.3)
Chloride: 107 mmol/L (ref 98–111)
Creatinine, Ser: 1.73 mg/dL — ABNORMAL HIGH (ref 0.44–1.00)
GFR, Estimated: 33 mL/min — ABNORMAL LOW (ref >60)
Glucose, Bld: 77 mg/dL (ref 70–99)
Potassium: 3.7 mmol/L (ref 3.5–5.1)
Sodium: 136 mmol/L (ref 135–145)

## 2023-04-11 LAB — MAGNESIUM: Magnesium: 2.9 mg/dL — ABNORMAL HIGH (ref 1.7–2.4)

## 2023-04-11 LAB — CORTISOL-AM, BLOOD: Cortisol - AM: 6.3 ug/dL — ABNORMAL LOW (ref 6.7–22.6)

## 2023-04-11 LAB — GLUCOSE, CAPILLARY
Glucose-Capillary: 149 mg/dL — ABNORMAL HIGH (ref 70–99)
Glucose-Capillary: 174 mg/dL — ABNORMAL HIGH (ref 70–99)

## 2023-04-11 LAB — POTASSIUM
Potassium: 3.4 mmol/L — ABNORMAL LOW (ref 3.5–5.1)
Potassium: 3.7 mmol/L (ref 3.5–5.1)
Potassium: 3 mmol/L — ABNORMAL LOW (ref 3.5–5.1)

## 2023-04-11 LAB — PHOSPHORUS: Phosphorus: 2.3 mg/dL — ABNORMAL LOW (ref 2.5–4.6)

## 2023-04-11 MED ORDER — PEG 3350-KCL-NA BICARB-NACL 420 G PO SOLR
4000.0000 mL | Freq: Once | ORAL | Status: AC
  Administered 2023-04-11: 4000 mL via ORAL
  Filled 2023-04-11: qty 4000

## 2023-04-11 MED ORDER — VITAMIN D 25 MCG (1000 UNIT) PO TABS
1000.0000 [IU] | ORAL_TABLET | Freq: Every day | ORAL | Status: DC
  Administered 2023-04-13 – 2023-04-14 (×2): 1000 [IU] via ORAL
  Filled 2023-04-11 (×2): qty 1

## 2023-04-11 MED ORDER — FOLIC ACID 1 MG PO TABS
1.0000 mg | ORAL_TABLET | Freq: Every day | ORAL | Status: DC
  Administered 2023-04-13 – 2023-04-14 (×2): 1 mg via ORAL
  Filled 2023-04-11 (×2): qty 1

## 2023-04-11 MED ORDER — POTASSIUM CHLORIDE 10 MEQ/100ML IV SOLN
10.0000 meq | INTRAVENOUS | Status: AC
  Administered 2023-04-11 (×2): 10 meq via INTRAVENOUS
  Filled 2023-04-11 (×2): qty 100

## 2023-04-11 NOTE — Plan of Care (Signed)
Pt alert and oriented x 4. Up 1 assist to bsc. Pt received 2 doses of zofran and percocet. Pt received 3 dose of potassium IV on this shift. Last k 3.4. Vitals stable. NPO with sips with meds/ice chips. Plan for EGD today at 12:30.  Problem: Education: Goal: Knowledge of General Education information will improve Description: Including pain rating scale, medication(s)/side effects and non-pharmacologic comfort measures Outcome: Progressing   Problem: Health Behavior/Discharge Planning: Goal: Ability to manage health-related needs will improve Outcome: Progressing   Problem: Clinical Measurements: Goal: Ability to maintain clinical measurements within normal limits will improve Outcome: Progressing Goal: Will remain free from infection Outcome: Progressing Goal: Diagnostic test results will improve Outcome: Progressing Goal: Respiratory complications will improve Outcome: Progressing Goal: Cardiovascular complication will be avoided Outcome: Progressing   Problem: Activity: Goal: Risk for activity intolerance will decrease Outcome: Progressing   Problem: Nutrition: Goal: Adequate nutrition will be maintained Outcome: Progressing   Problem: Coping: Goal: Level of anxiety will decrease Outcome: Progressing   Problem: Elimination: Goal: Will not experience complications related to bowel motility Outcome: Progressing Goal: Will not experience complications related to urinary retention Outcome: Progressing   Problem: Pain Managment: Goal: General experience of comfort will improve Outcome: Progressing   Problem: Safety: Goal: Ability to remain free from injury will improve Outcome: Progressing   Problem: Skin Integrity: Goal: Risk for impaired skin integrity will decrease Outcome: Progressing

## 2023-04-11 NOTE — Progress Notes (Signed)
Progress Note   Patient: Emma Gould [id number] DOB: 23-Nov-1956 DOA: 03/29/2023     4 DOS: the patient was seen and examined on 04/11/2023    Subjective:  Abdominal pain is improving He admits to improvement in abdominal pain Being planned for endoscopic intervention today by gastroenterologist Denies nausea or vomiting     Brief hospital course:  Emma Gould is a 67 y.o. female with medical history significant for hypertension, , CKD llla, nonobstructive CAD, dCHF, anxiety, with a 31-month history of chronic diarrhea, hospitalized early March 2024 at [facility] with critically low potassium of less than 2 and 3/18-3/22 at [facility] for noninfectious colitis complicated by AKI and hypokalemia, discharged on loperamide and Bentyl and referred to GI, who presents to the ED with a 2-week complaint of abdominal pain, generalized weakness, resulting in frequent falls, most recently on the day of arrival, not resulting in any injury.  She denies fever, chills, dysuria and denies cough, chest pain or shortness of breath.  Patient has persistent nausea and intermittent vomiting to where it is difficult to keep anything on and has associated epigastric discomfort.  Vomiting is nonbloody nonbilious and non-coffee-ground.  His stool is dark, liquid.  She has not seen a gastroenterologist for this problem. In the emergency room CT abdomen and pelvis shows findings that represent mild colitis infectious or inflammatory.   Assessment and Plan:   Acute colitis Chronic diarrhea of unknown origin Patient presents with nausea vomiting and diarrhea, similar to hospitalization at [facility] from 3/18 to 3/22 GI infectious workup was negative with negative GI panel negative ova and parasites and negative C. difficile CT abdomen and pelvis showed colitis back in March and now shows mild colitis Patient has not yet followed up with GI - GI on board we appreciate input - Stool studies within normal limits - IV hydration and  supportive care - Continue pancreatic enzymes as recommended by GI Being planned for endoscopic intervention today by gastroenterologist  Acute renal failure superimposed on stage 3a chronic kidney disease Increased anion gap metabolic acidosis Secondary to chronic diarrhea - Continue IV hydration  - Monitor and replace electrolytes - Diuretics was discontinued at last discharge from [facility] 3/22 -Symptomatic care with antidiarrheals Nephrologist on board we appreciate input   Generalized weakness Protein calorie malnutrition, suspected Frequent falls Secondary to ongoing chronic diarrhea, poor oral intake - Dietary consult - PT evaluation   Severe hypokalemia-continue repletion and monitoring secondary to ongoing chronic diarrhea Continue aggressive replacement therapy as needed PICC line in place-this was placed electrolyte correction and IV fluid need in the setting of severe electrolyte derangements Monitor electrolytes closely   Severe hypomagnesemia Continue aggressive magnesium replacement as needed   Chronic diastolic CHF (congestive heart failure) Echo 12/10/2022 EF 60-65% and RHC revealed diastolic dysfunction  Monitor for fluid overload in view of IV hydration   Coronary artery disease involving native coronary artery of native heart with angina pectoris History of nonobstructive CAD Continue carvedilol and ezetimibe   Migraine Continue Topamax and sumatriptan   Depression Continue sertraline       DVT prophylaxis: Lovenox   Consults: Gastroenterology   Advance Care Planning:   Code Status: Prior      Physical Exam: Vitals and nursing note reviewed.  Constitutional:      General: She is not in acute distress.    Comments: Frail and chronically ill-appearing  HENT:     Head: Normocephalic and atraumatic.  Cardiovascular:     Rate and Rhythm: Normal rate  and regular rhythm.     Heart sounds: Normal heart sounds.  Pulmonary:     Effort: Pulmonary  effort is normal.     Breath sounds: Normal breath sounds.  Abdominal:     Palpations: Abdomen is soft.     Tenderness: Tenderness appreciated in the mid abdomen Neurological:     Mental Status: Mental status is at baseline.      Data Reviewed: Laboratory results reviewed with improvement in potassium level to 3.7 today and magnesium of 2.9   Family Communication: None present at bedside at this time   Disposition: Status is: Inpatient Patient still continues to meet inpatient criteria given current IV medication needed as well as requiring GI consultation for EGD/colonoscopy     Time spent: 45 minutes taking care of this patient as well as Case discussion with nephrologist, gastroenterologist, and anesthesiologist as well as nursing staff        Physical Exam: Vitals:   04/10/23 2017 04/11/23 0339 04/11/23 0449 04/11/23 1057  BP: 120/64  (!) 110/58 139/72  Pulse: 73  86 88  Resp: 16  16   Temp: 98.1 F (36.7 C)  98.1 F (36.7 C) 97.7 F (36.5 C)  TempSrc: Oral  Oral   SpO2: 96%  94% 94%  Weight:  67.2 kg    Height:        Author: Loyce Dys, MD 04/11/2023 3:02 PM  For on call review [email or web address].

## 2023-04-11 NOTE — Care Management Important Message (Signed)
Important Message  Patient Details  Name: Emma Gould MRN: [id number] Date of Birth: 1956/12/25   Medicare Important Message Given:  Yes     Johnell Comings 04/11/2023, 11:59 AM

## 2023-04-11 NOTE — Progress Notes (Signed)
[facility]  ROUNDING NOTE   Subjective:   Patient seen laying in bed States she feels better today, stronger Reports decreased diarrheal episodes.  Clear liquid diet.  Objective:  Vital signs in last 24 hours:  Temp:  [97.7 F (36.5 C)-98.1 F (36.7 C)] 97.7 F (36.5 C) (04/15 1057) Pulse Rate:  [73-88] 88 (04/15 1057) Resp:  [16] 16 (04/15 0449) BP: (110-144)/(58-81) 139/72 (04/15 1057) SpO2:  [94 %-100 %] 94 % (04/15 1057) Weight:  [67.2 kg] 67.2 kg (04/15 0339)  Weight change: 3.4 kg Filed Weights   04/26/2023 0500 04/10/23 0500 04/11/23 0339  Weight: 66.6 kg 63.8 kg 67.2 kg    Intake/Output: I/O last 3 completed shifts: In: 2845.4 [P.O.:620; I.V.:1449.6; IV Piggyback:775.8] Out: -    Intake/Output this shift:  Total I/O In: 204.3 [P.O.:90; IV Piggyback:114.3] Out: -   Physical Exam: General: NAD  Head: Normocephalic, atraumatic. Moist oral mucosal membranes  Eyes: Anicteric  Lungs:  Clear to auscultation, normal effort  Heart: Regular rate and rhythm  Abdomen:  Soft, nontender  Extremities:  No peripheral edema.  Neurologic: Nonfocal, moving all four extremities  Skin: No lesions  Access: None    Basic Metabolic Panel: Recent Labs  Lab 04/02/2023 1415 04/08/23 0515 04/08/23 0523 04/08/23 1802 04/05/2023 0904 04/07/2023 1236 03/30/2023 1746 04/10/23 0447 04/11/23 0055 04/11/23 0525 04/11/23 0830  NA 135  --  132*  --  140  --   --  137  --  136  --   K 3.7  --  2.4*   < > 2.7*   < > 3.0* 2.5* 3.4* 3.7 3.7  CL 105  --  94*  --  98  --   --  103  --  107  --   CO2 14*  --  27  --  27  --   --  23  --  21*  --   GLUCOSE 131*  --  90  --  87  --   --  67*  --  77  --   BUN 35*  --  34*  --  31*  --   --  28*  --  19  --   CREATININE 3.17*  --  2.86*  --  2.84*  --   --  2.49*  --  1.73*  --   CALCIUM 8.9  --  7.2*  --  7.4*  --   --  6.9*  --  7.0*  --   MG  --  1.2*  --   --  1.0*  --   --  0.9*  --  2.9*  --   PHOS  --   --   --   --   3.5  --   --  3.4  --  2.3*  --    < > = values in this interval not displayed.    Liver Function Tests: Recent Labs  Lab 04/06/2023 1415 04/08/23 0523  AST 38 34  ALT 19 15  ALKPHOS 173* 141*  BILITOT 0.8 0.6  PROT 6.2* 4.9*  ALBUMIN 2.3* 1.9*   Recent Labs  Lab 04/12/2023 1415  LIPASE 27   No results for input(s): "AMMONIA" in the last 168 hours.  CBC: Recent Labs  Lab 04/05/2023 1415 04/08/23 0523 04/10/23 0447 04/11/23 0525  WBC 16.3* 13.4* 12.0* 14.0*  NEUTROABS 9.6*  --  6.3 9.0*  HGB 15.6* 12.4 12.2 11.5*  HCT 47.5* 36.3 36.4 34.2*  MCV 103.0* 98.9 99.5 99.4  PLT 199 148* 114* 116*    Cardiac Enzymes: No results for input(s): "CKTOTAL", "CKMB", "CKMBINDEX", "TROPONINI" in the last 168 hours.  BNP: Invalid input(s): "POCBNP"  CBG: Recent Labs  Lab 04/17/2023 2133 04/10/23 0734 04/10/23 1611 04/10/23 1750 04/10/23 2129  GLUCAP 112* 143* 115* 131* 116*    Microbiology: Results for orders placed or performed during the hospital encounter of 04/20/2023  Gastrointestinal Panel by PCR , Stool     Status: None   Collection Time: 04/08/23 11:40 AM   Specimen: Stool  Result Value Ref Range Status   Campylobacter species NOT DETECTED NOT DETECTED Final   Plesimonas shigelloides NOT DETECTED NOT DETECTED Final   Salmonella species NOT DETECTED NOT DETECTED Final   Yersinia enterocolitica NOT DETECTED NOT DETECTED Final   Vibrio species NOT DETECTED NOT DETECTED Final   Vibrio cholerae NOT DETECTED NOT DETECTED Final   Enteroaggregative E coli (EAEC) NOT DETECTED NOT DETECTED Final   Enteropathogenic E coli (EPEC) NOT DETECTED NOT DETECTED Final   Enterotoxigenic E coli (ETEC) NOT DETECTED NOT DETECTED Final   Shiga like toxin producing E coli (STEC) NOT DETECTED NOT DETECTED Final   Shigella/Enteroinvasive E coli (EIEC) NOT DETECTED NOT DETECTED Final   Cryptosporidium NOT DETECTED NOT DETECTED Final   Cyclospora cayetanensis NOT DETECTED NOT DETECTED Final    Entamoeba histolytica NOT DETECTED NOT DETECTED Final   Giardia lamblia NOT DETECTED NOT DETECTED Final   Adenovirus F40/41 NOT DETECTED NOT DETECTED Final   Astrovirus NOT DETECTED NOT DETECTED Final   Norovirus GI/GII NOT DETECTED NOT DETECTED Final   Rotavirus A NOT DETECTED NOT DETECTED Final   Sapovirus (I, II, IV, and V) NOT DETECTED NOT DETECTED Final    Comment: Performed at [facility], [address] [place], [place] [address]  C Difficile Quick Screen w PCR reflex     Status: None   Collection Time: 04/08/23 11:40 AM   Specimen: STOOL  Result Value Ref Range Status   C Diff antigen NEGATIVE NEGATIVE Final   C Diff toxin NEGATIVE NEGATIVE Final   C Diff interpretation No C. difficile detected.  Final    Comment: Performed at [facility], [address] [place], [place] [address]    Coagulation Studies: No results for input(s): "LABPROT", "INR" in the last 72 hours.  Urinalysis: No results for input(s): "COLORURINE", "LABSPEC", "PHURINE", "GLUCOSEU", "HGBUR", "BILIRUBINUR", "KETONESUR", "PROTEINUR", "UROBILINOGEN", "NITRITE", "LEUKOCYTESUR" in the last 72 hours.  Invalid input(s): "APPERANCEUR"    Imaging: [place] EKG SITE RITE  Result Date: 04/10/2023 If Site Rite image not attached, placement could not be confirmed due to current cardiac rhythm.    Medications:    sodium chloride 20 mL/hr at 04/18/2023 1043   sodium chloride 20 mL/hr at 04/10/23 0520   sodium chloride 75 mL/hr at 04/11/23 0837   cefTRIAXone (ROCEPHIN)  IV Stopped (04/10/23 2136)   metronidazole 500 mg (04/11/23 1035)    vitamin C  500 mg Oral BID   carvedilol  6.25 mg Oral BID WC   Chlorhexidine Gluconate Cloth  6 each Topical Daily   enoxaparin (LOVENOX) injection  30 mg Subcutaneous QHS   ezetimibe  10 mg Oral Daily   feeding supplement  1 Container Oral TID BM   lipase/protease/amylase  72,000 Units Oral TID WC   multivitamin with minerals  1 tablet Oral Daily    pantoprazole  40 mg Oral Daily   QUEtiapine  50 mg Oral BID  sertraline  100 mg Oral Daily   sodium chloride flush  10-40 mL Intracatheter Q12H   spironolactone  12.5 mg Oral Daily   topiramate  50 mg Oral BID   acetaminophen **OR** acetaminophen, ondansetron **OR** ondansetron (ZOFRAN) IV, oxyCODONE-acetaminophen, sodium chloride flush, SUMAtriptan  Assessment/ Plan:  Ms. Emma Gould is a 67 y.o.  female  with past medical history consistent with hypertension, nonobstructive CAD, diastolic heart failure, and chronic kidney disease,, who was admitted to [facility] on 04/05/2023 for Colitis [K52.9] Acute colitis [K52.9] AKI (acute kidney injury) [N17.9]   Acute kidney injury on chronic kidney disease stage IIIa. Baseline creatinine appears to be 1.21 with GFR 50 on 02/26/2023. Acute kidney injury secondary to hypovolemia due to persistent diarrhea. No IV contrast exposure. Creatinine 3.17 on ED admission. Creatinine has improved with IVFs.  No acute indication for dialysis Avoid nephrotoxic agents at this time  Lab Results  Component Value Date   CREATININE 1.73 (H) 04/11/2023   CREATININE 2.49 (H) 04/10/2023   CREATININE 2.84 (H) 04/06/2023    Intake/Output Summary (Last 24 hours) at 04/11/2023 1501 Last data filed at 04/11/2023 1444 Gross per 24 hour  Intake 2091.39 ml  Output --  Net 2091.39 ml   2.  Severe hypokalemia/hypomagnesia.  Likely due to persistent diarrhea.  Potassium and magnesium corrected with aggressive management. Hypermagnesia present but will stabilize.    3.  Hypertension with chronic kidney disease.  Home regimen includes carvedilol, ezetimbre, spironolactone, and torsemide.  Receiving all medications. Blood pressure stable.    4.  Chronic diarrhea, etiology unknown.  C. difficile negative.  GI panel negative.  GI consulted and planning EGD/Colonoscopy once electrolytes stable.     LOS: 4   4/15/20243:01 PM

## 2023-04-12 ENCOUNTER — Inpatient Hospital Stay: Payer: Medicare Other | Admitting: Anesthesiology

## 2023-04-12 ENCOUNTER — Encounter: Payer: Self-pay | Admitting: Internal Medicine

## 2023-04-12 ENCOUNTER — Encounter: Admission: EM | Disposition: E | Payer: Self-pay | Source: Home / Self Care | Attending: Internal Medicine

## 2023-04-12 DIAGNOSIS — K529 Noninfective gastroenteritis and colitis, unspecified: Secondary | ICD-10-CM | POA: Diagnosis not present

## 2023-04-12 DIAGNOSIS — K635 Polyp of colon: Secondary | ICD-10-CM

## 2023-04-12 HISTORY — PX: COLONOSCOPY: SHX5424

## 2023-04-12 HISTORY — PX: ESOPHAGOGASTRODUODENOSCOPY (EGD) WITH PROPOFOL: SHX5813

## 2023-04-12 LAB — BASIC METABOLIC PANEL
Anion gap: 10 (ref 5–15)
BUN: 15 mg/dL (ref 8–23)
CO2: 17 mmol/L — ABNORMAL LOW (ref 22–32)
Calcium: 6.9 mg/dL — ABNORMAL LOW (ref 8.9–10.3)
Chloride: 110 mmol/L (ref 98–111)
Creatinine, Ser: 1.46 mg/dL — ABNORMAL HIGH (ref 0.44–1.00)
GFR, Estimated: 40 mL/min — ABNORMAL LOW (ref >60)
Glucose, Bld: 102 mg/dL — ABNORMAL HIGH (ref 70–99)
Potassium: 2.8 mmol/L — ABNORMAL LOW (ref 3.5–5.1)
Sodium: 137 mmol/L (ref 135–145)

## 2023-04-12 LAB — VITAMIN C: Vitamin C: <0.1 mg/dL — ABNORMAL LOW (ref 0.4–2.0)

## 2023-04-12 LAB — CBC WITH DIFFERENTIAL/PLATELET
Abs Immature Granulocytes: 0.85 K/uL — ABNORMAL HIGH (ref 0.00–0.07)
Basophils Absolute: 0.1 K/uL (ref 0.0–0.1)
Basophils Relative: 1 %
Eosinophils Absolute: 0.1 K/uL (ref 0.0–0.5)
Eosinophils Relative: 0 %
HCT: 31.8 % — ABNORMAL LOW (ref 36.0–46.0)
Hemoglobin: 10.8 g/dL — ABNORMAL LOW (ref 12.0–15.0)
Immature Granulocytes: 6 %
Lymphocytes Relative: 15 %
Lymphs Abs: 2.2 K/uL (ref 0.7–4.0)
MCH: 33.9 pg (ref 26.0–34.0)
MCHC: 34 g/dL (ref 30.0–36.0)
MCV: 99.7 fL (ref 80.0–100.0)
Monocytes Absolute: 1.2 K/uL — ABNORMAL HIGH (ref 0.1–1.0)
Monocytes Relative: 9 %
Neutro Abs: 10.1 K/uL — ABNORMAL HIGH (ref 1.7–7.7)
Neutrophils Relative %: 69 %
Platelets: 94 K/uL — ABNORMAL LOW (ref 150–400)
RBC: 3.19 MIL/uL — ABNORMAL LOW (ref 3.87–5.11)
RDW: 14.4 % (ref 11.5–15.5)
Smear Review: NORMAL
WBC: 14.5 K/uL — ABNORMAL HIGH (ref 4.0–10.5)
nRBC: 0.3 % — ABNORMAL HIGH (ref 0.0–0.2)

## 2023-04-12 LAB — ZINC: Zinc: 40 ug/dL — ABNORMAL LOW (ref 44–115)

## 2023-04-12 LAB — GLUCOSE, CAPILLARY
Glucose-Capillary: 75 mg/dL (ref 70–99)
Glucose-Capillary: 65 mg/dL — ABNORMAL LOW (ref 70–99)
Glucose-Capillary: 93 mg/dL (ref 70–99)
Glucose-Capillary: 86 mg/dL (ref 70–99)

## 2023-04-12 LAB — MAGNESIUM: Magnesium: 1.8 mg/dL (ref 1.7–2.4)

## 2023-04-12 LAB — CELIAC DISEASE PANEL
Endomysial Ab, IgA: NEGATIVE
IgA: 305 mg/dL (ref 87–352)
Tissue Transglutaminase Ab, IgA: <2 U/mL (ref 0–3)

## 2023-04-12 LAB — POTASSIUM: Potassium: 3.3 mmol/L — ABNORMAL LOW (ref 3.5–5.1)

## 2023-04-12 SURGERY — ESOPHAGOGASTRODUODENOSCOPY (EGD) WITH PROPOFOL
Anesthesia: General

## 2023-04-12 MED ORDER — POTASSIUM CHLORIDE 10 MEQ/100ML IV SOLN
INTRAVENOUS | Status: AC
  Filled 2023-04-12: qty 100

## 2023-04-12 MED ORDER — ENOXAPARIN SODIUM 40 MG/0.4ML IJ SOSY
40.0000 mg | PREFILLED_SYRINGE | Freq: Every day | INTRAMUSCULAR | Status: DC
  Administered 2023-04-12 – 2023-04-13 (×2): 40 mg via SUBCUTANEOUS
  Filled 2023-04-12 (×2): qty 0.4

## 2023-04-12 MED ORDER — POTASSIUM CHLORIDE 10 MEQ/100ML IV SOLN
10.0000 meq | INTRAVENOUS | Status: AC
  Administered 2023-04-12 (×4): 10 meq via INTRAVENOUS
  Filled 2023-04-12 (×2): qty 100

## 2023-04-12 MED ORDER — ZINC SULFATE 220 (50 ZN) MG PO CAPS
220.0000 mg | ORAL_CAPSULE | Freq: Every day | ORAL | Status: DC
  Administered 2023-04-13 – 2023-04-14 (×2): 220 mg via ORAL
  Filled 2023-04-12 (×2): qty 1

## 2023-04-12 MED ORDER — MORPHINE SULFATE (PF) 2 MG/ML IV SOLN
1.0000 mg | Freq: Once | INTRAVENOUS | Status: AC
  Administered 2023-04-12: 1 mg via INTRAVENOUS
  Filled 2023-04-12: qty 1

## 2023-04-12 MED ORDER — POTASSIUM CHLORIDE CRYS ER 20 MEQ PO TBCR: 40.0000 meq | EXTENDED_RELEASE_TABLET | ORAL | Status: DC

## 2023-04-12 MED ORDER — PROPOFOL 10 MG/ML IV BOLUS
INTRAVENOUS | Status: DC | PRN
  Administered 2023-04-12: 150 ug/kg/min via INTRAVENOUS

## 2023-04-12 MED ORDER — PHENYLEPHRINE HCL (PRESSORS) 10 MG/ML IV SOLN
INTRAVENOUS | Status: DC | PRN
  Administered 2023-04-12 (×2): 160 ug via INTRAVENOUS

## 2023-04-12 NOTE — Plan of Care (Signed)
Pt alert and oriented x 4. Vitals stable. Potassium continues to drop after bowel prep during overnight. Msg sent to on call provider K. Foust regarding drop. Msg also sent regarding npo status and pt complaining of pain. Morphine was ordered x 1 dose and given. Pt had bm this am remains brown thin. Pt unable tolerate full prep and refused to take anymore. Pt stated she took prep Friday. Chart indicated pt took miralax bottle prep Friday. Pt has been on clearing and continues to have nausea and received nausea x 2 this shift.  Problem: Clinical Measurements: Goal: Diagnostic test results will improve Outcome: Not Progressing   Problem: Education: Goal: Knowledge of General Education information will improve Description: Including pain rating scale, medication(s)/side effects and non-pharmacologic comfort measures Outcome: Progressing   Problem: Health Behavior/Discharge Planning: Goal: Ability to manage health-related needs will improve Outcome: Progressing   Problem: Clinical Measurements: Goal: Ability to maintain clinical measurements within normal limits will improve Outcome: Progressing Goal: Will remain free from infection Outcome: Progressing Goal: Respiratory complications will improve Outcome: Progressing Goal: Cardiovascular complication will be avoided Outcome: Progressing   Problem: Activity: Goal: Risk for activity intolerance will decrease Outcome: Progressing   Problem: Nutrition: Goal: Adequate nutrition will be maintained Outcome: Progressing   Problem: Coping: Goal: Level of anxiety will decrease Outcome: Progressing   Problem: Elimination: Goal: Will not experience complications related to bowel motility Outcome: Progressing Goal: Will not experience complications related to urinary retention Outcome: Progressing   Problem: Pain Managment: Goal: General experience of comfort will improve Outcome: Progressing   Problem: Safety: Goal: Ability to remain  free from injury will improve Outcome: Progressing   Problem: Skin Integrity: Goal: Risk for impaired skin integrity will decrease Outcome: Progressing

## 2023-04-12 NOTE — Progress Notes (Signed)
Progress Note   Patient: Emma Gould [id number]:[id number] DOB: 02-07-1956 DOA: 04/14/2023     5 DOS: the patient was seen and examined on 04/07/2023    Subjective:  Abdominal pain is improving She admits to improvement in abdominal pain Underwent EGD/colonoscopy today EGD showed gastritis and colonoscopy showed pancolitis biopsies were taken.  Also showed polyp that was removed and nonbleeding internal hemorrhoids. Denies nausea or vomiting     Brief hospital course:  Emma Gould is a 67 y.o. female with medical history significant for hypertension, , CKD llla, nonobstructive CAD, dCHF, anxiety, with a 43-month history of chronic diarrhea, hospitalized early March 2024 at [facility] with critically low potassium of less than 2 and 3/18-3/22 at [facility] for noninfectious colitis complicated by AKI and hypokalemia, discharged on loperamide and Bentyl and referred to GI, who presents to the ED with a 2-week complaint of abdominal pain, generalized weakness, resulting in frequent falls, most recently on the day of arrival, not resulting in any injury.  She denies fever, chills, dysuria and denies cough, chest pain or shortness of breath.  Patient has persistent nausea and intermittent vomiting to where it is difficult to keep anything on and has associated epigastric discomfort.  Vomiting is nonbloody nonbilious and non-coffee-ground.  His stool is dark, liquid.  She has not seen a gastroenterologist for this problem. In the emergency room CT abdomen and pelvis shows findings that represent mild colitis infectious or inflammatory.   Assessment and Plan:   Acute pancolitis Chronic diarrhea of unknown origin Patient presents with nausea vomiting and diarrhea, similar to hospitalization at [facility] from 3/18 to 3/22 GI infectious workup was negative with negative GI panel negative ova and parasites and negative C. difficile CT abdomen and pelvis showed colitis back in March and now shows mild colitis Patient has not  yet followed up with GI - GI on board we appreciate input - Stool studies within normal limits - IV hydration and supportive care - Continue pancreatic enzymes as recommended by GI Underwent EGD/colonoscopy today; 04/05/2023 EGD showed gastritis and colonoscopy showed pancolitis biopsies were taken.  Also showed polyp that was removed and nonbleeding internal hemorrhoids.   Acute renal failure superimposed on stage 3a chronic kidney disease Increased anion gap metabolic acidosis-improved Secondary to chronic diarrhea - Continue IV hydration  - Monitor and replace electrolytes - Diuretics was discontinued at last discharge from [facility] 3/22 -Symptomatic care with antidiarrheals Nephrologist on board we appreciate input   Generalized weakness Protein calorie malnutrition, suspected Frequent falls Secondary to ongoing chronic diarrhea, poor oral intake - Dietary consult - PT evaluation   Severe hypokalemia-continue repletion and monitoring secondary to ongoing chronic diarrhea Continue aggressive replacement therapy as needed PICC line in place-this was placed to assist with electrolyte correction and IV fluid need in the setting of severe electrolyte derangements Monitor electrolytes closely   Severe hypomagnesemia Continue aggressive magnesium replacement as needed   Chronic diastolic CHF (congestive heart failure) Echo 12/10/2022 EF 60-65% and RHC revealed diastolic dysfunction  Monitor for fluid overload in view of IV hydration   Coronary artery disease involving native coronary artery of native heart with angina pectoris History of nonobstructive CAD Continue carvedilol and ezetimibe   Migraine Continue Topamax and sumatriptan   Depression Continue sertraline       DVT prophylaxis: Lovenox   Consults: Gastroenterology   Advance Care Planning:   Code Status: Prior      Physical Exam: Vitals and nursing note reviewed.  Constitutional:  General: She is not in  acute distress.    Comments: Frail and chronically ill-appearing  HENT:     Head: Normocephalic and atraumatic.  Cardiovascular:     Rate and Rhythm: Normal rate and regular rhythm.     Heart sounds: Normal heart sounds.  Pulmonary:     Effort: Pulmonary effort is normal.     Breath sounds: Normal breath sounds.  Abdominal:     Palpations: Abdomen is soft.     Tenderness: Tenderness appreciated in the mid abdomen Neurological:     Mental Status: Mental status is at baseline.      Data Reviewed: Laboratory results reviewed with improvement in potassium level to 3.3 today and magnesium of 1.8   Family Communication: None present at bedside at this time   Disposition: Status is: Inpatient Patient still continues to meet inpatient criteria given current IV medication needed as well as requiring GI consultation for EGD/colonoscopy     Time spent: 45 minutes taking care of this patient as well as Case discussion with nephrologist, gastroenterologist, and anesthesiologist as well as nursing staff      Vitals:   03/28/2023 0820 04/20/2023 1231 04/17/2023 1241 03/30/2023 1253  BP: 125/65 (!) 96/56 (!) 96/57 120/68  Pulse: (!) 105 92 90   Resp: 20 (!) 21 20   Temp: 98.1 F (36.7 C)  (!) 96.7 F (35.9 C)   TempSrc: Oral  Temporal   SpO2: 96% 92% 96% 92%  Weight:      Height:        Author: Loyce Dys, MD 04/08/2023 5:03 PM  For on call review [email or web address].

## 2023-04-12 NOTE — Op Note (Signed)
[facility] Gastroenterology Patient Name: Emma Gould Procedure Date: 04/08/2023 11:51 AM MRN: [id number] Account #: [id number] Date of Birth: [birth year] Admit Type: Outpatient Age: 67 Room: [facility] ENDO ROOM 3 Gender: Female Note Status: Finalized Instrument Name: Prentice Docker [id number] Procedure:             Colonoscopy Indications:           Chronic diarrhea Providers:             Midge Minium MD, MD Medicines:             Propofol per Anesthesia Complications:         No immediate complications. Procedure:             Pre-Anesthesia Assessment:                        - Prior to the procedure, a History and Physical was                         performed, and patient medications and allergies were                         reviewed. The patient's tolerance of previous                         anesthesia was also reviewed. The risks and benefits                         of the procedure and the sedation options and risks                         were discussed with the patient. All questions were                         answered, and informed consent was obtained. Prior                         Anticoagulants: The patient has taken no anticoagulant                         or antiplatelet agents. ASA Grade Assessment: II - A                         patient with mild systemic disease. After reviewing                         the risks and benefits, the patient was deemed in                         satisfactory condition to undergo the procedure.                        After obtaining informed consent, the colonoscope was                         passed under direct vision. Throughout the procedure,                         the patient's blood pressure, pulse, and  oxygen                         saturations were monitored continuously. The                         Colonoscope was introduced through the anus and                         advanced to the the terminal ileum. The  colonoscopy                         was performed without difficulty. The patient                         tolerated the procedure well. The quality of the bowel                         preparation was adequate to identify polyps. Findings:      The perianal and digital rectal examinations were normal.      The terminal ileum appeared normal. Biopsies were taken with a cold       forceps for histology.      Diffuse severe inflammation characterized by erythema and shallow       ulcerations was found in the entire colon. Biopsies were taken with a       cold forceps for histology.      An 8 mm polyp was found in the ascending colon. The polyp was sessile.       The polyp was removed with a cold snare. Resection and retrieval were       complete.      Multiple polyps vs. pseudopolyps seen      Non-bleeding internal hemorrhoids were found during retroflexion. The       hemorrhoids were Grade II (internal hemorrhoids that prolapse but reduce       spontaneously). Impression:            - The examined portion of the ileum was normal.                         Biopsied.                        - Diffuse severe inflammation was found in the entire                         examined colon secondary to pancolitis. Biopsied.                        - One 8 mm polyp in the ascending colon, removed with                         a cold snare. Resected and retrieved.                        - Multiple polyps vs. pseudopolyps seen                        - Non-bleeding internal hemorrhoids. Recommendation:        - Return patient to hospital ward for  ongoing care.                        - Resume previous diet.                        - Continue present medications.                        - Await pathology results.                        - Repeat colonoscopy After treatent for underlying                         disease to evaluate for polyps. Procedure Code(s):     --- Professional ---                         [phone], Colonoscopy, flexible; with removal of                         tumor(s), polyp(s), or other lesion(s) by snare                         technique                        [id number], 59, Colonoscopy, flexible; with biopsy, single                         or multiple Diagnosis Code(s):     --- Professional ---                        K52.9, Noninfective gastroenteritis and colitis,                         unspecified CPT copyright 2022 American Medical Association. All rights reserved. The codes documented in this report are preliminary and upon coder review may  be revised to meet current compliance requirements. Midge Minium MD, MD 04/11/2023 12:27:41 PM This report has been signed electronically. Number of Addenda: 0 Note Initiated On: 03/28/2023 11:51 AM Scope Withdrawal Time: 0 hours 5 minutes 21 seconds  Total Procedure Duration: 0 hours 10 minutes 34 seconds  Estimated Blood Loss:  Estimated blood loss: none.      [facility]

## 2023-04-12 NOTE — Transfer of Care (Signed)
Immediate Anesthesia Transfer of Care Note  Patient: Emma Gould  Procedure(s) Performed: ESOPHAGOGASTRODUODENOSCOPY (EGD) WITH PROPOFOL COLONOSCOPY  Patient Location: PACU  Anesthesia Type:General  Level of Consciousness: awake and alert   Airway & Oxygen Therapy: Patient Spontanous Breathing and Patient connected to nasal cannula oxygen  Post-op Assessment: Report given to RN and Post -op Vital signs reviewed and stable  Post vital signs: Reviewed and stable  Last Vitals:  Vitals Value Taken Time  BP    Temp    Pulse    Resp    SpO2      Last Pain:  Vitals:   04/23/2023 0915  TempSrc:   PainSc: 7       Patients Stated Pain Goal: 2 (04/11/23 1057)  Complications: No notable events documented.

## 2023-04-12 NOTE — Progress Notes (Signed)
[facility]  ROUNDING NOTE   Subjective:   Patient seen resting in bed Voices frustration over canceled procedure yesterday with continued symptoms Currently n.p.o. for EGD/colonoscopy.  Objective:  Vital signs in last 24 hours:  Temp:  [97.6 F (36.4 C)-98.2 F (36.8 C)] 98.1 F (36.7 C) (04/16 0820) Pulse Rate:  [88-105] 92 (04/16 1231) Resp:  [16-21] 21 (04/16 1231) BP: (96-125)/(56-73) 96/56 (04/16 1231) SpO2:  [92 %-97 %] 92 % (04/16 1231) Weight:  [63.9 kg] 63.9 kg (04/16 0430)  Weight change: -3.3 kg Filed Weights   04/10/23 0500 04/11/23 0339 04/20/2023 0430  Weight: 63.8 kg 67.2 kg 63.9 kg    Intake/Output: I/O last 3 completed shifts: In: 3269.6 [P.O.:210; I.V.:2159.6; IV Piggyback:900] Out: -    Intake/Output this shift:  Total I/O In: 400 [I.V.:400] Out: -   Physical Exam: General: NAD  Head: Normocephalic, atraumatic. Moist oral mucosal membranes  Eyes: Anicteric  Lungs:  Clear to auscultation, normal effort  Heart: Regular rate and rhythm  Abdomen:  Soft, nontender  Extremities:  No peripheral edema.  Neurologic: Alert and oriented, moving all four extremities  Skin: No lesions  Access: None    Basic Metabolic Panel: Recent Labs  Lab 04/08/23 0515 04/08/23 0523 04/08/23 1802 04/14/2023 0904 04/22/2023 1236 04/10/23 0447 04/11/23 0055 04/11/23 0525 04/11/23 0830 04/11/23 2306 04/01/2023 0358 03/30/2023 0800 04/04/2023 0847  NA  --  132*  --  140  --  137  --  136  --   --  137  --   --   K  --  2.4*   < > 2.7*   < > 2.5*   < > 3.7 3.7 3.0* 2.8* 3.3*  --   CL  --  94*  --  98  --  103  --  107  --   --  110  --   --   CO2  --  27  --  27  --  23  --  21*  --   --  17*  --   --   GLUCOSE  --  90  --  87  --  67*  --  77  --   --  102*  --   --   BUN  --  34*  --  31*  --  28*  --  19  --   --  15  --   --   CREATININE  --  2.86*  --  2.84*  --  2.49*  --  1.73*  --   --  1.46*  --   --   CALCIUM  --  7.2*  --  7.4*  --  6.9*  --   7.0*  --   --  6.9*  --   --   MG 1.2*  --   --  1.0*  --  0.9*  --  2.9*  --   --   --   --  1.8  PHOS  --   --   --  3.5  --  3.4  --  2.3*  --   --   --   --   --    < > = values in this interval not displayed.     Liver Function Tests: Recent Labs  Lab 20-Apr-2023 1415 04/08/23 0523  AST 38 34  ALT 19 15  ALKPHOS 173* 141*  BILITOT 0.8 0.6  PROT 6.2* 4.9*  ALBUMIN 2.3* 1.9*  Recent Labs  Lab 04/03/2023 1415  LIPASE 27    No results for input(s): "AMMONIA" in the last 168 hours.  CBC: Recent Labs  Lab 04/03/2023 1415 04/08/23 0523 04/10/23 0447 04/11/23 0525 04/01/2023 0358  WBC 16.3* 13.4* 12.0* 14.0* 14.5*  NEUTROABS 9.6*  --  6.3 9.0* 10.1*  HGB 15.6* 12.4 12.2 11.5* 10.8*  HCT 47.5* 36.3 36.4 34.2* 31.8*  MCV 103.0* 98.9 99.5 99.4 99.7  PLT 199 148* 114* 116* 94*     Cardiac Enzymes: No results for input(s): "CKTOTAL", "CKMB", "CKMBINDEX", "TROPONINI" in the last 168 hours.  BNP: Invalid input(s): "POCBNP"  CBG: Recent Labs  Lab 04/10/23 2129 04/11/23 1646 04/11/23 2103 04/01/2023 0820 04/13/2023 1138  GLUCAP 116* 149* 174* 75 65*     Microbiology: Results for orders placed or performed during the hospital encounter of 04/15/2023  Gastrointestinal Panel by PCR , Stool     Status: None   Collection Time: 04/08/23 11:40 AM   Specimen: Stool  Result Value Ref Range Status   Campylobacter species NOT DETECTED NOT DETECTED Final   Plesimonas shigelloides NOT DETECTED NOT DETECTED Final   Salmonella species NOT DETECTED NOT DETECTED Final   Yersinia enterocolitica NOT DETECTED NOT DETECTED Final   Vibrio species NOT DETECTED NOT DETECTED Final   Vibrio cholerae NOT DETECTED NOT DETECTED Final   Enteroaggregative E coli (EAEC) NOT DETECTED NOT DETECTED Final   Enteropathogenic E coli (EPEC) NOT DETECTED NOT DETECTED Final   Enterotoxigenic E coli (ETEC) NOT DETECTED NOT DETECTED Final   Shiga like toxin producing E coli (STEC) NOT DETECTED NOT DETECTED  Final   Shigella/Enteroinvasive E coli (EIEC) NOT DETECTED NOT DETECTED Final   Cryptosporidium NOT DETECTED NOT DETECTED Final   Cyclospora cayetanensis NOT DETECTED NOT DETECTED Final   Entamoeba histolytica NOT DETECTED NOT DETECTED Final   Giardia lamblia NOT DETECTED NOT DETECTED Final   Adenovirus F40/41 NOT DETECTED NOT DETECTED Final   Astrovirus NOT DETECTED NOT DETECTED Final   Norovirus GI/GII NOT DETECTED NOT DETECTED Final   Rotavirus A NOT DETECTED NOT DETECTED Final   Sapovirus (I, II, IV, and V) NOT DETECTED NOT DETECTED Final    Comment: Performed at [facility], [address] [place], [place] [address]  C Difficile Quick Screen w PCR reflex     Status: None   Collection Time: 04/08/23 11:40 AM   Specimen: STOOL  Result Value Ref Range Status   C Diff antigen NEGATIVE NEGATIVE Final   C Diff toxin NEGATIVE NEGATIVE Final   C Diff interpretation No C. difficile detected.  Final    Comment: Performed at [facility], [address] [place], [place] [address]    Coagulation Studies: No results for input(s): "LABPROT", "INR" in the last 72 hours.  Urinalysis: No results for input(s): "COLORURINE", "LABSPEC", "PHURINE", "GLUCOSEU", "HGBUR", "BILIRUBINUR", "KETONESUR", "PROTEINUR", "UROBILINOGEN", "NITRITE", "LEUKOCYTESUR" in the last 72 hours.  Invalid input(s): "APPERANCEUR"    Imaging: No results found.   Medications:    sodium chloride 20 mL/hr at 04/21/2023 1043   sodium chloride 20 mL/hr at 04/18/2023 1204   sodium chloride 75 mL/hr at 04/24/2023 0200    [MAR Hold] vitamin C  500 mg Oral BID   [MAR Hold] carvedilol  6.25 mg Oral BID WC   [MAR Hold] Chlorhexidine Gluconate Cloth  6 each Topical Daily   [MAR Hold] cholecalciferol  1,000 Units Oral Daily   [MAR Hold] enoxaparin (LOVENOX) injection  40 mg Subcutaneous QHS   [  MAR Hold] ezetimibe  10 mg Oral Daily   [MAR Hold] feeding supplement  1 Container Oral TID BM   [MAR Hold] folic  acid  1 mg Oral Daily   [MAR Hold] lipase/protease/amylase  72,000 Units Oral TID WC   [MAR Hold] multivitamin with minerals  1 tablet Oral Daily   [MAR Hold] pantoprazole  40 mg Oral Daily   [MAR Hold] QUEtiapine  50 mg Oral BID   [MAR Hold] sertraline  100 mg Oral Daily   [MAR Hold] sodium chloride flush  10-40 mL Intracatheter Q12H   [MAR Hold] spironolactone  12.5 mg Oral Daily   [MAR Hold] topiramate  50 mg Oral BID   [MAR Hold] acetaminophen **OR** [MAR Hold] acetaminophen, [MAR Hold] ondansetron **OR** [MAR Hold] ondansetron (ZOFRAN) IV, [MAR Hold] oxyCODONE-acetaminophen, [MAR Hold] sodium chloride flush, [MAR Hold] SUMAtriptan  Assessment/ Plan:  Ms. Emma Gould is a 67 y.o.  female  with past medical history consistent with hypertension, nonobstructive CAD, diastolic heart failure, and chronic kidney disease,, who was admitted to [facility] on 03/28/2023 for Colitis [K52.9] Acute colitis [K52.9] AKI (acute kidney injury) [N17.9]   Acute kidney injury on chronic kidney disease stage IIIa. Baseline creatinine appears to be 1.21 with GFR 50 on 02/26/2023. Acute kidney injury secondary to hypovolemia due to persistent diarrhea. No IV contrast exposure. Creatinine 3.17 on ED admission. Creatinine continues to improve with IV fluids   Lab Results  Component Value Date   CREATININE 1.46 (H) 04/11/2023   CREATININE 1.73 (H) 04/11/2023   CREATININE 2.49 (H) 04/10/2023    Intake/Output Summary (Last 24 hours) at 04/23/2023 1242 Last data filed at 04/06/2023 1225 Gross per 24 hour  Intake 2601.59 ml  Output --  Net 2601.59 ml    2.  Severe hypokalemia/hypomagnesia.  Likely due to persistent diarrhea.  Potassium decreased to 2.8 today.  Patient currently receiving IV potassium chloride 40 mill equivalents and will receive oral supplementation as well.   3.  Hypertension with chronic kidney disease.  Home regimen includes carvedilol, ezetimbre, spironolactone, and torsemide.  Receiving all  medications. Blood pressure soft today.   4.  Chronic diarrhea, etiology unknown.  C. difficile negative.  GI panel negative.  GI consulted and planning EGD/Colonoscopy later today.    LOS: 5   4/16/202412:42 PM

## 2023-04-12 NOTE — Op Note (Signed)
[facility] Gastroenterology Patient Name: Emma Gould Procedure Date: April 17, 2023 11:51 AM MRN: [id number] Account #: [id number] Date of Birth: Nov 02, 1956 Admit Type: Outpatient Age: 67 Room: [facility] ENDO ROOM 3 Gender: Female Note Status: Finalized Instrument Name: Patton Salles Endoscope [id number] Procedure:             Upper GI endoscopy Indications:           Diarrhea Providers:             Midge Minium MD, MD Medicines:             Propofol per Anesthesia Complications:         No immediate complications. Procedure:             Pre-Anesthesia Assessment:                        - Prior to the procedure, a History and Physical was                         performed, and patient medications and allergies were                         reviewed. The patient's tolerance of previous                         anesthesia was also reviewed. The risks and benefits                         of the procedure and the sedation options and risks                         were discussed with the patient. All questions were                         answered, and informed consent was obtained. Prior                         Anticoagulants: The patient has taken no anticoagulant                         or antiplatelet agents. ASA Grade Assessment: II - A                         patient with mild systemic disease. After reviewing                         the risks and benefits, the patient was deemed in                         satisfactory condition to undergo the procedure.                        After obtaining informed consent, the endoscope was                         passed under direct vision. Throughout the procedure,                         the patient's blood pressure,  pulse, and oxygen                         saturations were monitored continuously. The Endoscope                         was introduced through the mouth, and advanced to the                         second part of duodenum. The  upper GI endoscopy was                         accomplished without difficulty. The patient tolerated                         the procedure well. Findings:      The examined esophagus was normal.      Diffuse mild inflammation characterized by erythema was found in the       entire examined stomach. Biopsies were taken with a cold forceps for       histology.      The examined duodenum was normal. Biopsies were taken with a cold       forceps for histology. Impression:            - Normal esophagus.                        - Gastritis. Biopsied.                        - Normal examined duodenum. Biopsied. Recommendation:        - Return patient to hospital ward for ongoing care.                        - Resume previous diet.                        - Continue present medications.                        - Await pathology results.                        - Perform a colonoscopy today. Procedure Code(s):     --- Professional ---                        [phone], Esophagogastroduodenoscopy, flexible,                         transoral; with biopsy, single or multiple Diagnosis Code(s):     --- Professional ---                        R19.7, Diarrhea, unspecified                        K29.70, Gastritis, unspecified, without bleeding CPT copyright 2022 American Medical Association. All rights reserved. The codes documented in this report are preliminary and upon coder review may  be revised to meet current compliance requirements. Midge Minium MD, MD 04/22/2023 12:13:24 PM This report has been signed electronically. Number of  Addenda: 0 Note Initiated On: 04/11/2023 11:51 AM Estimated Blood Loss:  Estimated blood loss: none.      [facility]

## 2023-04-13 ENCOUNTER — Inpatient Hospital Stay: Payer: Medicare Other

## 2023-04-13 ENCOUNTER — Encounter: Payer: Self-pay | Admitting: Gastroenterology

## 2023-04-13 DIAGNOSIS — K529 Noninfective gastroenteritis and colitis, unspecified: Secondary | ICD-10-CM | POA: Diagnosis not present

## 2023-04-13 DIAGNOSIS — J9601 Acute respiratory failure with hypoxia: Secondary | ICD-10-CM

## 2023-04-13 LAB — CBC WITH DIFFERENTIAL/PLATELET
Abs Immature Granulocytes: 1.13 10*3/uL — ABNORMAL HIGH (ref 0.00–0.07)
Basophils Absolute: 0.1 10*3/uL (ref 0.0–0.1)
Basophils Relative: 1 %
Eosinophils Absolute: 0 10*3/uL (ref 0.0–0.5)
Eosinophils Relative: 0 %
HCT: 33 % — ABNORMAL LOW (ref 36.0–46.0)
Hemoglobin: 11.6 g/dL — ABNORMAL LOW (ref 12.0–15.0)
Immature Granulocytes: 6 %
Lymphocytes Relative: 10 %
Lymphs Abs: 1.9 10*3/uL (ref 0.7–4.0)
MCH: 34.3 pg — ABNORMAL HIGH (ref 26.0–34.0)
MCHC: 35.2 g/dL (ref 30.0–36.0)
MCV: 97.6 fL (ref 80.0–100.0)
Monocytes Absolute: 1.3 10*3/uL — ABNORMAL HIGH (ref 0.1–1.0)
Monocytes Relative: 7 %
Neutro Abs: 13.8 10*3/uL — ABNORMAL HIGH (ref 1.7–7.7)
Neutrophils Relative %: 76 %
Platelets: 103 10*3/uL — ABNORMAL LOW (ref 150–400)
RBC: 3.38 MIL/uL — ABNORMAL LOW (ref 3.87–5.11)
RDW: 14.7 % (ref 11.5–15.5)
WBC: 18.2 10*3/uL — ABNORMAL HIGH (ref 4.0–10.5)
nRBC: 0.5 % — ABNORMAL HIGH (ref 0.0–0.2)

## 2023-04-13 LAB — BLOOD GAS, VENOUS
Acid-base deficit: 8.8 mmol/L — ABNORMAL HIGH (ref 0.0–2.0)
Bicarbonate: 15.6 mmol/L — ABNORMAL LOW (ref 20.0–28.0)
O2 Saturation: 79.6 %
Patient temperature: 37
pCO2, Ven: 29 mmHg — ABNORMAL LOW (ref 44–60)
pH, Ven: 7.34 (ref 7.25–7.43)
pO2, Ven: 53 mmHg — ABNORMAL HIGH (ref 32–45)

## 2023-04-13 LAB — TROPONIN I (HIGH SENSITIVITY)
Troponin I (High Sensitivity): 43 ng/L — ABNORMAL HIGH (ref <18)
Troponin I (High Sensitivity): 96 ng/L — ABNORMAL HIGH (ref <18)

## 2023-04-13 LAB — GLUCOSE, CAPILLARY
Glucose-Capillary: 59 mg/dL — ABNORMAL LOW (ref 70–99)
Glucose-Capillary: 74 mg/dL (ref 70–99)
Glucose-Capillary: 147 mg/dL — ABNORMAL HIGH (ref 70–99)
Glucose-Capillary: 132 mg/dL — ABNORMAL HIGH (ref 70–99)
Glucose-Capillary: 106 mg/dL — ABNORMAL HIGH (ref 70–99)

## 2023-04-13 LAB — SURGICAL PATHOLOGY

## 2023-04-13 LAB — BASIC METABOLIC PANEL
Anion gap: 8 (ref 5–15)
BUN: 13 mg/dL (ref 8–23)
CO2: 15 mmol/L — ABNORMAL LOW (ref 22–32)
Calcium: 7.4 mg/dL — ABNORMAL LOW (ref 8.9–10.3)
Chloride: 113 mmol/L — ABNORMAL HIGH (ref 98–111)
Creatinine, Ser: 1.27 mg/dL — ABNORMAL HIGH (ref 0.44–1.00)
GFR, Estimated: 47 mL/min — ABNORMAL LOW (ref >60)
Glucose, Bld: 76 mg/dL (ref 70–99)
Potassium: 3.3 mmol/L — ABNORMAL LOW (ref 3.5–5.1)
Sodium: 136 mmol/L (ref 135–145)

## 2023-04-13 LAB — D-DIMER, QUANTITATIVE: D-Dimer, Quant: 0.36 ug/mL-FEU (ref 0.00–0.50)

## 2023-04-13 LAB — BRAIN NATRIURETIC PEPTIDE: B Natriuretic Peptide: 223.4 pg/mL — ABNORMAL HIGH (ref 0.0–100.0)

## 2023-04-13 LAB — MAGNESIUM: Magnesium: 1.9 mg/dL (ref 1.7–2.4)

## 2023-04-13 MED ORDER — NITROGLYCERIN 0.4 MG SL SUBL
0.4000 mg | SUBLINGUAL_TABLET | SUBLINGUAL | Status: DC | PRN
  Administered 2023-04-13: 0.4 mg via SUBLINGUAL
  Filled 2023-04-13: qty 1

## 2023-04-13 MED ORDER — SODIUM BICARBONATE 8.4 % IV SOLN
25.0000 meq | Freq: Once | INTRAVENOUS | Status: AC
  Administered 2023-04-13: 25 meq via INTRAVENOUS
  Filled 2023-04-13: qty 50

## 2023-04-13 MED ORDER — FUROSEMIDE 10 MG/ML IJ SOLN
20.0000 mg | Freq: Once | INTRAMUSCULAR | Status: AC
  Administered 2023-04-13: 20 mg via INTRAVENOUS
  Filled 2023-04-13: qty 4

## 2023-04-13 NOTE — Assessment & Plan Note (Addendum)
See GI's notes and recommendations. Biopsies from Colonoscopy showed nonspecific colitis, inflammatory vs infectious. Completed antibiotics C diff and GI panel negative Supportive care

## 2023-04-13 NOTE — Progress Notes (Addendum)
[facility]  ROUNDING NOTE   Subjective:   Patient laying in bed Tolerating meals Diarrhea has decreased  Potassium 3.3  Objective:  Vital signs in last 24 hours:  Temp:  [96.7 F (35.9 C)-98.3 F (36.8 C)] 98.3 F (36.8 C) (04/17 0826) Pulse Rate:  [88-109] 109 (04/17 0826) Resp:  [16-24] 20 (04/17 0826) BP: (96-122)/(51-68) 120/64 (04/17 0826) SpO2:  [84 %-96 %] 91 % (04/17 0826) Weight:  [71 kg] 71 kg (04/17 0500)  Weight change: 7.1 kg Filed Weights   04/11/23 0339 04/21/2023 0430 04/13/23 0500  Weight: 67.2 kg 63.9 kg 71 kg    Intake/Output: I/O last 3 completed shifts: In: 1671.3 [I.V.:1171.3; IV Piggyback:500] Out: -    Intake/Output this shift:  No intake/output data recorded.  Physical Exam: General: NAD  Head: Normocephalic, atraumatic. Moist oral mucosal membranes  Eyes: Anicteric  Lungs:  Clear to auscultation, normal effort  Heart: Regular rate and rhythm  Abdomen:  Soft, nontender  Extremities:  No peripheral edema.  Neurologic: Alert and oriented, moving all four extremities  Skin: No lesions  Access: None    Basic Metabolic Panel: Recent Labs  Lab 04/13/2023 0904 03/29/2023 1236 04/10/23 0447 04/11/23 0055 04/11/23 0525 04/11/23 0830 04/11/23 2306 04/08/2023 0358 03/30/2023 0800 04/06/2023 0847 04/13/23 0529  NA 140  --  137  --  136  --   --  137  --   --  136  K 2.7*   < > 2.5*   < > 3.7 3.7 3.0* 2.8* 3.3*  --  3.3*  CL 98  --  103  --  107  --   --  110  --   --  113*  CO2 27  --  23  --  21*  --   --  17*  --   --  15*  GLUCOSE 87  --  67*  --  77  --   --  102*  --   --  76  BUN 31*  --  28*  --  19  --   --  15  --   --  13  CREATININE 2.84*  --  2.49*  --  1.73*  --   --  1.46*  --   --  1.27*  CALCIUM 7.4*  --  6.9*  --  7.0*  --   --  6.9*  --   --  7.4*  MG 1.0*  --  0.9*  --  2.9*  --   --   --   --  1.8 1.9  PHOS 3.5  --  3.4  --  2.3*  --   --   --   --   --   --    < > = values in this interval not displayed.      Liver Function Tests: Recent Labs  Lab 04/04/2023 1415 04/08/23 0523  AST 38 34  ALT 19 15  ALKPHOS 173* 141*  BILITOT 0.8 0.6  PROT 6.2* 4.9*  ALBUMIN 2.3* 1.9*    Recent Labs  Lab 04/19/2023 1415  LIPASE 27    No results for input(s): "AMMONIA" in the last 168 hours.  CBC: Recent Labs  Lab 04/15/2023 1415 04/08/23 0523 04/10/23 0447 04/11/23 0525 04/15/2023 0358 04/13/23 0529  WBC 16.3* 13.4* 12.0* 14.0* 14.5* 18.2*  NEUTROABS 9.6*  --  6.3 9.0* 10.1* 13.8*  HGB 15.6* 12.4 12.2 11.5* 10.8* 11.6*  HCT 47.5* 36.3 36.4 34.2* 31.8* 33.0*  MCV 103.0* 98.9 99.5 99.4 99.7 97.6  PLT 199 148* 114* 116* 94* 103*     Cardiac Enzymes: No results for input(s): "CKTOTAL", "CKMB", "CKMBINDEX", "TROPONINI" in the last 168 hours.  BNP: Invalid input(s): "POCBNP"  CBG: Recent Labs  Lab 04/11/23 2103 04/21/2023 0820 04/15/2023 1138 04/24/2023 1710 04/18/2023 2115  GLUCAP 174* 75 65* 93 86     Microbiology: Results for orders placed or performed during the hospital encounter of 04/21/2023  Gastrointestinal Panel by PCR , Stool     Status: None   Collection Time: 04/08/23 11:40 AM   Specimen: Stool  Result Value Ref Range Status   Campylobacter species NOT DETECTED NOT DETECTED Final   Plesimonas shigelloides NOT DETECTED NOT DETECTED Final   Salmonella species NOT DETECTED NOT DETECTED Final   Yersinia enterocolitica NOT DETECTED NOT DETECTED Final   Vibrio species NOT DETECTED NOT DETECTED Final   Vibrio cholerae NOT DETECTED NOT DETECTED Final   Enteroaggregative E coli (EAEC) NOT DETECTED NOT DETECTED Final   Enteropathogenic E coli (EPEC) NOT DETECTED NOT DETECTED Final   Enterotoxigenic E coli (ETEC) NOT DETECTED NOT DETECTED Final   Shiga like toxin producing E coli (STEC) NOT DETECTED NOT DETECTED Final   Shigella/Enteroinvasive E coli (EIEC) NOT DETECTED NOT DETECTED Final   Cryptosporidium NOT DETECTED NOT DETECTED Final   Cyclospora cayetanensis NOT DETECTED  NOT DETECTED Final   Entamoeba histolytica NOT DETECTED NOT DETECTED Final   Giardia lamblia NOT DETECTED NOT DETECTED Final   Adenovirus F40/41 NOT DETECTED NOT DETECTED Final   Astrovirus NOT DETECTED NOT DETECTED Final   Norovirus GI/GII NOT DETECTED NOT DETECTED Final   Rotavirus A NOT DETECTED NOT DETECTED Final   Sapovirus (I, II, IV, and V) NOT DETECTED NOT DETECTED Final    Comment: Performed at [facility] [place], [address] [place], [place] [address]  C Difficile Quick Screen w PCR reflex     Status: None   Collection Time: 04/08/23 11:40 AM   Specimen: STOOL  Result Value Ref Range Status   C Diff antigen NEGATIVE NEGATIVE Final   C Diff toxin NEGATIVE NEGATIVE Final   C Diff interpretation No C. difficile detected.  Final    Comment: Performed at [facility], [address] [place], [place] [address]    Coagulation Studies: No results for input(s): "LABPROT", "INR" in the last 72 hours.  Urinalysis: No results for input(s): "COLORURINE", "LABSPEC", "PHURINE", "GLUCOSEU", "HGBUR", "BILIRUBINUR", "KETONESUR", "PROTEINUR", "UROBILINOGEN", "NITRITE", "LEUKOCYTESUR" in the last 72 hours.  Invalid input(s): "APPERANCEUR"    Imaging: No results found.   Medications:    sodium chloride 20 mL/hr at 04/10/2023 1204    vitamin C  500 mg Oral BID   carvedilol  6.25 mg Oral BID WC   Chlorhexidine Gluconate Cloth  6 each Topical Daily   cholecalciferol  1,000 Units Oral Daily   enoxaparin (LOVENOX) injection  40 mg Subcutaneous QHS   ezetimibe  10 mg Oral Daily   feeding supplement  1 Container Oral TID BM   folic acid  1 mg Oral Daily   lipase/protease/amylase  72,000 Units Oral TID WC   multivitamin with minerals  1 tablet Oral Daily   pantoprazole  40 mg Oral Daily   QUEtiapine  50 mg Oral BID   sertraline  100 mg Oral Daily   sodium chloride flush  10-40 mL Intracatheter Q12H   spironolactone  12.5 mg Oral Daily   topiramate  50 mg Oral BID  zinc sulfate  220 mg Oral Daily   acetaminophen **OR** acetaminophen, nitroGLYCERIN, ondansetron **OR** ondansetron (ZOFRAN) IV, oxyCODONE-acetaminophen, sodium chloride flush, SUMAtriptan  Assessment/ Plan:  Ms. Emma Gould is a 67 y.o.  female  with past medical history consistent with hypertension, nonobstructive CAD, diastolic heart failure, and chronic kidney disease,, who was admitted to [facility] on [facility]/26/2023 for Colitis [K52.9] Acute colitis [K52.9] AKI (acute kidney injury) [N17.9]   Acute kidney injury on chronic kidney disease stage IIIa. Baseline creatinine appears to be 1.21 with GFR 50 on 02/26/2023. Acute kidney injury secondary to hypovolemia due to persistent diarrhea. No IV contrast exposure. Creatinine 3.17 on ED admission. Creatinine has returned to baseline Patient encouraged to maintain oral intake   Lab Results  Component Value Date   CREATININE 1.27 (H) 04/13/2023   CREATININE 1.46 (H) 03/30/2023   CREATININE 1.73 (H) 04/11/2023    Intake/Output Summary (Last 24 hours) at 04/13/2023 1052 Last data filed at 04/23/2023 1225 Gross per 24 hour  Intake 400 ml  Output --  Net 400 ml    2.  Severe hypokalemia/hypomagnesia.  Likely due to persistent diarrhea.  Potassium corrected with IV and oral supplementation. Magnesium within desired range.   3.  Hypertension with chronic kidney disease.  Home regimen includes carvedilol, ezetimbre, spironolactone, and torsemide.  Receiving all medications. Blood pressure 120/64   4.  Chronic diarrhea, etiology unknown.  C. difficile negative.  GI panel negative.  GI completed EGD and colonoscopy. EGD showed some gastritic while colonoscopy showed multiple polyps, severe colonic inflammation and internal hemorrhoids.     LOS: 6   4/17/202410:52 AM

## 2023-04-13 NOTE — Assessment & Plan Note (Signed)
Severe on admission continue repletion and monitoring secondary to ongoing chronic diarrhea Continue aggressive replacement therapy as needed PICC line in place-this was placed to assist with electrolyte correction and IV fluid need in the setting of severe electrolyte derangements Monitor electrolytes closely

## 2023-04-13 NOTE — Progress Notes (Signed)
Progress Note   Patient: Emma Gould ZOX:[id number] DOB: Apr 04, 1956 DOA: 04/03/2023     6 DOS: the patient was seen and examined on 04/13/2023   Brief hospital course: Emma Gould is a 67 y.o. female with medical history significant for hypertension, , CKD llla, nonobstructive CAD, dCHF, anxiety, with a 39-month history of chronic diarrhea, hospitalized early March 2024 at [facility] with critically low potassium of less than 2 and 3/18-3/22 at [facility] for noninfectious colitis complicated by AKI and hypokalemia, discharged on loperamide and Bentyl and referred to GI, who presents to the ED with a 2-week complaint of abdominal pain, generalized weakness, resulting in frequent falls, most recently on the day of arrival, not resulting in any injury.  She denies fever, chills, dysuria and denies cough, chest pain or shortness of breath.  Patient has persistent nausea and intermittent vomiting to where it is difficult to keep anything on and has associated epigastric discomfort.  Vomiting is nonbloody nonbilious and non-coffee-ground.  His stool is dark, liquid.  She has not seen a gastroenterologist for this problem. In the emergency room CT abdomen and pelvis shows findings that represent mild colitis infectious or inflammatory.   Further hospital course and management as outlined below.  Assessment and Plan: * Chronic diarrhea of unknown origin .  Pancolitis Chronic diarrhea of unknown originPatient presents with nausea vomiting and diarrhea, similar to hospitalization at [facility] from 3/18 to 3/22 GI infectious workup was negative with negative GI panel negative ova and parasites and negative C. difficile CT abdomen and pelvis showed colitis back in March and now shows mild colitis Patient has not yet followed up with GI - GI consulted, see their recs - Stool studies within normal limits - Managed with IV hydration and supportive care -- Stop IV fluids today and monitor for adequate PO hydration -  Continue pancreatic enzymes as recommended by GI Underwent EGD/colonoscopy 04/22/2023. EGD showed gastritis and colonoscopy showed pancolitis biopsies were taken.  Also showed polyp that was removed and nonbleeding internal hemorrhoids.  Increased anion gap metabolic acidosis Due to AKI on CKD IIIa. Improving Monitor BMP off IV fluids  Acute renal failure superimposed on stage 3a chronic kidney disease Increased anion gap metabolic acidosis Secondary to chronic diarrhea - Stop IV fluids and monitor - Monitor and replace electrolytes - Diuretics with discontinued at last discharge from [facility] 3/22 -Symptomatic care with antidiarrheals  Protein calorie malnutrition .  Generalized weakness Protein calorie malnutrition, suspected Frequent falls Secondary to ongoing chronic diarrhea, poor oral intake - Dietary consult - PT evaluation   Hypomagnesemia Severe on admission, replaced Monitor Mg level & replace PRN  Polyp of ascending colon Per GI  AKI (acute kidney injury) CKD stage IIIa AKI likely pre-renal due to diarrhea / hypovolemia.  Renal function improving. --Stop IV fluids and monitor BMP --Encourage PO hydration  Chronic diastolic CHF (congestive heart failure) Echo 12/10/2022 EF 60-65% and RHC revealed diastolic dysfunction  Monitor for fluid overload in view of IV hydration  CAD (coronary artery disease) History of nonobstructive CAD Continue carvedilol and ezetimibe  Hypokalemia Severe on admission continue repletion and monitoring secondary to ongoing chronic diarrhea Continue aggressive replacement therapy as needed PICC line in place-this was placed to assist with electrolyte correction and IV fluid need in the setting of severe electrolyte derangements Monitor electrolytes closely  Migraine Continue Topamax and sumatriptan  Depression Continue sertraline        Subjective: Pt seen awake resting in bed this AM.  Reports feeling better  but still  not able to eat much.  Reports previous diarrhea, now constipated with no BM since Monday [facility] flowsheet has documented small type 6 BM earlier this AM).  No other acute complaints.    She denies having any chest pain.  Physical Exam: Vitals:   04/13/23 0541 04/13/23 0554 04/13/23 0826 04/13/23 1158  BP: 122/64 122/64 120/64 114/63  Pulse: (!) 103 100 (!) 109 96  Resp:  (!) Temp: (!) 97.5 F (36.4 C) (!) 97.5 F (36.4 C) 98.3 F (36.8 C) (!) 96.7 F (35.9 C)  TempSrc:   Oral   SpO2: (!) 84% (!) 87% 91% 91%  Weight:      Height:       General exam: awake, alert, no acute distress HEENT: atraumatic, clear conjunctiva, anicteric sclera, moist mucus membranes, hearing grossly normal  Respiratory system: CTAB, no wheezes, rales or rhonchi, normal respiratory effort. Cardiovascular system: normal S1/S2, RRR, no JVD, murmurs, rubs, gallops,  no pedal edema.   Gastrointestinal system: soft, NT, ND, no HSM felt, +bowel sounds. Central nervous system: A&O x3. no gross focal neurologic deficits, normal speech Extremities: moves all , no edema, normal tone Skin: dry, intact, normal temperature, normal color, No rashes, lesions or ulcers Psychiatry: normal mood, congruent affect, judgement and insight appear normal   Data Reviewed:  Notable labs --- K 3.3, Cl 113, bicarb 15, Cr 1.27 improved, Ca 7.4, GFR 47, trop 43 >> 96   CBC with WBC 18.2, Hbg 11.6, platelets 103k with neutro and monocytes predominant differential  Family Communication: None present, will attempt to call as time allows  Disposition: Status is: Inpatient Remains inpatient appropriate because: Inadequate PO intake, ongoing close monitoring for stability of electrolytes and renal function.   Planned Discharge Destination: Home    Time spent: 42 minutes  Author: Pennie Banter, DO 04/13/2023 3:41 PM  For on call review [email or web address].

## 2023-04-13 NOTE — Assessment & Plan Note (Signed)
Per GI

## 2023-04-13 NOTE — Anesthesia Postprocedure Evaluation (Incomplete)
Anesthesia Post Note  Patient: Emma Gould  Procedure(s) Performed: ESOPHAGOGASTRODUODENOSCOPY (EGD) WITH PROPOFOL COLONOSCOPY  Anesthesia Type: General Anesthetic complications: no   No notable events documented.   Last Vitals:  Vitals:   04/13/23 1939 04/13/23 2135  BP: 122/63 (!) 126/56  Pulse: 88 95  Resp: (!) 38 (!) 36  Temp: 36.5 C 36.8 C  SpO2: 90% 91%    Last Pain:  Vitals:   04/13/23 2135  TempSrc: Oral  PainSc:                  Lenard Simmer

## 2023-04-13 NOTE — TOC Progression Note (Addendum)
Transition of Care [facility]) - Progression Note    Patient Details  Name: Emma Gould MRN: [id number] Date of Birth: 02-14-1956  Transition of Care [facility]) CM/SW Contact  Chapman Fitch, RN Phone Number: 04/13/2023, 2:25 PM  Clinical Narrative:     Admitted for: diarrhea unknown origin  Admitted from: PCP: Randel Pigg  Current home health/prior home health/DME:([facility] ([facility] wheels);[facility]in[facility]) Iantha Fallen home health PT and OT    Update.  Per Amy with [facility] patient only open with PT services     Expected Discharge Plan and Services                                               Social Determinants of Health (SDOH) Interventions SDOH Screenings   Food Insecurity: No Food Insecurity (03/30/2023)  Housing: Low Risk  (04/03/2023)  Transportation Needs: No Transportation Needs (04/18/2023)  Utilities: Not At Risk (04/13/2023)  Tobacco Use: High Risk (04/13/2023)    Readmission Risk Interventions     No data to display

## 2023-04-13 NOTE — Assessment & Plan Note (Signed)
Severe on admission, replaced Monitor Mg level & replace PRN

## 2023-04-13 NOTE — Hospital Course (Signed)
Emma Gould is a 68 y.o. female with medical history significant for hypertension, , CKD llla, nonobstructive CAD, dCHF, anxiety, with a 5-month history of chronic diarrhea, hospitalized early March 2024 at [facility] with critically low potassium of less than 2 and 3/18-3/22 at [facility] for noninfectious colitis complicated by AKI and hypokalemia, discharged on loperamide and Bentyl and referred to GI, who presents to the ED with a 2-week complaint of abdominal pain, generalized weakness, resulting in frequent falls, most recently on the day of arrival, not resulting in any injury.  She denies fever, chills, dysuria and denies cough, chest pain or shortness of breath.  Patient has persistent nausea and intermittent vomiting to where it is difficult to keep anything on and has associated epigastric discomfort.  Vomiting is nonbloody nonbilious and non-coffee-ground.  His stool is dark, liquid.  She has not seen a gastroenterologist for this problem. In the emergency room CT abdomen and pelvis shows findings that represent mild colitis infectious or inflammatory.   Further hospital course and management as outlined below.

## 2023-04-13 NOTE — Progress Notes (Signed)
       CROSS COVER NOTE  NAME: Emma Gould MRN: [id number] DOB : 1956/03/17 ATTENDING PHYSICIAN: Loyce Dys, MD    Date of Service   04/13/2023   HPI/Events of Note   Report/Request "Pt in Rm 205a admit for colitis, post colonoscopy has complained that she feels like she is having a heart attach twice tonight, Pt noted that the pain is under the left breast laterally 10/10. VSS except for O2 which was at 84, Pt did have O2 on but had taken it off. With O2 on she SATs in the low 90's. Pt claims that the [place] is smothering her. Pt has been going on with one thing or the next, nausea, having Bms in the bed, and just acting strange. Pt did get Percocet and Zofran earlier. I am not sure what the deal is if it is anxiety which she has nothing ordered for or if we need to look further into this situation "  On Review of chart  At bedside patient is anxious appearing. She reports abdominal pain and discomfort and talks about prolonged bowel prep and not being able to eat. At this time she denies chest pain and points to LUQ when asked where her pain was earlier tonight. This is ongoing she underwent EGD/colonoscopy this admission that showed gastritis.   Interventions   Assessment/Plan:  EKG Troponin - 43--> PRN Nitro Continue Protonix Continue PRN Percocet Continue Seroquel and Zoloft for anxiety       To reach the provider On-Call:   7AM- 7PM see care teams to locate the attending and reach out to them via [email or web address] Password: [email or web address] 7PM-7AM contact night-coverage If you still have difficulty reaching the appropriate provider, please page the [facility] (Director on Call) for [facility] on amion for assistance  This document was prepared using Conservation officer, historic buildings and may include unintentional dictation errors.  Bishop Limbo DNP, MBA, FNP-BC, PMHNP-BC Nurse Practitioner [facility] Pager [phone]

## 2023-04-13 NOTE — Progress Notes (Signed)
Name: Emma Gould MRN: [id number] DOB: [birth year] ATTENDING PHYSICIAN: Pennie Banter, DO     Subjective: Report received from nursing staff that M(r)s Tonner had a desaturation event with associated confusion. Per nursing SPO2 was in the 70s on nasal cannula and required non-rebreather. She is now on 10L HFNC. VBG last night did not show hypercapnia.   Objective:   Emma Gould is a 67 y.o. female with medical history significant for hypertension, , CKD llla, nonobstructive CAD, dCHF, anxiety, with a 39-month history of chronic diarrhea. She presented to [facility] ED with a 2-week complaint of abdominal pain, generalized weakness, resulting in frequent falls. She was found to have pancolitis, AKI on CKD-3A, and electrolyte abnormalities.    Today's Vitals   04/14/23 0143 04/14/23 0507 04/14/23 0547 04/14/23 0549  BP: (!) 109/51 (!) 125/90    Pulse: 95 (!) 101    Resp: (!) 29 (!) 32    Temp: 98.2 F (36.8 C) 98 F (36.7 C)    TempSrc:      SpO2: 91% 98% 100% 92%  Weight:      Height:      PainSc:       Body mass index is 27.73 kg/m.   CBC    Component Value Date/Time   WBC 16.6 (H) 04/14/2023 0500   RBC 3.20 (L) 04/14/2023 0500   HGB 10.8 (L) 04/14/2023 0500   HCT 31.0 (L) 04/14/2023 0500   PLT 96 (L) 04/14/2023 0500   MCV 96.9 04/14/2023 0500   MCH 33.8 04/14/2023 0500   MCHC 34.8 04/14/2023 0500   RDW 15.1 04/14/2023 0500   LYMPHSABS 1.9 04/13/2023 0529   MONOABS 1.3 (H) 04/13/2023 0529   EOSABS 0.0 04/13/2023 0529   BASOSABS 0.1 04/13/2023 0529      Latest Ref Rng & Units 04/14/2023    5:00 AM 04/13/2023    5:29 AM 04/15/2023    8:00 AM  BMP  Glucose 70 - 99 mg/dL 88  76    BUN 8 - 23 mg/dL 15  13    Creatinine 4.09 - 1.00 mg/dL 8.11  9.14    Sodium 782 - 145 mmol/L 139  136    Potassium 3.5 - 5.1 mmol/L 3.4  3.3  3.3   Chloride 98 - 111 mmol/L 113  113    CO2 22 - 32 mmol/L 17  15    Calcium 8.9 - 10.3 mg/dL 7.9  7.4      Venous Blood Gas result:   pH 7.36; pCO2 29; pO2 38;  HCO3 16.4, %O2 Sat 53.7.    DG Chest 1 View  Result Date: 04/13/2023 CLINICAL DATA:  Hypoxia. EXAM: CHEST  1 VIEW COMPARISON:  Chest radiograph dated 11/22/2022. FINDINGS: Faint diffuse interstitial and hazy airspace density primarily involving the mid to lower lung field may represent combination of atelectasis and edema. Pneumonia is not excluded. Clinical correlation is recommended. A small left pleural effusion suspected. No pneumothorax. The cardiac silhouette is within normal limits. Atherosclerotic calcification of the aortic arch. No acute osseous pathology. Cervical spine ACDF. IMPRESSION: Probable interstitial edema versus developing infiltrate. Electronically Signed   By: Elgie Collard M.D.   On: 04/13/2023 20:11    Physical Exam:   General: Adult female, anxious appearing, lying in bed in no acute distress, NRB in place.  HEENT: MM dry, anicteric, atraumatic,  Neuro: A&O follows commands, PERRL  CV: s1s2, RRR, no r/m/g Pulm: Regular, non labored on NRB , breath sounds  clear-BUL & clear-BLL GI: soft, non-distended, non-tender, bs active x 4 GU: deferred Skin: Pale, no rashes/lesions noted Extremities: warm/dry, pulses + 2 Radial and Pedal, no edema noted  Assessment/Plan:  #Hypoxia in setting of Pulmonary Edema, BNP 233 - Supplemental O2 - 20 mg IV Lasix, follow Creatinine with AM BMP (~1300 mL UOP overnight per nursing)  #Non Anion Gap Metabolic Acidosis - Bicarbonate push  #Relative Hypoglycemia, CBG 75 - 12.5g D50 x1

## 2023-04-13 NOTE — Assessment & Plan Note (Signed)
POA Due to AKI on CKD IIIa. Monitor BMP off IV fluids Now with normal anion gap metabolic acidosis with improving renal function. Topamax can contribute to NAGMA, will discuss with patient and family re holding Topamax. --Treat underlying issues as outlined --Monitor BMP

## 2023-04-13 NOTE — Anesthesia Postprocedure Evaluation (Signed)
Anesthesia Post Note  Patient: Emma Gould  Procedure(s) Performed: ESOPHAGOGASTRODUODENOSCOPY (EGD) WITH PROPOFOL COLONOSCOPY  Patient location during evaluation: Endoscopy Anesthesia Type: General Level of consciousness: awake and alert Pain management: pain level controlled Vital Signs Assessment: post-procedure vital signs reviewed and stable Respiratory status: spontaneous breathing, nonlabored ventilation, respiratory function stable and patient connected to nasal cannula oxygen Cardiovascular status: blood pressure returned to baseline and stable Postop Assessment: no apparent nausea or vomiting Anesthetic complications: no   No notable events documented.   Last Vitals:  Vitals:   04/13/23 1939 04/13/23 2135  BP: 122/63 (!) 126/56  Pulse: 88 95  Resp: (!) 38 (!) 36  Temp: 36.5 C 36.8 C  SpO2: 90% 91%    Last Pain:  Vitals:   04/13/23 2135  TempSrc: Oral  PainSc:                  Lenard Simmer

## 2023-04-13 NOTE — Assessment & Plan Note (Signed)
CKD stage IIIa AKI likely pre-renal due to diarrhea / hypovolemia.  Renal function improving. --Stop IV fluids and monitor BMP --Encourage PO hydration

## 2023-04-14 ENCOUNTER — Inpatient Hospital Stay: Payer: Medicare Other

## 2023-04-14 DIAGNOSIS — K529 Noninfective gastroenteritis and colitis, unspecified: Secondary | ICD-10-CM | POA: Diagnosis not present

## 2023-04-14 DIAGNOSIS — G9341 Metabolic encephalopathy: Secondary | ICD-10-CM | POA: Diagnosis present

## 2023-04-14 DIAGNOSIS — J9601 Acute respiratory failure with hypoxia: Secondary | ICD-10-CM | POA: Diagnosis not present

## 2023-04-14 DIAGNOSIS — D696 Thrombocytopenia, unspecified: Secondary | ICD-10-CM | POA: Insufficient documentation

## 2023-04-14 DIAGNOSIS — E8809 Other disorders of plasma-protein metabolism, not elsewhere classified: Secondary | ICD-10-CM | POA: Diagnosis present

## 2023-04-14 LAB — COMPREHENSIVE METABOLIC PANEL
ALT: 27 U/L (ref 0–44)
AST: 48 U/L — ABNORMAL HIGH (ref 15–41)
Albumin: <1.5 g/dL — ABNORMAL LOW (ref 3.5–5.0)
Alkaline Phosphatase: 564 U/L — ABNORMAL HIGH (ref 38–126)
Anion gap: 9 (ref 5–15)
BUN: 15 mg/dL (ref 8–23)
CO2: 17 mmol/L — ABNORMAL LOW (ref 22–32)
Calcium: 7.9 mg/dL — ABNORMAL LOW (ref 8.9–10.3)
Chloride: 113 mmol/L — ABNORMAL HIGH (ref 98–111)
Creatinine, Ser: 1.39 mg/dL — ABNORMAL HIGH (ref 0.44–1.00)
GFR, Estimated: 42 mL/min — ABNORMAL LOW (ref >60)
Glucose, Bld: 88 mg/dL (ref 70–99)
Potassium: 3.4 mmol/L — ABNORMAL LOW (ref 3.5–5.1)
Sodium: 139 mmol/L (ref 135–145)
Total Bilirubin: 0.7 mg/dL (ref 0.3–1.2)
Total Protein: 4.2 g/dL — ABNORMAL LOW (ref 6.5–8.1)

## 2023-04-14 LAB — BLOOD GAS, VENOUS
Acid-base deficit: 7.7 mmol/L — ABNORMAL HIGH (ref 0.0–2.0)
Bicarbonate: 16.4 mmol/L — ABNORMAL LOW (ref 20.0–28.0)
O2 Saturation: 53.7 %
Patient temperature: 37
pCO2, Ven: 29 mmHg — ABNORMAL LOW (ref 44–60)
pH, Ven: 7.36 (ref 7.25–7.43)
pO2, Ven: 38 mmHg (ref 32–45)

## 2023-04-14 LAB — CBC
HCT: 31 % — ABNORMAL LOW (ref 36.0–46.0)
Hemoglobin: 10.8 g/dL — ABNORMAL LOW (ref 12.0–15.0)
MCH: 33.8 pg (ref 26.0–34.0)
MCHC: 34.8 g/dL (ref 30.0–36.0)
MCV: 96.9 fL (ref 80.0–100.0)
Platelets: 96 10*3/uL — ABNORMAL LOW (ref 150–400)
RBC: 3.2 MIL/uL — ABNORMAL LOW (ref 3.87–5.11)
RDW: 15.1 % (ref 11.5–15.5)
WBC: 16.6 10*3/uL — ABNORMAL HIGH (ref 4.0–10.5)
nRBC: 1.3 % — ABNORMAL HIGH (ref 0.0–0.2)

## 2023-04-14 LAB — GLUCOSE, CAPILLARY
Glucose-Capillary: 75 mg/dL (ref 70–99)
Glucose-Capillary: 83 mg/dL (ref 70–99)
Glucose-Capillary: 131 mg/dL — ABNORMAL HIGH (ref 70–99)
Glucose-Capillary: 99 mg/dL (ref 70–99)
Glucose-Capillary: 150 mg/dL — ABNORMAL HIGH (ref 70–99)
Glucose-Capillary: 121 mg/dL — ABNORMAL HIGH (ref 70–99)

## 2023-04-14 LAB — AMMONIA: Ammonia: 55 umol/L — ABNORMAL HIGH (ref 9–35)

## 2023-04-14 LAB — PHOSPHORUS: Phosphorus: 2.8 mg/dL (ref 2.5–4.6)

## 2023-04-14 LAB — PROCALCITONIN
Procalcitonin: 1.68 ng/mL
Procalcitonin: 1.72 ng/mL

## 2023-04-14 LAB — BLOOD GAS, ARTERIAL
Acid-base deficit: 10 mmol/L — ABNORMAL HIGH (ref 0.0–2.0)
Bicarbonate: 13.8 mmol/L — ABNORMAL LOW (ref 20.0–28.0)
FIO2: 97 %
O2 Content: 60 L/min
O2 Saturation: 94 %
Patient temperature: 37
pCO2 arterial: 25 mmHg — ABNORMAL LOW (ref 32–48)
pH, Arterial: 7.35 (ref 7.35–7.45)
pO2, Arterial: 70 mmHg — ABNORMAL LOW (ref 83–108)

## 2023-04-14 LAB — BASIC METABOLIC PANEL
Anion gap: 10 (ref 5–15)
BUN: 14 mg/dL (ref 8–23)
CO2: 14 mmol/L — ABNORMAL LOW (ref 22–32)
Calcium: 7.7 mg/dL — ABNORMAL LOW (ref 8.9–10.3)
Chloride: 113 mmol/L — ABNORMAL HIGH (ref 98–111)
Creatinine, Ser: 1.48 mg/dL — ABNORMAL HIGH (ref 0.44–1.00)
GFR, Estimated: 39 mL/min — ABNORMAL LOW (ref >60)
Glucose, Bld: 154 mg/dL — ABNORMAL HIGH (ref 70–99)
Potassium: 3.4 mmol/L — ABNORMAL LOW (ref 3.5–5.1)
Sodium: 137 mmol/L (ref 135–145)

## 2023-04-14 LAB — TROPONIN I (HIGH SENSITIVITY): Troponin I (High Sensitivity): 85 ng/L — ABNORMAL HIGH (ref <18)

## 2023-04-14 LAB — MAGNESIUM: Magnesium: 1.8 mg/dL (ref 1.7–2.4)

## 2023-04-14 LAB — LACTIC ACID, PLASMA
Lactic Acid, Venous: 3.1 mmol/L (ref 0.5–1.9)
Lactic Acid, Venous: 1.8 mmol/L (ref 0.5–1.9)

## 2023-04-14 LAB — BRAIN NATRIURETIC PEPTIDE: B Natriuretic Peptide: 208.1 pg/mL — ABNORMAL HIGH (ref 0.0–100.0)

## 2023-04-14 LAB — MRSA NEXT GEN BY PCR, NASAL: MRSA by PCR Next Gen: NOT DETECTED

## 2023-04-14 MED ORDER — SALINE SPRAY 0.65 % NA SOLN
1.0000 | NASAL | Status: DC | PRN
  Filled 2023-04-14: qty 44

## 2023-04-14 MED ORDER — FUROSEMIDE 10 MG/ML IJ SOLN
20.0000 mg | Freq: Once | INTRAMUSCULAR | Status: AC
  Administered 2023-04-14: 20 mg via INTRAVENOUS
  Filled 2023-04-14: qty 2

## 2023-04-14 MED ORDER — BUDESONIDE 0.25 MG/2ML IN SUSP
0.2500 mg | Freq: Once | RESPIRATORY_TRACT | Status: AC
  Administered 2023-04-14: 0.25 mg via RESPIRATORY_TRACT
  Filled 2023-04-14: qty 2

## 2023-04-14 MED ORDER — DEXTROSE 50 % IV SOLN
12.5000 g | Freq: Once | INTRAVENOUS | Status: AC
  Administered 2023-04-14: 12.5 g via INTRAVENOUS
  Filled 2023-04-14: qty 50

## 2023-04-14 MED ORDER — VANCOMYCIN HCL 1500 MG/300ML IV SOLN
1500.0000 mg | INTRAVENOUS | Status: DC
  Administered 2023-04-14: 1500 mg via INTRAVENOUS
  Filled 2023-04-14: qty 300

## 2023-04-14 MED ORDER — IPRATROPIUM-ALBUTEROL 0.5-2.5 (3) MG/3ML IN SOLN
3.0000 mL | Freq: Once | RESPIRATORY_TRACT | Status: AC
  Administered 2023-04-14: 3 mL via RESPIRATORY_TRACT
  Filled 2023-04-14: qty 3

## 2023-04-14 MED ORDER — SODIUM CHLORIDE 0.9 % IV SOLN
2.0000 g | Freq: Two times a day (BID) | INTRAVENOUS | Status: DC
  Administered 2023-04-14: 2 g via INTRAVENOUS
  Filled 2023-04-14: qty 2
  Filled 2023-04-14: qty 12.5

## 2023-04-14 MED ORDER — POTASSIUM CHLORIDE CRYS ER 20 MEQ PO TBCR
40.0000 meq | EXTENDED_RELEASE_TABLET | Freq: Once | ORAL | Status: AC
  Administered 2023-04-14: 40 meq via ORAL
  Filled 2023-04-14 (×2): qty 2

## 2023-04-14 MED ORDER — PROCHLORPERAZINE EDISYLATE 10 MG/2ML IJ SOLN
10.0000 mg | Freq: Four times a day (QID) | INTRAMUSCULAR | Status: DC | PRN
  Administered 2023-04-14: 10 mg via INTRAVENOUS
  Filled 2023-04-14: qty 2

## 2023-04-14 MED ORDER — ALBUMIN HUMAN 25 % IV SOLN
12.5000 g | Freq: Once | INTRAVENOUS | Status: AC
  Administered 2023-04-14: 12.5 g via INTRAVENOUS
  Filled 2023-04-14 (×2): qty 50

## 2023-04-14 MED ORDER — IPRATROPIUM-ALBUTEROL 0.5-2.5 (3) MG/3ML IN SOLN: 3.0000 mL | Freq: Four times a day (QID) | RESPIRATORY_TRACT | Status: DC | PRN

## 2023-04-14 MED ORDER — SODIUM BICARBONATE 8.4 % IV SOLN
50.0000 meq | Freq: Once | INTRAVENOUS | Status: AC
  Administered 2023-04-14: 50 meq via INTRAVENOUS
  Filled 2023-04-14: qty 50

## 2023-04-14 MED ORDER — FUROSEMIDE 10 MG/ML IJ SOLN
20.0000 mg | Freq: Once | INTRAMUSCULAR | Status: AC
  Administered 2023-04-14: 20 mg via INTRAVENOUS
  Filled 2023-04-14: qty 4

## 2023-04-14 NOTE — Assessment & Plan Note (Signed)
Suspect acute on chronic, negative acute phase reactant.  Albumin is < 1.5.  Pt with progressive peripheral edema due to third spacing, also hypoxic. --May need IV albumin to augment diuresis --Optimize nutrition

## 2023-04-14 NOTE — Progress Notes (Signed)
Emma Minium, MD [facility]   [address], [address] [place], [place] [address] Phone: [phone] Fax : [phone]   Subjective: The patient is in bed in no apparent distress.  She has not had any further diarrhea.  The patient's biopsies were consistent with inflammation or resolving infection.  The patient has no complaints today.  She is tolerating p.o.'s.   Objective: Vital signs in last 24 hours: Vitals:   04/14/23 1635 04/14/23 1640 04/14/23 1700 04/14/23 1800  BP: 122/82  (!) 118/41 119/76  Pulse: (!) 103 100 94 92  Resp: (!) 43 (!) 39 (!) 21 (!) 31  Temp: 97.8 F (36.6 C)     TempSrc: Axillary     SpO2: (!) 81% 94% 96% 96%  Weight:      Height:  (1.6 m)      Weight change: -1 kg  Intake/Output Summary (Last 24 hours) at 04/14/2023 2010 Last data filed at 04/14/2023 1415 Gross per 24 hour  Intake 360 ml  Output --  Net 360 ml     Exam: Heart:: Regular rate and rhythm, S1S2 present, or without murmur or extra heart sounds Lungs: normal and clear to auscultation and percussion Abdomen: soft, nontender, normal bowel sounds   Lab Results: @ Micro Results: Recent Results (from the past 240 hour(s))  Gastrointestinal Panel by PCR , Stool     Status: None   Collection Time: 04/08/23 11:40 AM   Specimen: Stool  Result Value Ref Range Status   Campylobacter species NOT DETECTED NOT DETECTED Final   Plesimonas shigelloides NOT DETECTED NOT DETECTED Final   Salmonella species NOT DETECTED NOT DETECTED Final   Yersinia enterocolitica NOT DETECTED NOT DETECTED Final   Vibrio species NOT DETECTED NOT DETECTED Final   Vibrio cholerae NOT DETECTED NOT DETECTED Final   Enteroaggregative E coli (EAEC) NOT DETECTED NOT DETECTED Final   Enteropathogenic E coli (EPEC) NOT DETECTED NOT DETECTED Final   Enterotoxigenic E coli (ETEC) NOT DETECTED NOT DETECTED Final   Shiga like toxin producing E coli (STEC) NOT DETECTED NOT DETECTED Final    Shigella/Enteroinvasive E coli (EIEC) NOT DETECTED NOT DETECTED Final   Cryptosporidium NOT DETECTED NOT DETECTED Final   Cyclospora cayetanensis NOT DETECTED NOT DETECTED Final   Entamoeba histolytica NOT DETECTED NOT DETECTED Final   Giardia lamblia NOT DETECTED NOT DETECTED Final   Adenovirus F40/41 NOT DETECTED NOT DETECTED Final   Astrovirus NOT DETECTED NOT DETECTED Final   Norovirus GI/GII NOT DETECTED NOT DETECTED Final   Rotavirus A NOT DETECTED NOT DETECTED Final   Sapovirus (I, II, IV, and V) NOT DETECTED NOT DETECTED Final    Comment: Performed at [facility], [address] [place], [place] [address]  C Difficile Quick Screen w PCR reflex     Status: None   Collection Time: 04/08/23 11:40 AM   Specimen: STOOL  Result Value Ref Range Status   C Diff antigen NEGATIVE NEGATIVE Final   C Diff toxin NEGATIVE NEGATIVE Final   C Diff interpretation No C. difficile detected.  Final    Comment: Performed at [facility], [address] [place], [place] [address]  MRSA Next Gen by PCR, Nasal     Status: None   Collection Time: 04/14/23  4:39 PM   Specimen: Nasal Mucosa; Nasal Swab  Result Value Ref Range Status   MRSA by PCR Next Gen NOT DETECTED NOT DETECTED Final    Comment: (NOTE) The GeneXpert MRSA Assay (FDA  approved for NASAL specimens only), is one component of a comprehensive MRSA colonization surveillance program. It is not intended to diagnose MRSA infection nor to guide or monitor treatment for MRSA infections. Test performance is not FDA approved in patients less than 72 years old. Performed at [facility], [address] [place], [place] [address]    Studies/Results: [facility] Chest Port 1 View  Result Date: 04/14/2023 CLINICAL DATA:  Respiratory distress EXAM: PORTABLE CHEST 1 VIEW COMPARISON:  04/13/2023 FINDINGS: Diffuse alveolar process identified consistent with extensive pneumonia or pulmonary edema. No pneumothorax identified. No  definite pleural effusion is seen. Right-sided PICC tip at the RA SVC junction. Calcified aorta. IMPRESSION: Extensive diffuse alveolar opacity with interval worsening. Electronically Signed   By: Layla Maw M.D.   On: 04/14/2023 16:57   DG Chest 1 View  Result Date: 04/13/2023 CLINICAL DATA:  Hypoxia. EXAM: CHEST  1 VIEW COMPARISON:  Chest radiograph dated 11/22/2022. FINDINGS: Faint diffuse interstitial and hazy airspace density primarily involving the mid to lower lung field may represent combination of atelectasis and edema. Pneumonia is not excluded. Clinical correlation is recommended. A small left pleural effusion suspected. No pneumothorax. The cardiac silhouette is within normal limits. Atherosclerotic calcification of the aortic arch. No acute osseous pathology. Cervical spine ACDF. IMPRESSION: Probable interstitial edema versus developing infiltrate. Electronically Signed   By: Elgie Collard M.D.   On: 04/13/2023 20:11   Medications: I have reviewed the patient's current medications. Scheduled Meds:  vitamin C  500 mg Oral BID   carvedilol  6.25 mg Oral BID WC   Chlorhexidine Gluconate Cloth  6 each Topical Daily   cholecalciferol  1,000 Units Oral Daily   ezetimibe  10 mg Oral Daily   feeding supplement  1 Container Oral TID BM   folic acid  1 mg Oral Daily   lipase/protease/amylase  72,000 Units Oral TID WC   multivitamin with minerals  1 tablet Oral Daily   pantoprazole  40 mg Oral Daily   QUEtiapine  50 mg Oral BID   sertraline  100 mg Oral Daily   sodium chloride flush  10-40 mL Intracatheter Q12H   spironolactone  12.5 mg Oral Daily   topiramate  50 mg Oral BID   zinc sulfate  220 mg Oral Daily   Continuous Infusions:  sodium chloride 10 mL/hr at 04/14/23 1709   ceFEPime (MAXIPIME) IV     vancomycin     PRN Meds:.acetaminophen **OR** acetaminophen, ipratropium-albuterol, nitroGLYCERIN, ondansetron **OR** ondansetron (ZOFRAN) IV, oxyCODONE-acetaminophen,  prochlorperazine, sodium chloride, sodium chloride flush, SUMAtriptan   Assessment: Principal Problem:   Chronic diarrhea of unknown origin Active Problems:   Depression   Migraine   Hypokalemia   Acute renal failure superimposed on stage 3a chronic kidney disease   Generalized weakness   CAD (coronary artery disease)   Chronic diastolic CHF (congestive heart failure)   Increased anion gap metabolic acidosis   Pancolitis   Protein calorie malnutrition   Frequent falls   AKI (acute kidney injury)   Polyp of ascending colon   Hypomagnesemia   Hypophosphatemia   Acute respiratory failure with hypoxia   Acute metabolic encephalopathy   Hypoalbuminemia   Thrombocytopenia    Plan: This patient had profuse diarrhea with electrolyte abnormalities but the diarrhea has resolved and the colonoscopy did not show any sign of a definite cause for the diarrhea but did show inflammation that could be consistent with resolving infection versus early inflammatory bowel disease.  With the patient's  symptoms improving so greatly I would think that this was a resolving infection.  Nothing further to do from a GI point of view.  I will sign off.  Please call if any further GI concerns or questions.  We would like to thank you for the opportunity to participate in the care of Emmely Bittinger.    LOS: 7 days   Sherlyn Hay 04/14/2023, 8:10 PM Pager [phone] 7am-5pm  Check AMION for 5pm -7am coverage and on weekends

## 2023-04-14 NOTE — Plan of Care (Signed)

## 2023-04-14 NOTE — Progress Notes (Addendum)
Rapid Response Event Note   Reason for Call : respiratory distress, increased O2 needs   Initial Focused Assessment: On my arrival pt is resting in bed. Pt does not seem to be in any distress. RT at bedside and has placed pt on 100% nonrebreather. O2 sats 91%. Pt states that she is not having any trouble breathing and wants to take the mask off. She wakes easily but has her eyes closed. All other vitals stable. Lung sounds are clear. Primary RN states pt received dose of lasix but had not put out much urine.    Interventions: Dr. [place]enton Lank notified of rapid called. Chest xray and ABG ordered per rapid response response protocol. Bladder scan performed and pt had 0ml in bladder. Orders received to transfer pt to stepdown unit.   Plan of Care: Pt transferred to ICU 5. Chest xray and ABG performed when pt arrived to unit. Chest CT ordered per MD but pt unable to go down at this time d/t increased RR and unable to lay flat. Pt receiving an additional dose of lasix and albumin. Pt placed on heated high flow [place].   Event Summary:   MD Notified: Dr. Denton Lank Call Time: 1610 Arrival Time: 1612 End Time: Pt transferred to ICU 5  Henrene Dodge, RN

## 2023-04-14 NOTE — Assessment & Plan Note (Signed)
Resolved.  Monitor and replace phos PRN

## 2023-04-14 NOTE — Assessment & Plan Note (Signed)
Platelets have down-trended, 92k today --Hold Lovenox --SCD's for DVT ppx --Monitor CBC

## 2023-04-14 NOTE — Progress Notes (Signed)
RT called for pt desat into 70's. Requested pt be placed on NRB mask until I get up stairs to assess. Upon arrival pt appeared dusky with nail bed blue on NRB mask attached to cool mist HFNC. NRB removed from HFNC and plaed onto flowmeter. RN called RRT. Pt sat slowly returned to low 90's while remaining dusky and slightly confused. Pt transferred to ICU and placed on HHFNC. Receiving RT aware.

## 2023-04-14 NOTE — Care Management Important Message (Signed)
Important Message  Patient Details  Name: Emma Gould MRN: [id number] Date of Birth: 09/20/1956   Medicare Important Message Given:  Yes     Johnell Comings 04/14/2023, 1:00 PM

## 2023-04-14 NOTE — Progress Notes (Signed)
       CROSS COVER NOTE  NAME: Emma Gould MRN: [id number] DOB : 1956/09/26    HPI/Events of Note   Report:patient un able to tolerate laying flat for CT. Nurse reports patient had significant decrease in oxygen saturation and increased heart rate with severe panick when trying to lay flat  On review of chart: Patient currently admitted with diarrhea and abdominal pain who developed acute respiratory failure with hypoxia likely from fluid overload. She developed worsening hypoxia today, ws started on on HHF 60%   Assessment and  Interventions   Assessment: Patient tells me she feels like she can't breathe llying flat. Discussed possible use of BIPAP that offers pressure as well as oxygen. Patient refused to try Plan: Continue Heated high flow. Try again in am for CT when more stable High risk of intubation        Donnie Mesa NP [facility]

## 2023-04-14 NOTE — Progress Notes (Signed)
   04/14/23 1600  Spiritual Encounters  Type of Visit Initial  Care provided to: Patient  Conversation partners present during encounter Nurse  Referral source Code page  Reason for visit Code  OnCall Visit Yes   Chaplain responded to RRT page. Patient with care team. No family present. Chaplain services are available for follow up as needed.

## 2023-04-14 NOTE — Progress Notes (Signed)
PT Cancellation Note  Patient Details Name: Summerlyn Fickel MRN: [id number] DOB: 08/17/1956   Cancelled Treatment:    Reason Eval/Treat Not Completed: Medical issues which prohibited therapy (Consult received and chart reviewed.  Per primary RN, patient not medically appropriate and unable to tolerate mobility efforts this date. Will continue to follow and re-attempt next date as medically appropriate.)   Dyllon Henken H. Manson Passey, PT, DPT, NCS 04/14/23, 3:17 PM [phone]

## 2023-04-14 NOTE — Progress Notes (Signed)
OT Cancellation Note  Patient Details Name: Emma Gould MRN: [id number] DOB: 1956/10/28   Cancelled Treatment:    Reason Eval/Treat Not Completed: Medical issues which prohibited therapy. Pt with worsening respiratory status. Attempted to see for OT; per RN, hold today. Will attempt again when medically stable.  Alvester Morin 04/14/2023, 2:58 PM

## 2023-04-14 NOTE — Assessment & Plan Note (Signed)
Suspect due to fluid overload as pt was on fluids several days, also severely hypoalbuminemic with third-spacing.  BNP 223.4 and chest xray on 4/17 with interstitial edema.  Less likely pneumonia (procal is elevated, but her GI issues also explain that, and antibiotics are completed). --Supplement O2, target sats 90-94%, wean as tolerated --Lasix 20 mg x 1 today (got dose overnight and O2 weaned 15 >> 10 L/min) --Daily weights and I/O's --Monitor renal function, replace electrolytes --Incentive spirometry

## 2023-04-14 NOTE — Progress Notes (Addendum)
Progress Update PM pt with worsening hypoxia and nursing reported respiratory distress this afternoon.  Little urine output reported after 20 mg IV Lasix this AM, but I/O's not recorded.  Pt denies feeling short of breath but has rising oxygen requirements. --Check ABG, lactic acid, BNP, procal, BMP, troponin --BiPAP PRN ordered --Now on heated HF [place] @ 60 L/min --Chest xray repeated - interval worsening of diffuse opacities --Start empiric IV Vanc/Cefepime  --She received several days of IV fluids - Echo from 11/2022 normal EF but has significant valvular disease including moderate MR, mild-moderate TR, mild MR. --STRICT I/O's & DAILY WEIGHTS --Given severe hypoalbuminemia (albumin < 1.5) -- repeat 20 mg IV Lasix preceded by IV albumin to augment --Chest CT when able to tolerate --Transfer to stepdown for close monitoring, PCCM notified in case of need for intubation

## 2023-04-14 NOTE — Progress Notes (Signed)
[facility]  ROUNDING NOTE   Subjective:   Patient seen resting in bed Reports decreased stool occurrences however staff report 3 incontinent episodes overnight. Tolerating small meals  Potassium 3.4  Objective:  Vital signs in last 24 hours:  Temp:  [97.5 F (36.4 C)-98.2 F (36.8 C)] 97.5 F (36.4 C) (04/18 0831) Pulse Rate:  [88-101] 93 (04/18 0831) Resp:  [18-38] 20 (04/18 0831) BP: (103-126)/(51-90) 103/57 (04/18 0831) SpO2:  [70 %-100 %] 94 % (04/18 0831) FiO2 (%):  [100 %] 100 % (04/18 0547) Weight:  [70 kg] 70 kg (04/18 0500)  Weight change: -1 kg Filed Weights   04/08/2023 0430 04/13/23 0500 04/14/23 0500  Weight: 63.9 kg 71 kg 70 kg    Intake/Output: I/O last 3 completed shifts: In: 240 [P.O.:240] Out: -    Intake/Output this shift:  Total I/O In: 240 [P.O.:240] Out: -   Physical Exam: General: NAD  Head: Normocephalic, atraumatic. Moist oral mucosal membranes  Eyes: Anicteric  Lungs:  Clear to auscultation, normal effort  Heart: Regular rate and rhythm  Abdomen:  Soft, nontender  Extremities:  No peripheral edema.  Neurologic: Alert and oriented, moving all four extremities  Skin: No lesions  Access: None    Basic Metabolic Panel: Recent Labs  Lab 05-09-2023 0904 09-May-2023 1236 04/10/23 0447 04/11/23 0055 04/11/23 0525 04/11/23 0830 04/11/23 2306 03/29/2023 0358 04/11/2023 0800 04/15/2023 0847 04/13/23 0529 04/14/23 0500  NA 140  --  137  --  136  --   --  137  --   --  136 139  K 2.7*   < > 2.5*   < > 3.7   < > 3.0* 2.8* 3.3*  --  3.3* 3.4*  CL 98  --  103  --  107  --   --  110  --   --  113* 113*  CO2 27  --  23  --  21*  --   --  17*  --   --  15* 17*  GLUCOSE 87  --  67*  --  77  --   --  102*  --   --  76 88  BUN 31*  --  28*  --  19  --   --  15  --   --  13 15  CREATININE 2.84*  --  2.49*  --  1.73*  --   --  1.46*  --   --  1.27* 1.39*  CALCIUM 7.4*  --  6.9*  --  7.0*  --   --  6.9*  --   --  7.4* 7.9*  MG 1.0*  --   0.9*  --  2.9*  --   --   --   --  1.8 1.9 1.8  PHOS 3.5  --  3.4  --  2.3*  --   --   --   --   --   --  2.8   < > = values in this interval not displayed.     Liver Function Tests: Recent Labs  Lab 04/20/2023 1415 04/08/23 0523 04/14/23 0500  AST 38 34 48*  ALT 19 15 27   ALKPHOS 173* 141* 564*  BILITOT 0.8 0.6 0.7  PROT 6.2* 4.9* 4.2*  ALBUMIN 2.3* 1.9* <1.5*    Recent Labs  Lab 04/22/2023 1415  LIPASE 27    No results for input(s): "AMMONIA" in the last 168 hours.  CBC: Recent Labs  Lab 04/03/2023 1415  04/08/23 0523 04/10/23 0447 04/11/23 0525 04/06/2023 0358 04/13/23 0529 04/14/23 0500  WBC 16.3*   < > 12.0* 14.0* 14.5* 18.2* 16.6*  NEUTROABS 9.6*  --  6.3 9.0* 10.1* 13.8*  --   HGB 15.6*   < > 12.2 11.5* 10.8* 11.6* 10.8*  HCT 47.5*   < > 36.4 34.2* 31.8* 33.0* 31.0*  MCV 103.0*   < > 99.5 99.4 99.7 97.6 96.9  PLT 199   < > 114* 116* 94* 103* 96*   < > = values in this interval not displayed.     Cardiac Enzymes: No results for input(s): "CKTOTAL", "CKMB", "CKMBINDEX", "TROPONINI" in the last 168 hours.  BNP: Invalid input(s): "POCBNP"  CBG: Recent Labs  Lab 04/13/23 2105 04/14/23 0550 04/14/23 0559 04/14/23 0810 04/14/23 1146  GLUCAP 106* 75 83 131* 99     Microbiology: Results for orders placed or performed during the hospital encounter of 04/04/2023  Gastrointestinal Panel by PCR , Stool     Status: None   Collection Time: 04/08/23 11:40 AM   Specimen: Stool  Result Value Ref Range Status   Campylobacter species NOT DETECTED NOT DETECTED Final   Plesimonas shigelloides NOT DETECTED NOT DETECTED Final   Salmonella species NOT DETECTED NOT DETECTED Final   Yersinia enterocolitica NOT DETECTED NOT DETECTED Final   Vibrio species NOT DETECTED NOT DETECTED Final   Vibrio cholerae NOT DETECTED NOT DETECTED Final   Enteroaggregative E coli (EAEC) NOT DETECTED NOT DETECTED Final   Enteropathogenic E coli (EPEC) NOT DETECTED NOT DETECTED Final    Enterotoxigenic E coli (ETEC) NOT DETECTED NOT DETECTED Final   Shiga like toxin producing E coli (STEC) NOT DETECTED NOT DETECTED Final   Shigella/Enteroinvasive E coli (EIEC) NOT DETECTED NOT DETECTED Final   Cryptosporidium NOT DETECTED NOT DETECTED Final   Cyclospora cayetanensis NOT DETECTED NOT DETECTED Final   Entamoeba histolytica NOT DETECTED NOT DETECTED Final   Giardia lamblia NOT DETECTED NOT DETECTED Final   Adenovirus F40/41 NOT DETECTED NOT DETECTED Final   Astrovirus NOT DETECTED NOT DETECTED Final   Norovirus GI/GII NOT DETECTED NOT DETECTED Final   Rotavirus A NOT DETECTED NOT DETECTED Final   Sapovirus (I, II, IV, and V) NOT DETECTED NOT DETECTED Final    Comment: Performed at [facility], [address] [place], [place] [address]  C Difficile Quick Screen w PCR reflex     Status: None   Collection Time: 04/08/23 11:40 AM   Specimen: STOOL  Result Value Ref Range Status   C Diff antigen NEGATIVE NEGATIVE Final   C Diff toxin NEGATIVE NEGATIVE Final   C Diff interpretation No C. difficile detected.  Final    Comment: Performed at [facility], [address] [place], [place] [address]    Coagulation Studies: No results for input(s): "LABPROT", "INR" in the last 72 hours.  Urinalysis: No results for input(s): "COLORURINE", "LABSPEC", "PHURINE", "GLUCOSEU", "HGBUR", "BILIRUBINUR", "KETONESUR", "PROTEINUR", "UROBILINOGEN", "NITRITE", "LEUKOCYTESUR" in the last 72 hours.  Invalid input(s): "APPERANCEUR"    Imaging: DG Chest 1 View  Result Date: 04/13/2023 CLINICAL DATA:  Hypoxia. EXAM: CHEST  1 VIEW COMPARISON:  Chest radiograph dated 11/22/2022. FINDINGS: Faint diffuse interstitial and hazy airspace density primarily involving the mid to lower lung field may represent combination of atelectasis and edema. Pneumonia is not excluded. Clinical correlation is recommended. A small left pleural effusion suspected. No pneumothorax. The cardiac  silhouette is within normal limits. Atherosclerotic calcification of the aortic arch. No acute  osseous pathology. Cervical spine ACDF. IMPRESSION: Probable interstitial edema versus developing infiltrate. Electronically Signed   By: Elgie Collard M.D.   On: 04/13/2023 20:11     Medications:    sodium chloride 20 mL/hr at 04/25/2023 1204    vitamin C  500 mg Oral BID   carvedilol  6.25 mg Oral BID WC   Chlorhexidine Gluconate Cloth  6 each Topical Daily   cholecalciferol  1,000 Units Oral Daily   enoxaparin (LOVENOX) injection  40 mg Subcutaneous QHS   ezetimibe  10 mg Oral Daily   feeding supplement  1 Container Oral TID BM   folic acid  1 mg Oral Daily   lipase/protease/amylase  72,000 Units Oral TID WC   multivitamin with minerals  1 tablet Oral Daily   pantoprazole  40 mg Oral Daily   QUEtiapine  50 mg Oral BID   sertraline  100 mg Oral Daily   sodium chloride flush  10-40 mL Intracatheter Q12H   spironolactone  12.5 mg Oral Daily   topiramate  50 mg Oral BID   zinc sulfate  220 mg Oral Daily   acetaminophen **OR** acetaminophen, nitroGLYCERIN, ondansetron **OR** ondansetron (ZOFRAN) IV, oxyCODONE-acetaminophen, sodium chloride, sodium chloride flush, SUMAtriptan  Assessment/ Plan:  Ms. Emma Gould is a 67 y.o.  female  with past medical history consistent with hypertension, nonobstructive CAD, diastolic heart failure, and chronic kidney disease,, who was admitted to [facility] on 04/18/2023 for Colitis [K52.9] Acute colitis [K52.9] AKI (acute kidney injury) [N17.9]   Acute kidney injury on chronic kidney disease stage IIIa. Baseline creatinine appears to be 1.21 with GFR 50 on 02/26/2023. Acute kidney injury secondary to hypovolemia due to persistent diarrhea. No IV contrast exposure. Creatinine 3.17 on ED admission. Experience slight bump in creatinine overnight, may be contributed to continued diarrhea. Patient encouraged to maintain adequate oral intake.   Lab Results   Component Value Date   CREATININE 1.39 (H) 04/14/2023   CREATININE 1.27 (H) 04/13/2023   CREATININE 1.46 (H) 04/11/2023    Intake/Output Summary (Last 24 hours) at 04/14/2023 1237 Last data filed at 04/14/2023 1032 Gross per 24 hour  Intake 480 ml  Output --  Net 480 ml    2.  Severe hypokalemia/hypomagnesia.  Likely due to persistent diarrhea.  Potassium and magnesium corrected.   3.  Hypertension with chronic kidney disease.  Home regimen includes carvedilol, ezetimbre, spironolactone, and torsemide.  Receiving all medications.  Blood pressure slightly decreased, will monitor.   4.  Chronic diarrhea, etiology unknown.  C. difficile negative.  GI panel negative.  GI completed EGD and colonoscopy on 03/31/2023. EGD showed some gastritic while colonoscopy showed multiple polyps, severe colonic inflammation and internal hemorrhoids.     LOS: 7   4/18/202412:37 PM

## 2023-04-14 NOTE — Progress Notes (Signed)
   04/14/23 0510  Provider Notification  Provider Name/Title Bishop Limbo NP -C  Date Provider Notified 04/14/23  Time Provider Notified 0510  Method of Notification  (secure chat)  Notification Reason Change in status (desaturating to low 70s on O2 at 4 LPM/[place], with increasing AMS.)  Provider response At bedside  Date of Provider Response 04/14/23  Time of Provider Response [phone]   RN was alerted by NT for low O2 saturation at high 60s to low 70s despite being on O2 supplement at 4 LPM/[place]. Also observed for decrease in responsiveness and increasing confusion. CN, Resp therapist and Hospitalist on call made aware. Hospitalist responded to bedside to evaluate patient's status. RT also at bedside. Breathing treatment provided. CBG at 05:50am was 75 mg/dl. Orders acknowledge and carried out including administration of D50 and Sodium Bicarb intravenously. Patient's sensorium improved with NRM, saturating at 95 to 97%. Respiratory rate improved, ranging from 20 to 23 breaths per minute. Patient O2 supplement downgraded to high flow O2, saturating well at 94%. CBG rechecked at 05:50 revealed /dl. Patient resting in bed, denies any acute concerns and verbalized feeling a lot better.

## 2023-04-14 NOTE — Consult Note (Signed)
Pharmacy Antibiotic Note  Emma Gould is a 67 y.o. female admitted on 04/17/2023 with chronic diarrhea x 4 months. Completed 5 day course of antibiotics with ceftriaxone and flagyl during admission. GI cultures negative. S/p colonoscopy 4/16, biopsies pending. New respiratory distress noted 4/18 afternoon. Patient transferred to ICU. Pharmacy has been consulted for cefepime and vancomycin dosing for sepsis  Plan: Initiate cefepime 2 gram Q12H Initiate Vancomycin 1500 mg Q12H. Goal AUC 400-550 Estimated AUC 483 Scr 1.48, IBW, Vd 0.72    Height:  (160 cm) Weight: 70 kg (154 lb 5.2 oz) IBW/kg (Calculated) : 52.4  Temp (24hrs), Avg:97.9 F (36.6 C), Min:97.5 F (36.4 C), Max:98.2 F (36.8 C)  Recent Labs  Lab 04/10/23 0447 04/11/23 0525 04/02/2023 0358 04/13/23 0529 04/14/23 0500 04/14/23 1657  WBC 12.0* 14.0* 14.5* 18.2* 16.6*  --   CREATININE 2.49* 1.73* 1.46* 1.27* 1.39* 1.48*  LATICACIDVEN  --   --   --   --   --  3.1*    Estimated Creatinine Clearance: 36 mL/min (A) (by C-G formula based on SCr of 1.48 mg/dL (H)).    No Known Allergies  Antimicrobials this admission: 4/11 ceftriaxone >> 4/15 4/11 metronidazole >> 4/16 4/18 cefepime >> 4/18 Vancomycin >>  Dose adjustments this admission:   Microbiology results: 4/12: GI panel: neg 4/12: C. Diff: neg  BCx:   UCx:   4/18 MRSA PCR: neg  Thank you for allowing pharmacy to be a part of this patient's care.  Sharen Hones, PharmD, BCPS Clinical Pharmacist   04/14/2023 6:46 PM

## 2023-04-14 NOTE — Progress Notes (Signed)
   04/13/23 1958  Provider Notification  Provider Name/Title Bishop Limbo NP  Date Provider Notified 04/13/23  Time Provider Notified 1958  Method of Notification  (Secure chat)  Notification Reason Other (Comment) (MEWS 3 (Resp rate 38 breaths per minute).)  Provider response No new orders  Date of Provider Response 04/13/23  Time of Provider Response 2004   VBG resulted, relayed to Hospitalist on call. On 04/13/2023 at 2223, Sodium Bicarb and Furosemide IV administered at desired dose. Tolerated well with no AR.   Patient continues to have a MEWS score of 2-3 due to increase respiratory rate. Also noted for intermittent confusion. Safety sitter at bedside.

## 2023-04-14 NOTE — Progress Notes (Addendum)
Progress Note   Patient: Emma Gould ZOX:[id number] DOB: 18-Aug-1956 DOA: 04/03/2023     7 DOS: the patient was seen and examined on 04/14/2023   Brief hospital course: Emma Gould is a 67 y.o. female with medical history significant for hypertension, , CKD llla, nonobstructive CAD, dCHF, anxiety, with a 64-month history of chronic diarrhea, hospitalized early March 2024 at [facility] with critically low potassium of less than 2 and 3/18-3/22 at [facility] for noninfectious colitis complicated by AKI and hypokalemia, discharged on loperamide and Bentyl and referred to GI, who presents to the ED with a 2-week complaint of abdominal pain, generalized weakness, resulting in frequent falls, most recently on the day of arrival, not resulting in any injury.  She denies fever, chills, dysuria and denies cough, chest pain or shortness of breath.  Patient has persistent nausea and intermittent vomiting to where it is difficult to keep anything on and has associated epigastric discomfort.  Vomiting is nonbloody nonbilious and non-coffee-ground.  His stool is dark, liquid.  She has not seen a gastroenterologist for this problem. In the emergency room CT abdomen and pelvis shows findings that represent mild colitis infectious or inflammatory.   Further hospital course and management as outlined below.   PM Addendum - pt with worsening hypoxia and nursing reported respiratory distress this afternoon.  Little urine output reported after 20 mg IV Lasix this AM, but I/O's not recorded.  Pt denies feeling short of breath but has rising oxygen requirements. --Check lactic acid, BNP, procal, BMP, troponin --Chest CT when able to tolerate --Chest xray repeated - interval worsening of diffuse opacities --Start empiric IV Vanc/Cefepime  --She received several days of IV fluids - Echo from 11/2022 normal EF but has significant valvular disease including moderate MR, mild-moderate TR, mild MR. --STRICT I/O's & DAILY WEIGHTS --Given  severe hypoalbuminemia (albumin < 1.5) -- repeat 20 mg IV Lasix preceded by IV albumin to augment --Transfer to stepdown for close monitoring, PCCM notified in case of need for intubation  Assessment and Plan: * Chronic diarrhea of unknown origin See GI's notes and recommendations. Biopsies from Colonoscopy showed nonspecific colitis, inflammatory vs infectious. Completed antibiotics C diff and GI panel negative Supportive care  Acute metabolic encephalopathy Multifactorial - due to renal insufficiency, dehydration, electrolyte derangements, new onset hypoxia. --Mgmt of underlying issues as outlined --Delirium precautions --Sitter if needed for safety  Acute respiratory failure with hypoxia Suspect due to fluid overload as pt was on fluids several days, also severely hypoalbuminemic with third-spacing.  BNP 223.4 and chest xray on 4/17 with interstitial edema.  Less likely pneumonia (procal is elevated, but her GI issues also explain that, and antibiotics are completed). --Supplement O2, target sats 90-94%, wean as tolerated --Lasix 20 mg x 1 today (got dose overnight and O2 weaned 15 >> 10 L/min) --Daily weights and I/O's --Monitor renal function, replace electrolytes --Incentive spirometry  Hypophosphatemia Resolved.  Monitor and replace phos PRN  Hypomagnesemia Severe on admission, replaced Monitor Mg level & replace PRN  Increased anion gap metabolic acidosis POA Due to AKI on CKD IIIa. Monitor BMP off IV fluids Now with normal anion gap metabolic acidosis with improving renal function. Topamax can contribute to NAGMA, will discuss with patient and family re holding Topamax. --Treat underlying issues as outlined --Monitor BMP  Acute renal failure superimposed on stage 3a chronic kidney disease Increased anion gap metabolic acidosis AKI due to hypovolemia, chronic diarrhea Cr on admission 3.17, baseline around 1.2. - Stop IV fluids and monitor -  worsening  hypoxia --Appreciate Nephrology's input - Monitor and replace electrolytes - Diuretics with discontinued at last discharge from [facility] 3/22 -Symptomatic care with antidiarrheals  Hypokalemia Severe on admission continue repletion and monitoring secondary to ongoing chronic diarrhea --Continue replacement as needed   Pancolitis Chronic diarrhea of unknown originPatient presents with nausea vomiting and diarrhea, similar to hospitalization at [facility] from 3/18 to 3/22 GI infectious workup was negative with negative GI panel negative ova and parasites and negative C. difficile CT abdomen and pelvis showed colitis back in March and now shows mild colitis Patient has not yet followed up with GI. --Completed 5 days Rocephin/Flagyl - GI consulted, see their recs - Stool studies within normal limits - Managed with IV hydration and supportive care -- Stop IV fluids today and monitor for adequate PO hydration - Continue pancreatic enzymes as recommended by GI Underwent EGD/colonoscopy 03/28/2023. EGD showed gastritis and colonoscopy showed pancolitis biopsies were taken.  Also showed polyp that was removed and nonbleeding internal hemorrhoids. --Follow biopsies  Protein calorie malnutrition .  Generalized weakness Protein calorie malnutrition, suspected Frequent falls Secondary to ongoing chronic diarrhea, poor oral intake - Dietary consult - PT evaluation   Thrombocytopenia Platelets have down-trended, 92k today --Hold Lovenox --SCD's for DVT ppx --Monitor CBC  Hypoalbuminemia Suspect acute on chronic, negative acute phase reactant.  Albumin is < 1.5.  Pt with progressive peripheral edema due to third spacing, also hypoxic. --May need IV albumin to augment diuresis --Optimize nutrition  Polyp of ascending colon Per GI  AKI (acute kidney injury) CKD stage IIIa AKI likely pre-renal due to diarrhea / hypovolemia.  Renal function improving. --Stop IV fluids and monitor  BMP --Encourage PO hydration  Chronic diastolic CHF (congestive heart failure) Acute on Chronic diastolic CHF (not POA) Echo 12/10/2022 EF 60-65% and RHC revealed diastolic dysfunction  --Hypoxia suspect due to volume overload --Lasix 20 mg IV x 1 today, got dose overnight --Strict I/O's, daily weights --Defer Echo, last in December showed EF 60-65%, indeterminate diastolic parameters, moderate MR, mild-moderate TR, mild AR  CAD (coronary artery disease) History of nonobstructive CAD Continue carvedilol and ezetimibe  Migraine Continue Topamax and sumatriptan  Depression Continue sertraline        Subjective: Pt seen awake resting in bed this AM.  Reports ongoing diarrhea but less frequent.  Nausea without vomiting, still not tolerating much PO intake.  States abdomen hurts all over.  Denies feeling short of breath or having fevers or chills.  Feels like the nasal cannula is "smothering" her, pulls it out a couple of times during encounter.  Sitter at bedside.  Physical Exam: Vitals:   04/14/23 0547 04/14/23 0549 04/14/23 0831 04/14/23 1253  BP:   (!) 103/57   Pulse:   93 (!) 102  Resp:  20 20   Temp:   (!) 97.5 F (36.4 C)   TempSrc:   Oral   SpO2: 100% 92% 94% 94%  Weight:      Height:       General exam: awake, alert, no acute distress HEENT: HF [place] in place, moist mucus membranes, hearing grossly normal  Respiratory system: bibasilar crackles and diminished bases, normal respiratory effort at rest on 10 L/min HF [place] O2. Cardiovascular system: normal S1/S2, RRR, 1+ BLE edema.   Gastrointestinal system: soft, NT, ND, no HSM felt, +bowel sounds. Central nervous system: A&O x3. no gross focal neurologic deficits, normal speech Extremities: moves all , no edema, normal tone Skin: dry, intact, normal temperature, ecchymosis  on anterior upper chest and b/l UE's Psychiatry: normal mood, congruent affect   Data Reviewed:  Notable labs --- K 3.4, Cl 113, bicarb 15>>17,  Cr 1.27>>1.39, Ca 7.9, alk phos 564, albumin < 1.5, AST 48, Tprotein 4.2, GFR 42   Procal 1.68 D-dimer negative 0.36 Procal 1.68 CBC with WBC 18.2>>16.6, Hbg 11.6 >> 10.8, platelets 96 k   Family Communication: Daughter, Emma Gould, updated by phone this afternoon [phone])  Disposition: Status is: Inpatient Remains inpatient appropriate because: worsening hypoxia, ongoing inadequate PO intake, ongoing close monitoring for stability of electrolytes and renal function.   Planned Discharge Destination: Home    Time spent: 46 minutes  Author: Pennie Banter, DO 04/14/2023 1:42 PM  For on call review [email or web address].

## 2023-04-14 NOTE — Assessment & Plan Note (Signed)
Multifactorial - due to renal insufficiency, dehydration, electrolyte derangements, new onset hypoxia. --Mgmt of underlying issues as outlined --Delirium precautions --Sitter if needed for safety

## 2023-04-15 ENCOUNTER — Inpatient Hospital Stay (HOSPITAL_COMMUNITY)
Admit: 2023-04-15 | Discharge: 2023-04-15 | Disposition: A | Discharge status: Home / Self Care | Payer: Medicare Other | Attending: Internal Medicine | Admitting: Internal Medicine

## 2023-04-15 ENCOUNTER — Inpatient Hospital Stay: Payer: Medicare Other

## 2023-04-15 ENCOUNTER — Ambulatory Visit: Payer: Medicare Other

## 2023-04-15 DIAGNOSIS — K529 Noninfective gastroenteritis and colitis, unspecified: Secondary | ICD-10-CM | POA: Diagnosis not present

## 2023-04-15 DIAGNOSIS — I5032 Chronic diastolic (congestive) heart failure: Secondary | ICD-10-CM

## 2023-04-15 DIAGNOSIS — R7989 Other specified abnormal findings of blood chemistry: Secondary | ICD-10-CM

## 2023-04-15 DIAGNOSIS — G931 Anoxic brain damage, not elsewhere classified: Secondary | ICD-10-CM

## 2023-04-15 DIAGNOSIS — I469 Cardiac arrest, cause unspecified: Secondary | ICD-10-CM

## 2023-04-15 LAB — GLUCOSE, CAPILLARY
Glucose-Capillary: 104 mg/dL — ABNORMAL HIGH (ref 70–99)
Glucose-Capillary: 110 mg/dL — ABNORMAL HIGH (ref 70–99)
Glucose-Capillary: 121 mg/dL — ABNORMAL HIGH (ref 70–99)
Glucose-Capillary: 139 mg/dL — ABNORMAL HIGH (ref 70–99)
Glucose-Capillary: 99 mg/dL (ref 70–99)

## 2023-04-15 LAB — CBC WITH DIFFERENTIAL/PLATELET
Abs Immature Granulocytes: 1.67 10*3/uL — ABNORMAL HIGH (ref 0.00–0.07)
Basophils Absolute: 0.1 10*3/uL (ref 0.0–0.1)
Basophils Relative: 1 %
Eosinophils Absolute: 0 10*3/uL (ref 0.0–0.5)
Eosinophils Relative: 0 %
HCT: 30.6 % — ABNORMAL LOW (ref 36.0–46.0)
Hemoglobin: 10.3 g/dL — ABNORMAL LOW (ref 12.0–15.0)
Immature Granulocytes: 10 %
Lymphocytes Relative: 5 %
Lymphs Abs: 0.8 10*3/uL (ref 0.7–4.0)
MCH: 34 pg (ref 26.0–34.0)
MCHC: 33.7 g/dL (ref 30.0–36.0)
MCV: 101 fL — ABNORMAL HIGH (ref 80.0–100.0)
Monocytes Absolute: 1.1 10*3/uL — ABNORMAL HIGH (ref 0.1–1.0)
Monocytes Relative: 6 %
Neutro Abs: 12.8 10*3/uL — ABNORMAL HIGH (ref 1.7–7.7)
Neutrophils Relative %: 78 %
Platelets: 43 10*3/uL — ABNORMAL LOW (ref 150–400)
RBC: 3.03 MIL/uL — ABNORMAL LOW (ref 3.87–5.11)
RDW: 16.3 % — ABNORMAL HIGH (ref 11.5–15.5)
Smear Review: NORMAL
WBC: 16.5 10*3/uL — ABNORMAL HIGH (ref 4.0–10.5)
nRBC: 9.2 % — ABNORMAL HIGH (ref 0.0–0.2)

## 2023-04-15 LAB — D-DIMER, QUANTITATIVE: D-Dimer, Quant: >20 ug/mL-FEU — ABNORMAL HIGH (ref 0.00–0.50)

## 2023-04-15 LAB — ECHOCARDIOGRAM COMPLETE
AR max vel: 2.74 cm2
AV Area VTI: 2.9 cm2
AV Area mean vel: 2.6 cm2
AV Mean grad: 3 mmHg
AV Peak grad: 7 mmHg
Ao pk vel: 1.32 m/s
Area-P 1/2: 4.46 cm2
Height: 63 in
MV VTI: 4.16 cm2
S' Lateral: 3.2 cm
Weight: 2469.15 oz

## 2023-04-15 LAB — BLOOD GAS, ARTERIAL
Acid-base deficit: 4.5 mmol/L — ABNORMAL HIGH (ref 0.0–2.0)
Acid-base deficit: 3.3 mmol/L — ABNORMAL HIGH (ref 0.0–2.0)
Bicarbonate: 21.1 mmol/L (ref 20.0–28.0)
Bicarbonate: 19.1 mmol/L — ABNORMAL LOW (ref 20.0–28.0)
FIO2: 100 %
FIO2: 80 %
MECHVT: 450 mL
MECHVT: 450 mL
Mechanical Rate: 16
Mechanical Rate: 16
O2 Saturation: 84.5 %
O2 Saturation: 98.9 %
PEEP: 5 cmH2O
PEEP: 10 cmH2O
Patient temperature: 37
Patient temperature: 37
RATE: 16 resp/min
pCO2 arterial: 40 mmHg (ref 32–48)
pCO2 arterial: 25 mmHg — ABNORMAL LOW (ref 32–48)
pH, Arterial: 7.33 — ABNORMAL LOW (ref 7.35–7.45)
pH, Arterial: 7.49 — ABNORMAL HIGH (ref 7.35–7.45)
pO2, Arterial: 55 mmHg — ABNORMAL LOW (ref 83–108)
pO2, Arterial: 159 mmHg — ABNORMAL HIGH (ref 83–108)

## 2023-04-15 LAB — COMPREHENSIVE METABOLIC PANEL
ALT: 164 U/L — ABNORMAL HIGH (ref 0–44)
AST: 979 U/L — ABNORMAL HIGH (ref 15–41)
Albumin: 1.5 g/dL — ABNORMAL LOW (ref 3.5–5.0)
Alkaline Phosphatase: 666 U/L — ABNORMAL HIGH (ref 38–126)
Anion gap: 15 (ref 5–15)
BUN: 16 mg/dL (ref 8–23)
CO2: 17 mmol/L — ABNORMAL LOW (ref 22–32)
Calcium: 9.2 mg/dL (ref 8.9–10.3)
Chloride: 113 mmol/L — ABNORMAL HIGH (ref 98–111)
Creatinine, Ser: 1.69 mg/dL — ABNORMAL HIGH (ref 0.44–1.00)
GFR, Estimated: 34 mL/min — ABNORMAL LOW (ref >60)
Glucose, Bld: 120 mg/dL — ABNORMAL HIGH (ref 70–99)
Potassium: 3.5 mmol/L (ref 3.5–5.1)
Sodium: 145 mmol/L (ref 135–145)
Total Bilirubin: 0.8 mg/dL (ref 0.3–1.2)
Total Protein: 4 g/dL — ABNORMAL LOW (ref 6.5–8.1)

## 2023-04-15 LAB — PROTIME-INR
INR: 2.4 — ABNORMAL HIGH (ref 0.8–1.2)
Prothrombin Time: 25.7 seconds — ABNORMAL HIGH (ref 11.4–15.2)

## 2023-04-15 LAB — CBC
HCT: 30.8 % — ABNORMAL LOW (ref 36.0–46.0)
Hemoglobin: 10.3 g/dL — ABNORMAL LOW (ref 12.0–15.0)
MCH: 33.7 pg (ref 26.0–34.0)
MCHC: 33.4 g/dL (ref 30.0–36.0)
MCV: 100.7 fL — ABNORMAL HIGH (ref 80.0–100.0)
Platelets: 52 10*3/uL — ABNORMAL LOW (ref 150–400)
RBC: 3.06 MIL/uL — ABNORMAL LOW (ref 3.87–5.11)
RDW: 16.3 % — ABNORMAL HIGH (ref 11.5–15.5)
WBC: 16.2 10*3/uL — ABNORMAL HIGH (ref 4.0–10.5)
nRBC: 8.6 % — ABNORMAL HIGH (ref 0.0–0.2)

## 2023-04-15 LAB — BLOOD GAS, VENOUS
Bicarbonate: 15.7 mmol/L — ABNORMAL LOW (ref 20.0–28.0)
Patient temperature: 37
pCO2, Ven: 43 mmHg — ABNORMAL LOW (ref 44–60)
pO2, Ven: 51 mmHg — ABNORMAL HIGH (ref 32–45)

## 2023-04-15 LAB — TROPONIN I (HIGH SENSITIVITY)
Troponin I (High Sensitivity): 576 ng/L (ref <18)
Troponin I (High Sensitivity): 1276 ng/L (ref <18)
Troponin I (High Sensitivity): 2315 ng/L (ref <18)

## 2023-04-15 LAB — BRAIN NATRIURETIC PEPTIDE: B Natriuretic Peptide: 200.7 pg/mL — ABNORMAL HIGH (ref 0.0–100.0)

## 2023-04-15 LAB — LACTIC ACID, PLASMA
Lactic Acid, Venous: 7.7 mmol/L (ref 0.5–1.9)
Lactic Acid, Venous: 6 mmol/L (ref 0.5–1.9)
Lactic Acid, Venous: 4.6 mmol/L (ref 0.5–1.9)

## 2023-04-15 LAB — MAGNESIUM: Magnesium: 1.8 mg/dL (ref 1.7–2.4)

## 2023-04-15 LAB — TECHNOLOGIST SMEAR REVIEW: Plt Morphology: NORMAL

## 2023-04-15 LAB — FIBRINOGEN: Fibrinogen: 284 mg/dL (ref 210–475)

## 2023-04-15 LAB — CULTURE, RESPIRATORY W GRAM STAIN

## 2023-04-15 LAB — PHOSPHORUS: Phosphorus: 6 mg/dL — ABNORMAL HIGH (ref 2.5–4.6)

## 2023-04-15 LAB — LIPASE, BLOOD: Lipase: 14 U/L (ref 11–51)

## 2023-04-15 LAB — PROCALCITONIN: Procalcitonin: 1.8 ng/mL

## 2023-04-15 MED ORDER — PIPERACILLIN-TAZOBACTAM 3.375 G IVPB
3.3750 g | Freq: Three times a day (TID) | INTRAVENOUS | Status: DC
  Administered 2023-04-15 – 2023-04-16 (×3): 3.375 g via INTRAVENOUS
  Filled 2023-04-15 (×3): qty 50

## 2023-04-15 MED ORDER — NOREPINEPHRINE 16 MG/250ML-% IV SOLN
0.0000 ug/min | INTRAVENOUS | Status: DC
  Administered 2023-04-15: 8 ug/min via INTRAVENOUS
  Administered 2023-04-16 (×2): 40 ug/min via INTRAVENOUS
  Filled 2023-04-15: qty 250
  Filled 2023-04-15: qty 500
  Filled 2023-04-15 (×2): qty 250

## 2023-04-15 MED ORDER — POLYETHYLENE GLYCOL 3350 17 G PO PACK
17.0000 g | PACK | Freq: Every day | ORAL | Status: DC
  Administered 2023-04-16: 17 g
  Filled 2023-04-15: qty 1

## 2023-04-15 MED ORDER — ZINC SULFATE 220 (50 ZN) MG PO CAPS
220.0000 mg | ORAL_CAPSULE | Freq: Every day | ORAL | Status: DC
  Administered 2023-04-16: 220 mg
  Filled 2023-04-15: qty 1

## 2023-04-15 MED ORDER — DOCUSATE SODIUM 50 MG/5ML PO LIQD
100.0000 mg | Freq: Two times a day (BID) | ORAL | Status: DC
  Administered 2023-04-15 – 2023-04-16 (×2): 100 mg
  Filled 2023-04-15 (×2): qty 10

## 2023-04-15 MED ORDER — SODIUM BICARBONATE 8.4 % IV SOLN
INTRAVENOUS | Status: AC
  Filled 2023-04-15: qty 50

## 2023-04-15 MED ORDER — ACETAMINOPHEN 650 MG RE SUPP: 650.0000 mg | Freq: Four times a day (QID) | RECTAL | Status: DC | PRN

## 2023-04-15 MED ORDER — EZETIMIBE 10 MG PO TABS
10.0000 mg | ORAL_TABLET | Freq: Every day | ORAL | Status: DC
  Administered 2023-04-15: 10 mg
  Filled 2023-04-15: qty 1

## 2023-04-15 MED ORDER — LACTATED RINGERS IV BOLUS
1000.0000 mL | Freq: Once | INTRAVENOUS | Status: AC
  Administered 2023-04-15: 1000 mL via INTRAVENOUS

## 2023-04-15 MED ORDER — ORAL CARE MOUTH RINSE
15.0000 mL | OROMUCOSAL | Status: DC
  Administered 2023-04-15 – 2023-04-16 (×14): 15 mL via OROMUCOSAL

## 2023-04-15 MED ORDER — PERFLUTREN LIPID MICROSPHERE
1.0000 mL | INTRAVENOUS | Status: AC | PRN
  Administered 2023-04-15: 5 mL via INTRAVENOUS

## 2023-04-15 MED ORDER — PROPOFOL 1000 MG/100ML IV EMUL: 5.0000 ug/kg/min | INTRAVENOUS | Status: DC

## 2023-04-15 MED ORDER — FENTANYL CITRATE PF 50 MCG/ML IJ SOSY: 50.0000 ug | PREFILLED_SYRINGE | INTRAMUSCULAR | Status: DC | PRN

## 2023-04-15 MED ORDER — ORAL CARE MOUTH RINSE: 15.0000 mL | OROMUCOSAL | Status: DC | PRN

## 2023-04-15 MED ORDER — INSULIN ASPART 100 UNIT/ML IJ SOLN: 0.0000 [IU] | INTRAMUSCULAR | Status: DC

## 2023-04-15 MED ORDER — MIDAZOLAM HCL 2 MG/2ML IJ SOLN: 1.0000 mg | INTRAMUSCULAR | Status: DC | PRN

## 2023-04-15 MED ORDER — STERILE WATER FOR INJECTION IV SOLN
INTRAVENOUS | Status: DC
  Filled 2023-04-15: qty 1000
  Filled 2023-04-15: qty 150

## 2023-04-15 MED ORDER — CHLORHEXIDINE GLUCONATE CLOTH 2 % EX PADS: 6.0000 | MEDICATED_PAD | Freq: Every day | CUTANEOUS | Status: DC

## 2023-04-15 MED ORDER — NOREPINEPHRINE 4 MG/250ML-% IV SOLN
INTRAVENOUS | Status: AC
  Administered 2023-04-15: 4 ug/min via INTRAVENOUS
  Filled 2023-04-15: qty 250

## 2023-04-15 MED ORDER — VITAMIN D 25 MCG (1000 UNIT) PO TABS
1000.0000 [IU] | ORAL_TABLET | Freq: Every day | ORAL | Status: DC
  Administered 2023-04-16: 1000 [IU]
  Filled 2023-04-15: qty 1

## 2023-04-15 MED ORDER — VASOPRESSIN 20 UNITS/100 ML INFUSION FOR SHOCK
0.0000 [IU]/min | INTRAVENOUS | Status: DC
  Administered 2023-04-16: 0.03 [IU]/min via INTRAVENOUS
  Filled 2023-04-15 (×2): qty 100

## 2023-04-15 MED ORDER — ADULT MULTIVITAMIN W/MINERALS CH
1.0000 | ORAL_TABLET | Freq: Every day | ORAL | Status: DC
  Administered 2023-04-16: 1
  Filled 2023-04-15: qty 1

## 2023-04-15 MED ORDER — VITAMIN C 500 MG PO TABS
500.0000 mg | ORAL_TABLET | Freq: Two times a day (BID) | ORAL | Status: DC
  Administered 2023-04-15 – 2023-04-16 (×2): 500 mg
  Filled 2023-04-15 (×2): qty 1

## 2023-04-15 MED ORDER — NOREPINEPHRINE 4 MG/250ML-% IV SOLN: 0.0000 ug/min | INTRAVENOUS | Status: DC

## 2023-04-15 MED ORDER — SERTRALINE HCL 50 MG PO TABS
100.0000 mg | ORAL_TABLET | Freq: Every day | ORAL | Status: DC
  Administered 2023-04-16: 100 mg
  Filled 2023-04-15: qty 2

## 2023-04-15 MED ORDER — FOLIC ACID 1 MG PO TABS
1.0000 mg | ORAL_TABLET | Freq: Every day | ORAL | Status: DC
  Administered 2023-04-16: 1 mg
  Filled 2023-04-15: qty 1

## 2023-04-15 MED ORDER — FAMOTIDINE 20 MG PO TABS: 20.0000 mg | ORAL_TABLET | Freq: Two times a day (BID) | ORAL | Status: DC

## 2023-04-15 MED ORDER — VANCOMYCIN VARIABLE DOSE PER UNSTABLE RENAL FUNCTION (PHARMACIST DOSING): Status: DC

## 2023-04-15 MED ORDER — SODIUM BICARBONATE 8.4 % IV SOLN
50.0000 meq | Freq: Once | INTRAVENOUS | Status: AC
  Administered 2023-04-15: 50 meq via INTRAVENOUS
  Filled 2023-04-15: qty 50

## 2023-04-15 MED ORDER — MAGNESIUM SULFATE 2 GM/50ML IV SOLN
2.0000 g | Freq: Once | INTRAVENOUS | Status: AC
  Administered 2023-04-15: 2 g via INTRAVENOUS
  Filled 2023-04-15: qty 50

## 2023-04-15 MED ORDER — PANTOPRAZOLE SODIUM 40 MG IV SOLR
40.0000 mg | INTRAVENOUS | Status: DC
  Administered 2023-04-15: 40 mg via INTRAVENOUS
  Filled 2023-04-15: qty 10

## 2023-04-15 MED ORDER — HEPARIN (PORCINE) 25000 UT/250ML-% IV SOLN
1200.0000 [IU]/h | INTRAVENOUS | Status: DC
  Administered 2023-04-15: 1200 [IU]/h via INTRAVENOUS
  Filled 2023-04-15: qty 250

## 2023-04-15 MED ORDER — ACETAMINOPHEN 325 MG PO TABS: 650.0000 mg | ORAL_TABLET | Freq: Four times a day (QID) | ORAL | Status: DC | PRN

## 2023-04-15 MED ORDER — HEPARIN BOLUS VIA INFUSION
5000.0000 [IU] | Freq: Once | INTRAVENOUS | Status: AC
  Administered 2023-04-15: 5000 [IU] via INTRAVENOUS
  Filled 2023-04-15: qty 5000

## 2023-04-15 NOTE — Progress Notes (Signed)
OT Cancellation Note  Patient Details Name: Emma Gould MRN: [id number] DOB: Jun 27, 1956   Cancelled Treatment:    Reason Eval/Treat Not Completed: Medical issues which prohibited therapy. Per chart review, code blue called this morning and CPR initiated. Pt is currently a red MEWs of 8 and not appropriate for OT intervention at this time. OT to hold today and re-attempt when pt is medically able to participate.   Jackquline Denmark, MS, OTR/L , CBIS ascom [phone]  04/15/23, 9:01 AM

## 2023-04-15 NOTE — Procedures (Signed)
Arterial Catheter Insertion Procedure Note  Emma Gould  [id number]  1956-05-31  Date:04/15/23  Time:8:59 AM    Provider Performing: Judithe Modest    Procedure: Insertion of Arterial Line (27253) with US guidance (66440)   Indication(s) Blood pressure monitoring and/or need for frequent ABGs  Consent Unable to obtain consent due to emergent nature of procedure.  Anesthesia None   Time Out Verified patient identification, verified procedure, site/side was marked, verified correct patient position, special equipment/implants available, medications/allergies/relevant history reviewed, required imaging and test results available.   Sterile Technique Maximal sterile technique including full sterile barrier drape, hand hygiene, sterile gown, sterile gloves, mask, hair covering, sterile ultrasound probe cover (if used).   Procedure Description Area of catheter insertion was cleaned with chlorhexidine and draped in sterile fashion. With real-time ultrasound guidance an arterial catheter was placed into the left femoral artery.  Appropriate arterial tracings confirmed on monitor.     Complications/Tolerance None; patient tolerated the procedure well.   EBL Minimal   Specimen(s) None   BIOPATCH applied to the insertion site.   Emma Gould, AGACNP-BC [facility] Prefer epic messenger for cross cover needs If after hours, please call E-link

## 2023-04-15 NOTE — Plan of Care (Signed)
Continuing with plan of care. 

## 2023-04-15 NOTE — Progress Notes (Signed)
   04/15/23 0625  Spiritual Encounters  Type of Visit Initial  Referral source Code page  Reason for visit Code  OnCall Visit Yes   Chaplain responded to Code Blue. No family present.

## 2023-04-15 NOTE — Progress Notes (Signed)
CODE BLUE NOTE  Patient Name: Emma Gould   MRN: [id number]   Date of Birth/ Sex: 05/31/1956 , female      Admission Date: 04/13/2023  Attending Provider: Vida Rigger, MD  Primary Diagnosis: Chronic diarrhea of unknown origin   Cardiopulmonary Resuscitation Directed by: BL Rust Chest NP and BL Jon Billings NP   I personally directed ancillary staff and/or performed CPR in an effort to regain return of spontaneous circulation.  BL Rust [employer], PCCM , and Donnie Mesa NP [facility] MD present in room.  Indication: Pt was in her usual state of health until this AM, when she was noted to be . Code blue was subsequently called. At the time of arrival on scene, ACLS protocol was underway.    Technical Description:  - CPR performance duration:  8 minutes  - Was defibrillation or cardioversion used? No   - Was external pacer placed? Yes  - Was patient intubated pre/post CPR? Yes    Medications Administered: Y = Yes; Blank = No Amiodarone    Atropine  1  Calcium  1  Epinephrine  3  Lidocaine    Magnesium    Norepinephrine    Phenylephrine    Sodium bicarbonate  3  Vasopressin   D50              Post CPR evaluation:  - Final Status - Was patient successfully resuscitated ? Yes - What is current rhythm? Sinus tachycardia - What is current hemodynamic status? stable   Miscellaneous Information:  - Labs sent, including: Per ccm team troponin, lactic, cmp, mag phos  - Primary team notified?  Yes  - Family Notified? Yes significant other via phon Emma Gould  - Additional notes/ transfer status: Was in step down CCM to take over      ACLS H's and T's  -Hypovolemia  -Hypoxia -Hydrogen Ion excess (acidosis) -Hypoglycemia -Hypokalemia / Hyperkalemia  -Hypothermia  -Tension pneumothorax  -Toxins  -Thrombosis PE / MI  Acidosis present - likely thromboembolic event  Donnie Mesa NP [facility]

## 2023-04-15 NOTE — Procedures (Signed)
Intubation Procedure Note  Emma Gould  [id number]  Feb 04, 1956  Date:04/15/23  Time:6:39 AM   Provider Performing:Marine Lezotte L Rust-Chester    Procedure: Intubation (31500)  Indication(s) Respiratory Failure  Consent Unable to obtain consent due to emergent nature of procedure.   Anesthesia CPR in progress   Time Out Verified patient identification, verified procedure, site/side was marked, verified correct patient position, special equipment/implants available, medications/allergies/relevant history reviewed, required imaging and test results available.   Sterile Technique Usual hand hygeine, masks, and gloves were used   Procedure Description Patient positioned in bed supine.  Sedation given as noted above.  Patient was intubated with endotracheal tube using Glidescope.  View was Grade 1 full glottis .  Number of attempts was 1.  Colorimetric CO2 detector was consistent with tracheal placement.   Complications/Tolerance None; patient tolerated the procedure well. Chest X-ray is ordered to verify placement.   EBL Minimal   Specimen(s) None   Betsey Holiday, AGACNP-BC Acute Care Nurse Practitioner [facility]   [phone] / [phone] Please see Amion for pager details.

## 2023-04-15 NOTE — ED Provider Notes (Signed)
[facility] Department of Emergency Medicine   Code Blue CONSULT NOTE  Chief Complaint: Cardiac arrest/unresponsive   Level V Caveat: Unresponsive  History of present illness: I was contacted by the hospital for a CODE BLUE cardiac arrest upstairs and presented to the patient's bedside.   CPR in progress on this patient.  E-Link physician electronically present.  Intubated by ICU nurse practitioner while I observed and provided support.  ROS: Unable to obtain, Level V caveat  Scheduled Meds:  vitamin C  500 mg Oral BID   carvedilol  6.25 mg Oral BID WC   Chlorhexidine Gluconate Cloth  6 each Topical Daily   cholecalciferol  1,000 Units Oral Daily   ezetimibe  10 mg Oral Daily   feeding supplement  1 Container Oral TID BM   folic acid  1 mg Oral Daily   lipase/protease/amylase  72,000 Units Oral TID WC   multivitamin with minerals  1 tablet Oral Daily   pantoprazole  40 mg Oral Daily   QUEtiapine  50 mg Oral BID   sertraline  100 mg Oral Daily   sodium bicarbonate       sodium chloride flush  10-40 mL Intracatheter Q12H   spironolactone  12.5 mg Oral Daily   topiramate  50 mg Oral BID   zinc sulfate  220 mg Oral Daily   Continuous Infusions:  sodium chloride 10 mL/hr at 04/15/23 0445   ceFEPime (MAXIPIME) IV Stopped (04/14/23 2146)   vancomycin Stopped (04/14/23 2349)   PRN Meds:.acetaminophen **OR** acetaminophen, ipratropium-albuterol, nitroGLYCERIN, ondansetron **OR** ondansetron (ZOFRAN) IV, oxyCODONE-acetaminophen, prochlorperazine, sodium bicarbonate, sodium chloride, sodium chloride flush, SUMAtriptan Past Medical History:  Diagnosis Date   Anxiety    CHF (congestive heart failure)    CKD (chronic kidney disease)    Depression    Fibromyalgia    Hypertension    Past Surgical History:  Procedure Laterality Date   CERVICAL FUSION     COLONOSCOPY N/A 04/05/2023   Procedure: COLONOSCOPY;  Surgeon: Midge Minium, MD;  Location: [facility] ENDOSCOPY;   Service: Endoscopy;  Laterality: N/A;   ESOPHAGOGASTRODUODENOSCOPY (EGD) WITH PROPOFOL N/A 04/11/2023   Procedure: ESOPHAGOGASTRODUODENOSCOPY (EGD) WITH PROPOFOL;  Surgeon: Midge Minium, MD;  Location: [facility] ENDOSCOPY;  Service: Endoscopy;  Laterality: N/A;   RIGHT/LEFT HEART CATH AND CORONARY ANGIOGRAPHY Bilateral 01/26/2023   Procedure: RIGHT/LEFT HEART CATH AND CORONARY ANGIOGRAPHY;  Surgeon: Dolores Patty, MD;  Location: [facility] INVASIVE CV LAB;  Service: Cardiovascular;  Laterality: Bilateral;   Social History   Socioeconomic History   Marital status: Widowed    Spouse name: Not on file   Number of children: Not on file   Years of education: Not on file   Highest education level: Not on file  Occupational History   Not on file  Tobacco Use   Smoking status: Every Day    Packs/day: 0.50    Years: 45.00    Additional pack years: 0.00    Total pack years: 22.50    Types: Cigarettes   Smokeless tobacco: Never  Substance and Sexual Activity   Alcohol use: Never   Drug use: Never   Sexual activity: Not on file  Other Topics Concern   Not on file  Social History Narrative   Not on file   Social Determinants of Health   Financial Resource Strain: Not on file  Food Insecurity: No Food Insecurity (04/18/2023)   Hunger Vital Sign    Worried About Running Out of Food in the Last Year:  Never true    Ran Out of Food in the Last Year: Never true  Transportation Needs: No Transportation Needs (03/31/2023)   PRAPARE - Administrator, Civil Service (Medical): No    Lack of Transportation (Non-Medical): No  Physical Activity: Not on file  Stress: Not on file  Social Connections: Not on file  Intimate Partner Violence: Not At Risk (04/13/2023)   Humiliation, Afraid, Rape, and Kick questionnaire    Fear of Current or Ex-Partner: No    Emotionally Abused: No    Physically Abused: No    Sexually Abused: No   No Known Allergies  Last set of Vital Signs (not  current) Vitals:   04/15/23 0400 04/15/23 0500  BP: 102/62 107/61  Pulse: 78 78  Resp: (!) 28 (!) 25  Temp: 99.3 F (37.4 C)   SpO2: 100% 100%      Physical Exam  Gen: unresponsive Cardiovascular: pulseless  Resp: apneic. Breath sounds equal bilaterally with bagging  Abd: nondistended  Neuro: GCS 3, unresponsive to pain  HEENT: No blood in posterior pharynx, gag reflex absent  Neck: No crepitus  Musculoskeletal: No deformity  Skin: warm  Procedures (when applicable, including Critical Care time): Procedures   MDM / Assessment and Plan Intubated by ICU team.  ROSC care    Delton Prairie, MD 04/15/23 [phone]

## 2023-04-15 NOTE — Consult Note (Signed)
Cardiology Consultation:   Patient ID: Emma Gould; [id number]; Aug 07, 1956   Admit date: 04/03/2023 Date of Consult: 04/15/2023  Primary Care Provider: Randel Gould, Emma Russell, MD Primary Cardiologist: Gould Primary Electrophysiologist:  None   Patient Profile:   Emma Gould is a 67 y.o. female with a hx of minimal nonobstructive CAD by LHC in 12/2022: HFpEF with mild mixed pulmonary hypertension and evidence of RV dysfunction, HTN, fibromyalgia, CKD stage IIIa, 60-month history of chronic diarrhea, tobacco use, and anxiety, and depression who is being seen today for the evaluation of cardiac arrest at the request of Emma Ditty, NP.  History of Present Illness:   Emma Gould was seen in the ED in 10/2022 with swelling in her face, hands, and legs.  She had not been taking carvedilol correctly or HCTZ at home.  She refused oral Lasix.  Subsequent echo in 11/2022 showed an EF of 60 to 65%, normal diastolic function, normal RV systolic function, mild to moderate tricuspid regurgitation, and an RVSP of 32 mmHg.  R/LHC in 12/2022 showed minimal nonobstructive CAD with mild mixed pulmonary hypertension with elevated right-sided pressures and evidence of RV dysfunction with normal cardiac output.  As an outpatient, she has been maintained on torsemide, spironolactone, and Jardiance.  She was admitted in early 02/2023 with critically low potassium of less than 2 and again later in 02/2023 at [facility] for noninfectious colitis complicated by AKI and hypokalemia.  She was admitted to [facility] on 4/11 with continued abdominal pain with chronic diarrhea and acute metabolic encephalopathy.  High-sensitivity troponin obtained on 4/17 of 43 with a delta troponin of 96 trending to 85 on 4/18.  BNP 223 at that time.  Biopsies from colonoscopy showed nonspecific colitis.  EGD consistent with gastritis.  C. difficile and GI panel negative.  She was noted to have severe electrolyte derangements that were repleted.  On  4/18, she developed worsening dyspnea with associated hypoxia and diminished urine output.  She was requiring heated high flow nasal cannula at 60 L/min.  Repeat chest x-ray showed interval worsening of diffuse opacities.  She was started on empiric antibiotics.  Given severe hypoalbuminemia with an +96 26.28-year-old she was started on IV Lasix augmented by IV albumin.  BNP 208.  On the morning of 04/15/2023, the patient became bradycardic, initially with sinus bradycardia in the 50s which subsequently transition to junctional with a rate of 27 bpm followed by episodes of WCT and intermittent bradycardia.  PEA was noted on the monitor.  The patient lost pulse and CODE BLUE was called.  She underwent 4 rounds of CPR with 3 total milligrams of epinephrine and 3 ampoules of bicarb with ROSC.  She was emergently intubated and remains on full ventilator support at this time.  Currently maintaining sinus rhythm with stable BP.  High-sensitivity troponin of 596 this morning currently trending to 2315.  BNP 200.  INR 2.4.  Magnesium improved from 0.95 days ago to 1.8 currently.  Potassium improved from 2.83 days ago to 3.5 currently.  Albumin 5.  AST 979, ALT 164 alkaline phosphatase 666, BUN/SCr 16/1.69.  PCT 1.18.  Echo post ROSC showed an EF of 50-55%, unable to exclude regional wall motion abnormality, grade 1 diastolic dysfunction, normal RV systolic function with mildly enlarged ventricular cavity size, mildly elevated PASP estimated at 36.9 mmHg, mild to moderate mitral regurgitation, moderate tricuspid regurgitation, mild to moderate aortic insufficiency, aortic valve sclerosis without evidence of stenosis, and an estimated right atrial pressure of 8 mmHg.  Patient  has not been initiated on heparin infusion secondary to elevated INR.  Family at bedside.   Past Medical History:  Diagnosis Date   Anxiety    CHF (congestive heart failure)    CKD (chronic kidney disease)    Depression    Fibromyalgia     Hypertension     Past Surgical History:  Procedure Laterality Date   CERVICAL FUSION     COLONOSCOPY N/A 04/02/2023   Procedure: COLONOSCOPY;  Surgeon: Emma Minium, MD;  Location: [facility] ENDOSCOPY;  Service: Endoscopy;  Laterality: N/A;   ESOPHAGOGASTRODUODENOSCOPY (EGD) WITH PROPOFOL N/A 04/22/2023   Procedure: ESOPHAGOGASTRODUODENOSCOPY (EGD) WITH PROPOFOL;  Surgeon: Emma Minium, MD;  Location: [facility] ENDOSCOPY;  Service: Endoscopy;  Laterality: N/A;   RIGHT/LEFT HEART CATH AND CORONARY ANGIOGRAPHY Bilateral 01/26/2023   Procedure: RIGHT/LEFT HEART CATH AND CORONARY ANGIOGRAPHY;  Surgeon: Emma Patty, MD;  Location: [facility] INVASIVE CV LAB;  Service: Cardiovascular;  Laterality: Bilateral;     Home Meds: Prior to Admission medications   Medication Sig Start Date End Date Taking? Authorizing Provider  acetaminophen (TYLENOL) 325 MG tablet Take 2 tablets (650 mg total) by mouth every 6 (six) hours as needed for mild pain or headache (fever >/= 101). 09/05/19  Yes Emma Blood, MD  carvedilol (COREG) 6.25 MG tablet Take 1 tablet (6.25 mg total) by mouth 2 (two) times daily with a meal. 02/24/23  Yes Gould, Aditya, DO  empagliflozin (JARDIANCE) 10 MG TABS tablet Take 1 tablet (10 mg total) by mouth daily before breakfast. 01/25/23  Yes Gould, Emma Buckles, MD  Eszopiclone 3 MG TABS Take 3 mg by mouth at bedtime. 10/07/22  Yes [provider]  ezetimibe (ZETIA) 10 MG tablet Take 10 mg by mouth daily.   Yes [provider]  omeprazole (PRILOSEC) 40 MG capsule Take 40 mg by mouth daily. 06/11/19  Yes [provider]  ondansetron (ZOFRAN) 4 MG tablet Take 4 mg by mouth every 8 (eight) hours as needed for nausea. 03/18/23 04/17/23 Yes [provider]  oxyCODONE-acetaminophen (PERCOCET) 10-325 MG tablet Take 1 tablet by mouth 4 (four) times daily as needed for pain. 10/03/22  Yes [provider]  potassium chloride SA (KLOR-CON M) 20 MEQ tablet Take 2  tablets (40 mEq total) by mouth daily. 02/26/23  Yes Emma Finner, MD  QUEtiapine (SEROQUEL) 25 MG tablet Take 4 tablets (100 mg total) by mouth at bedtime. Patient taking differently: Take 50 mg by mouth 2 (two) times daily. 02/26/23  Yes Emma Finner, MD  sertraline (ZOLOFT) 100 MG tablet Take 100 mg by mouth daily.   Yes [provider]  spironolactone (ALDACTONE) 25 MG tablet Take 12.5 mg by mouth daily.   Yes [provider]  SUMAtriptan (IMITREX) 50 MG tablet Take 50 mg by mouth as needed for migraine. Take a second tablet 2 hours later if needed; max 200mg /day 12/21/18  Yes [provider]  topiramate (TOPAMAX) 100 MG tablet Take 100 mg by mouth 2 (two) times daily.   Yes [provider]  torsemide (DEMADEX) 20 MG tablet TAKE 1 TABLET BY MOUTH EVERY DAY 03/11/23  Yes Gould, Aditya, DO  albuterol (VENTOLIN HFA) 108 (90 Base) MCG/ACT inhaler Inhale 2 puffs into the lungs 4 (four) times daily as needed for shortness of breath. Patient not taking: Reported on 2023-05-04    [provider]    Inpatient Medications: Scheduled Meds:  vitamin C  500 mg Per Tube BID   Chlorhexidine Gluconate Cloth  6  each Topical Daily   [START ON 04/24/2023] cholecalciferol  1,000 Units Per Tube Daily   docusate  100 mg Per Tube BID   ezetimibe  10 mg Oral Daily   folic acid  1 mg Per Tube Daily   insulin aspart  0-15 Units Subcutaneous Q4H   [START ON 04/17/2023] multivitamin with minerals  1 tablet Per Tube Daily   mouth rinse  15 mL Mouth Rinse Q2H   pantoprazole (PROTONIX) IV  40 mg Intravenous Q24H   polyethylene glycol  17 g Per Tube Daily   sertraline  100 mg Oral Daily   sodium chloride flush  10-40 mL Intracatheter Q12H   [START ON 03/28/2023] zinc sulfate  220 mg Per Tube Daily   Continuous Infusions:  sodium chloride 10 mL/hr at 04/15/23 0445   magnesium sulfate bolus IVPB     norepinephrine (LEVOPHED) Adult infusion     propofol (DIPRIVAN) infusion      sodium bicarbonate 150 mEq in sterile water 1,150 mL infusion 75 mL/hr at 04/15/23 1003   PRN Meds: acetaminophen **OR** acetaminophen, fentaNYL (SUBLIMAZE) injection, fentaNYL (SUBLIMAZE) injection, ipratropium-albuterol, midazolam, nitroGLYCERIN, ondansetron **OR** ondansetron (ZOFRAN) IV, mouth rinse, oxyCODONE-acetaminophen, prochlorperazine, sodium chloride, sodium chloride flush, SUMAtriptan  Allergies:  No Known Allergies  Social History:   Social History   Socioeconomic History   Marital status: Widowed    Spouse name: Not on file   Number of children: Not on file   Years of education: Not on file   Highest education level: Not on file  Occupational History   Not on file  Tobacco Use   Smoking status: Every Day    Packs/day: 0.50    Years: 45.00    Additional pack years: 0.00    Total pack years: 22.50    Types: Cigarettes   Smokeless tobacco: Never  Substance and Sexual Activity   Alcohol use: Never   Drug use: Never   Sexual activity: Not on file  Other Topics Concern   Not on file  Social History Narrative   Not on file   Social Determinants of Health   Financial Resource Strain: Not on file  Food Insecurity: No Food Insecurity (10-Apr-2023)   Hunger Vital Sign    Worried About Running Out of Food in the Last Year: Never true    Ran Out of Food in the Last Year: Never true  Transportation Needs: No Transportation Needs (Apr 10, 2023)   PRAPARE - Administrator, Civil Service (Medical): No    Lack of Transportation (Non-Medical): No  Physical Activity: Not on file  Stress: Not on file  Social Connections: Not on file  Intimate Partner Violence: Not At Risk (2023/04/10)   Humiliation, Afraid, Rape, and Kick questionnaire    Fear of Current or Ex-Partner: No    Emotionally Abused: No    Physically Abused: No    Sexually Abused: No     Family History:   Family History  Problem Relation Age of Onset   Stroke Mother    Hypertension Mother     Lung cancer Father        Father died of lung cancer    ROS:  Review of Systems  Unable to perform ROS: Intubated      Physical Exam/Data:   Vitals:   04/15/23 0730 04/15/23 0743 04/15/23 0745 04/15/23 0800  BP: 108/76 97/65 97/65  93/70  Pulse: (!) 27 (!) 29 (!) 29 (!) 29  Resp: (!) 23 (!) 28 (!) 26 Marland Kitchen)  24  Temp:    (!) 97.3 F (36.3 C)  TempSrc:    Esophageal  SpO2: 93% 94% 95% 96%  Weight:      Height:        Intake/Output Summary (Last 24 hours) at 04/15/2023 1317 Last data filed at 04/15/2023 0645 Gross per 24 hour  Intake 582.32 ml  Output 120 ml  Net 462.32 ml   Filed Weights   04/20/2023 0430 04/13/23 0500 04/14/23 0500  Weight: 63.9 kg 71 kg 70 kg   Body mass index is 27.34 kg/m.   Physical Exam: General: Critically ill-appearing, in no acute distress. Head: Normocephalic, atraumatic, sclera non-icteric, no xanthomas, nares without discharge.  Neck: Negative for carotid bruits. JVD unable to be assessed secondary to respiratory support apparatus. Lungs: Vented breath sounds bilaterally. Heart: RRR with S1 S2. No murmurs, rubs, or gallops appreciated. Abdomen: Soft, non-tender, non-distended with normoactive bowel sounds. No hepatomegaly. No rebound/guarding. No obvious abdominal masses. Msk:  Strength and tone appear normal for age. Extremities: No clubbing or cyanosis.  Mild bilateral lower extremity edema.  Neuro: Intubated. Psych:  Intubated.   EKG:  The EKG was personally reviewed and demonstrates: 04/13/2023 - NSR, 99 bpm, low voltage QRS, nonspecific ST-T changes.  04/15/2023 - sinus tachycardia, 115 bpm, low voltage QRS, nonspecific ST-T changes Telemetry:  Telemetry was personally reviewed and demonstrates: Sinus rhythm with sinus bradycardia in the 50s transitioning to junctional rhythm of 27 bpm followed by [facility] with intermittent bradycardia  Weights: Filed Weights   20-Apr-2023 0430 04/13/23 0500 04/14/23 0500  Weight: 63.9 kg 71 kg 70 kg     Relevant CV Studies:  2D echo 04/15/2023: 1. Left ventricular ejection fraction, by estimation, is 50 to 55%. The  left ventricle has low normal function. Challenging images, unable to  exclude regional wall motion abnormality. Left ventricular diastolic  parameters are consistent with Grade I  diastolic dysfunction (impaired relaxation).   2. Right ventricular systolic function is normal. The right ventricular  size is mildly enlarged. There is mildly elevated pulmonary artery  systolic pressure. The estimated right ventricular systolic pressure is  36.9 mmHg.   3. The mitral valve is normal in structure. Mild to moderate mitral valve  regurgitation. No evidence of mitral stenosis.   4. Tricuspid valve regurgitation is moderate.   5. The aortic valve has an indeterminant number of cusps. Aortic valve  regurgitation is mild to moderate. Aortic valve sclerosis is present, with  no evidence of aortic valve stenosis.   6. The inferior vena cava is normal in size with <50% respiratory  variability, suggesting right atrial pressure of 8 mmHg. __________  [facility] 01/26/2023:   Ost Cx to Prox Cx lesion is 30% stenosed.   The left ventricular ejection fraction is 55-65% by visual estimate.   Findings:   Ao = 144/74 (104) LV = 148/18 RA =  14 RV = 38/14 PA = 35/17 (24) PCW = 19 Fick cardiac output/index = 5.0/2.9 PVR = 1.0 FA sat = 95% PA sat = 64%, 66% PAPi = 1.3   Assessment: 1. 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 LVEF 60-65% with mildly elevated LVEDP 3. 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 Normal cardiac output   Plan/Discussion:   Overall R> L heart failure.    Increase torsemide to 20 mg qod alternating with 40mg  qod. W/u for Group III PH (COPD, OSA)  __________  2D echo 12/10/2022: 1. Left ventricular ejection  fraction, by estimation, is 60 to 65%. The  left ventricle has normal  function. The left ventricle has no regional  wall motion abnormalities. Left ventricular diastolic parameters are  indeterminate. The average left  ventricular global longitudinal strain is -15.8 %.   2. Right ventricular systolic function is normal. The right ventricular  size is normal. There is normal pulmonary artery systolic pressure. The  estimated right ventricular systolic pressure is 32.5 mmHg.   3. The mitral valve is normal in structure. Moderate mitral valve  regurgitation. No evidence of mitral stenosis.   4. Tricuspid valve regurgitation is mild to moderate.   5. The aortic valve is tricuspid. Aortic valve regurgitation is mild. No  aortic stenosis is present.   6. The inferior vena cava is normal in size with greater than 50%  respiratory variability, suggesting right atrial pressure of 3 mmHg.     Laboratory Data:  Chemistry Recent Labs  Lab 04/14/23 0500 04/14/23 1657 04/15/23 0725  NA 139 137 145  K 3.4* 3.4* 3.5  CL 113* 113* 113*  CO2 17* 14* 17*  GLUCOSE 88 154* 120*  BUN CREATININE 1.39* 1.48* 1.69*  CALCIUM 7.9* 7.7* 9.2  GFRNONAA 42* 39* 34*  ANIONGAP Recent Labs  Lab 04/14/23 0500 04/15/23 0725  PROT 4.2* 4.0*  ALBUMIN <1.5* 1.5*  AST 48* 979*  ALT 27 164*  ALKPHOS 564* 666*  BILITOT 0.7 0.8   Hematology Recent Labs  Lab 04/14/23 0500 04/15/23 0725 04/15/23 1045  WBC 16.6* 16.2* 16.5*  RBC 3.20* 3.06* 3.03*  HGB 10.8* 10.3* 10.3*  HCT 31.0* 30.8* 30.6*  MCV 96.9 100.7* 101.0*  MCH 33.8 33.7 34.0  MCHC 34.8 33.4 33.7  RDW 15.1 16.3* 16.3*  PLT 96* 52* 43*   Cardiac EnzymesNo results for input(s): "TROPONINI" in the last 168 hours. No results for input(s): "TROPIPOC" in the last 168 hours.  BNP Recent Labs  Lab 04/13/23 1816 04/14/23 1657 04/15/23 0900  BNP 223.4* 208.1* 200.7*    DDimer  Recent Labs  Lab 04/13/23 1842  DDIMER 0.36    Radiology/Studies:   DG Chest Port 1 View  Result Date:  04/15/2023 IMPRESSION: 1. ETT tip 3.8 cm from the carina. 2. Standard NGT tip abuts the proximal body of the stomach but the side hole is in the distal thoracic esophagus and should be advanced further in. 3. Stable cardiomegaly and diffuse airspace disease with underlying interstitial prominence. 4. Loss of the haustral fold pattern in the transverse colon compatible with wall edema/colitis. 5. Aortic atherosclerosis. Electronically Signed   By: Almira Bar M.D.   On: 04/15/2023 07:06   DG Abd 1 View  Result Date: 04/15/2023 IMPRESSION: 1. ETT tip 3.8 cm from the carina. 2. Standard NGT tip abuts the proximal body of the stomach but the side hole is in the distal thoracic esophagus and should be advanced further in. 3. Stable cardiomegaly and diffuse airspace disease with underlying interstitial prominence. 4. Loss of the haustral fold pattern in the transverse colon compatible with wall edema/colitis. 5. Aortic atherosclerosis. Electronically Signed   By: Almira Bar M.D.   On: 04/15/2023 07:06   DG Chest Port 1 View  Result Date: 04/14/2023 IMPRESSION: Extensive diffuse alveolar opacity with interval worsening. Electronically Signed   By: Layla Maw M.D.   On: 04/14/2023 16:57   DG Chest 1 View  Result Date: 04/13/2023 IMPRESSION: Probable interstitial edema versus developing infiltrate. Electronically  Signed   By: Elgie Collard M.D.   On: 04/13/2023 20:11    Assessment and Plan:   1.  Cardiopulmonary arrest with HFpEF and moderate hypertension complicated by multiorgan failure: -Status post 8 to 9 minutes of CPR with ROSC and emergent intubation -Concern for primary respiratory component in the setting of severe hypoxia as patient had removed supplemental oxygen and with potential volume overload noted on imaging -Concern for possible anoxic brain injury -Requiring full ventilator support, management per critical care -High-sensitivity troponin has trended to greater than  2300, not currently a heparin drip candidate secondary to INR 2.4, thrombocytopenia, and abnormal LFT -No indication for emergent LHC given cardiac cath less than 3 months ago showed minimal nonobstructive disease involving the LCx system -Suspect elevated troponin is in the setting of supply/demand ischemia secondary to the above -Echo with continued preserved LV systolic function -Supportive care with IV diuresis as tolerated augmented with IV albumin given significant hypoalbuminemia -Not currently requiring vasopressor support, maintain MAP greater than 65 -CTA chest to evaluate for PE pending once hemodynamically stable -Cannot exclude degree of congestive hepatopathy versus shock liver given prior noted RV dysfunction and evidence of volume overload on imaging -Prognosis guarded -Further evaluation pending meaningful neurologic recovery          For questions or updates, please contact [facility] Please consult [email or web address] for contact info under Cardiology/STEMI.   Signed, Eula Listen, PA-C [facility] Pager: [phone] 04/15/2023, 1:17 PM

## 2023-04-15 NOTE — IPAL (Signed)
  Interdisciplinary Goals of Care Family Meeting   Date carried out: 04/15/2023  Location of the meeting: Bedside  Member's involved: Nurse Practitioner, Bedside Registered Nurse, and Family Member or next of kin  Durable Power of Attorney or acting medical decision maker: Pt's mother     Discussion: We discussed goals of care for Emma Gould .  Discussed current critical illness s/p Cardiac arrest earlier this morning, now with subsequent multiorgan failure and concern for possible anoxic brain injury as she remains unresponsive with dilated and fixed pupils despite NO sedation.  Discussed desire to obtain CT Head, and CTA chest to rule out PE, along with CT Abdomen, however pt is too unstable at this time to send down for imaging due to high FiO2/PEEP requirements and vasopressor requirements (pt desaturated to the low to mid 80's just with slight positioning for Echo at bedside).  Discussed that typically have to wait 48-72 hrs for neuro prognostication.  She understands that pt is critically ill and could arrest again at anytime.  She does state that the pt WOULD NOT WANT HEMODIALYSIS if it were indicated.  She also feels that if the pt were able to tell us, she would likely NOT want to be maintained artificially on machines.  Mother would like to continue current measures for now and see how things progress.  In regards to Code Status, she is considering DNR status but not ready to make that decision just yet, and that she believes that the pt's significant other Emma Gould actually has medical power of attorney, however he currently too distraught with situation to be at bedside currently.  Code status:   Code Status: Full Code   Disposition: Continue current acute care  Time spent for the meeting: 20 minutes    Harlon Ditty, AGACNP-BC [facility] Prefer epic messenger for cross cover needs If after hours, please call E-link  Judithe Modest, NP  04/15/2023,  1:50 PM

## 2023-04-15 NOTE — Progress Notes (Signed)
Patient's CVP reading is 4; NP notified and ordered LR bolus.

## 2023-04-15 NOTE — TOC Progression Note (Signed)
Transition of Care [facility]) - Progression Note    Patient Details  Name: Emma Gould MRN: [id number] Date of Birth: [birth year]  [facility] CM/SW Contact  Darolyn Rua, [place] Phone Number: 04/15/2023, 11:46 AM  Clinical Narrative:     CSW notes patient active with [facility] PT.   Patient recently transitioned to ICU 4/18, emergently intubated 4/19 following removal of oxygen and unresponsiveness.   TOC will continue to follow for GOC and appropriate dispo pending medical improvements.        Expected Discharge Plan and Services                                               Social Determinants of Health (SDOH) Interventions SDOH Screenings   Food Insecurity: No Food Insecurity (04/14/2023)  Housing: Low Risk  (04/04/2023)  Transportation Needs: No Transportation Needs (04/06/2023)  Utilities: Not At Risk (04/08/2023)  Tobacco Use: High Risk (04/13/2023)    Readmission Risk Interventions    04/13/2023    2:27 PM  Readmission Risk Prevention Plan  Transportation Screening Complete  HRI or Home Care Consult Complete  Palliative Care Screening Not Applicable  Skilled Nursing Facility Not Applicable

## 2023-04-15 NOTE — Progress Notes (Signed)
[facility]  ROUNDING NOTE   Subjective:   Per chart review, patient became hypoxic and was transferred to stepdown for closer monitoring. Patient seen and evaluated at bedside in ICU Patient now intubated on 100% FiO2 Sedated with pressors, levo. Sodium bicarb infusion in place Recently placed A-line  Potassium 3.5 Creatinine 1.69  Objective:  Vital signs in last 24 hours:  Temp:  [96.6 F (35.9 C)-100.3 F (37.9 C)] 96.6 F (35.9 C) (04/19 1000) Pulse Rate:  [25-125] 31 (04/19 1000) Resp:  [6-55] 30 (04/19 1000) BP: (90-128)/(41-98) 93/70 (04/19 0800) SpO2:  [74 %-100 %] 74 % (04/19 1000) Arterial Line BP: (129)/(58) 129/58 (04/19 1000) FiO2 (%):  [96 %-100 %] 100 % (04/19 1000)  Weight change:  Filed Weights   2023-04-20 0430 04/13/23 0500 04/14/23 0500  Weight: 63.9 kg 71 kg 70 kg    Intake/Output: I/O last 3 completed shifts: In: 1062.3 [P.O.:580; I.V.:82.4; IV Piggyback:400] Out: 120 [Urine:110; Emesis/NG output:10]   Intake/Output this shift:  No intake/output data recorded.  Physical Exam: General: Ill-appearing  Head: Normocephalic, atraumatic.   Eyes: Anicteric  Lungs:  Intubated on vent  Heart: Regular rate and rhythm  Abdomen:  Soft, nontender  Extremities:  No peripheral edema.  Neurologic: Sedated  Skin: No lesions  Access: None    Basic Metabolic Panel: Recent Labs  Lab 04/24/2023 0904 04/14/2023 1236 04/10/23 0447 04/11/23 0055 04/11/23 0525 04/11/23 0830 2023-04-20 0358 20-Apr-2023 0800 04/20/23 0847 04/13/23 0529 04/14/23 0500 04/14/23 1657 04/15/23 0725  NA 140  --  137  --  136  --  137  --   --  136 139 137 145  K 2.7*   < > 2.5*   < > 3.7   < > 2.8* 3.3*  --  3.3* 3.4* 3.4* 3.5  CL 98  --  103  --  107  --  110  --   --  113* 113* 113* 113*  CO2 27  --  23  --  21*  --  17*  --   --  15* 17* 14* 17*  GLUCOSE 87  --  67*  --  77  --  102*  --   --  76 88 154* 120*  BUN 31*  --  28*  --  19  --  15  --   --  13 15 14  16   CREATININE 2.84*  --  2.49*  --  1.73*  --  1.46*  --   --  1.27* 1.39* 1.48* 1.69*  CALCIUM 7.4*  --  6.9*  --  7.0*  --  6.9*  --   --  7.4* 7.9* 7.7* 9.2  MG 1.0*  --  0.9*  --  2.9*  --   --   --  1.8 1.9 1.8  --  1.8  PHOS 3.5  --  3.4  --  2.3*  --   --   --   --   --  2.8  --  6.0*   < > = values in this interval not displayed.     Liver Function Tests: Recent Labs  Lab 04/14/23 0500 04/15/23 0725  AST 48* 979*  ALT 27 164*  ALKPHOS 564* 666*  BILITOT 0.7 0.8  PROT 4.2* 4.0*  ALBUMIN <1.5* 1.5*    Recent Labs  Lab 04/15/23 0900  LIPASE 14    Recent Labs  Lab 04/14/23 1333  AMMONIA 55*    CBC: Recent Labs  Lab 04/10/23 0447 04/11/23 0525 2023-04-23 0358 04/13/23 0529 04/14/23 0500 04/15/23 0725 04/15/23 1045  WBC 12.0* 14.0* 14.5* 18.2* 16.6* 16.2* 16.5*  NEUTROABS 6.3 9.0* 10.1* 13.8*  --   --  12.8*  HGB 12.2 11.5* 10.8* 11.6* 10.8* 10.3* 10.3*  HCT 36.4 34.2* 31.8* 33.0* 31.0* 30.8* 30.6*  MCV 99.5 99.4 99.7 97.6 96.9 100.7* 101.0*  PLT 114* 116* 94* 103* 96* 52* 43*     Cardiac Enzymes: No results for input(s): "CKTOTAL", "CKMB", "CKMBINDEX", "TROPONINI" in the last 168 hours.  BNP: Invalid input(s): "POCBNP"  CBG: Recent Labs  Lab 04/14/23 1146 04/14/23 1637 04/14/23 2129 04/15/23 0701 04/15/23 1255  GLUCAP 99 150* 121* 139* 110*     Microbiology: Results for orders placed or performed during the hospital encounter of 04/09/2023  Gastrointestinal Panel by PCR , Stool     Status: None   Collection Time: 04/08/23 11:40 AM   Specimen: Stool  Result Value Ref Range Status   Campylobacter species NOT DETECTED NOT DETECTED Final   Plesimonas shigelloides NOT DETECTED NOT DETECTED Final   Salmonella species NOT DETECTED NOT DETECTED Final   Yersinia enterocolitica NOT DETECTED NOT DETECTED Final   Vibrio species NOT DETECTED NOT DETECTED Final   Vibrio cholerae NOT DETECTED NOT DETECTED Final   Enteroaggregative E coli (EAEC) NOT  DETECTED NOT DETECTED Final   Enteropathogenic E coli (EPEC) NOT DETECTED NOT DETECTED Final   Enterotoxigenic E coli (ETEC) NOT DETECTED NOT DETECTED Final   Shiga like toxin producing E coli (STEC) NOT DETECTED NOT DETECTED Final   Shigella/Enteroinvasive E coli (EIEC) NOT DETECTED NOT DETECTED Final   Cryptosporidium NOT DETECTED NOT DETECTED Final   Cyclospora cayetanensis NOT DETECTED NOT DETECTED Final   Entamoeba histolytica NOT DETECTED NOT DETECTED Final   Giardia lamblia NOT DETECTED NOT DETECTED Final   Adenovirus F40/41 NOT DETECTED NOT DETECTED Final   Astrovirus NOT DETECTED NOT DETECTED Final   Norovirus GI/GII NOT DETECTED NOT DETECTED Final   Rotavirus A NOT DETECTED NOT DETECTED Final   Sapovirus (I, II, IV, and V) NOT DETECTED NOT DETECTED Final    Comment: Performed at [facility], [address] [place], [place] [address]  C Difficile Quick Screen w PCR reflex     Status: None   Collection Time: 04/08/23 11:40 AM   Specimen: STOOL  Result Value Ref Range Status   C Diff antigen NEGATIVE NEGATIVE Final   C Diff toxin NEGATIVE NEGATIVE Final   C Diff interpretation No C. difficile detected.  Final    Comment: Performed at [facility], [address] [place], [place] [address]  MRSA Next Gen by PCR, Nasal     Status: None   Collection Time: 04/14/23  4:39 PM   Specimen: Nasal Mucosa; Nasal Swab  Result Value Ref Range Status   MRSA by PCR Next Gen NOT DETECTED NOT DETECTED Final    Comment: (NOTE) The GeneXpert MRSA Assay (FDA approved for NASAL specimens only), is one component of a comprehensive MRSA colonization surveillance program. It is not intended to diagnose MRSA infection nor to guide or monitor treatment for MRSA infections. Test performance is not FDA approved in patients less than 58 years old. Performed at [facility], [address] [place], [place] [address]     Coagulation Studies: Recent Labs     04/15/23 0725  LABPROT 25.7*  INR 2.4*    Urinalysis: No results for input(s): "COLORURINE", "LABSPEC", "PHURINE", "GLUCOSEU", "HGBUR", "BILIRUBINUR", "  KETONESUR", "PROTEINUR", "UROBILINOGEN", "NITRITE", "LEUKOCYTESUR" in the last 72 hours.  Invalid input(s): "APPERANCEUR"    Imaging: ECHOCARDIOGRAM COMPLETE  Result Date: 04/15/2023    ECHOCARDIOGRAM REPORT   Patient Name:   Emma Gould Date of Exam: 04/15/2023 Medical Rec #:  [id number]     Height:       63.0 in Accession #:    [id number]    Weight:       154.3 lb Date of Birth:  24-Jun-1956      BSA:          1.732 m Patient Age:    66 years      BP:           97/65 mmHg Patient Gender: F             HR:           104 bpm. Exam Location:  [facility] Procedure: 2D Echo, Cardiac Doppler, Color Doppler and Intracardiac            Opacification Agent Indications:     CHF  History:         Patient has prior history of Echocardiogram examinations, most                  recent 12/10/2022. CHF, CAD and Acute MI; Risk                  Factors:Hypertension, Dyslipidemia and Current Smoker.  Sonographer:     Mikki Harbor Referring Phys:  [id number] KELLY A GRIFFITH Diagnosing Phys: Julien Nordmann MD  Sonographer Comments: Technically difficult study due to poor echo windows and echo performed with patient supine and on artificial respirator. IMPRESSIONS  1. Left ventricular ejection fraction, by estimation, is 50 to 55%. The left ventricle has low normal function. Challenging images, unable to exclude regional wall motion abnormality. Left ventricular diastolic parameters are consistent with Grade I diastolic dysfunction (impaired relaxation).  2. Right ventricular systolic function is normal. The right ventricular size is mildly enlarged. There is mildly elevated pulmonary artery systolic pressure. The estimated right ventricular systolic pressure is 36.9 mmHg.  3. The mitral valve is normal in structure. Mild to moderate mitral valve regurgitation. No evidence of  mitral stenosis.  4. Tricuspid valve regurgitation is moderate.  5. The aortic valve has an indeterminant number of cusps. Aortic valve regurgitation is mild to moderate. Aortic valve sclerosis is present, with no evidence of aortic valve stenosis.  6. The inferior vena cava is normal in size with <50% respiratory variability, suggesting right atrial pressure of 8 mmHg. FINDINGS  Left Ventricle: Left ventricular ejection fraction, by estimation, is 50 to 55%. The left ventricle has low normal function. The left ventricle has no regional wall motion abnormalities. Definity contrast agent was given IV to delineate the left ventricular endocardial borders. The left ventricular internal cavity size was normal in size. There is no left ventricular hypertrophy. Left ventricular diastolic parameters are consistent with Grade I diastolic dysfunction (impaired relaxation). Right Ventricle: The right ventricular size is mildly enlarged. No increase in right ventricular wall thickness. Right ventricular systolic function is normal. There is mildly elevated pulmonary artery systolic pressure. The tricuspid regurgitant velocity is 2.69 m/s, and with an assumed right atrial pressure of 8 mmHg, the estimated right ventricular systolic pressure is 36.9 mmHg. Left Atrium: Left atrial size was normal in size. Right Atrium: Right atrial size was normal in size. Pericardium: There is no evidence of pericardial effusion. Mitral Valve: The  mitral valve is normal in structure. Mild to moderate mitral valve regurgitation. No evidence of mitral valve stenosis. MV peak gradient, 2.5 mmHg. The mean mitral valve gradient is 1.0 mmHg. Tricuspid Valve: The tricuspid valve is normal in structure. Tricuspid valve regurgitation is moderate . No evidence of tricuspid stenosis. Aortic Valve: The aortic valve has an indeterminant number of cusps. Aortic valve regurgitation is mild to moderate. Aortic valve sclerosis is present, with no evidence of  aortic valve stenosis. Aortic valve mean gradient measures 3.0 mmHg. Aortic valve peak gradient measures 7.0 mmHg. Aortic valve area, by VTI measures 2.90 cm. Pulmonic Valve: The pulmonic valve was normal in structure. Pulmonic valve regurgitation is not visualized. No evidence of pulmonic stenosis. Aorta: The aortic root is normal in size and structure. Venous: The inferior vena cava is normal in size with less than 50% respiratory variability, suggesting right atrial pressure of 8 mmHg. IAS/Shunts: No atrial level shunt detected by color flow Doppler.  LEFT VENTRICLE PLAX 2D LVIDd:         4.20 cm   Diastology LVIDs:         3.20 cm   LV e' medial:    6.09 cm/s LV PW:         0.90 cm   LV E/e' medial:  7.1 LV IVS:        0.90 cm   LV e' lateral:   10.30 cm/s LVOT diam:     2.00 cm   LV E/e' lateral: 4.2 LV SV:         64 LV SV Index:   37 LVOT Area:     3.14 cm  RIGHT VENTRICLE RV Basal diam:  2.75 cm RV Mid diam:    2.50 cm RV S prime:     15.00 cm/s TAPSE (M-mode): 1.7 cm LEFT ATRIUM           Index        RIGHT ATRIUM           Index LA diam:      3.50 cm 2.02 cm/m   RA Area:     11.60 cm LA Vol (A2C): 31.9 ml 18.42 ml/m  RA Volume:   25.60 ml  14.78 ml/m LA Vol (A4C): 15.6 ml 9.01 ml/m  AORTIC VALVE                    PULMONIC VALVE AV Area (Vmax):    2.74 cm     PV Vmax:       0.86 m/s AV Area (Vmean):   2.60 cm     PV Peak grad:  3.0 mmHg AV Area (VTI):     2.90 cm AV Vmax:           132.00 cm/s AV Vmean:          78.200 cm/s AV VTI:            0.221 m AV Peak Grad:      7.0 mmHg AV Mean Grad:      3.0 mmHg LVOT Vmax:         115.00 cm/s LVOT Vmean:        64.700 cm/s LVOT VTI:          0.204 m LVOT/AV VTI ratio: 0.92  AORTA Ao Root diam: 2.65 cm Ao Asc diam:  2.80 cm MITRAL VALVE               TRICUSPID VALVE MV Area (PHT):  4.46 cm    TR Peak grad:   28.9 mmHg MV Area VTI:   4.16 cm    TR Vmax:        269.00 cm/s MV Peak grad:  2.5 mmHg MV Mean grad:  1.0 mmHg    SHUNTS MV Vmax:       0.79 m/s     Systemic VTI:  0.20 m MV Vmean:      52.8 cm/s   Systemic Diam: 2.00 cm MV Decel Time: 170 msec MV E velocity: 43.00 cm/s MV A velocity: 79.80 cm/s MV E/A ratio:  0.54 Julien Nordmann MD Electronically signed by Julien Nordmann MD Signature Date/Time: 04/15/2023/1:17:19 PM    Final    DG Chest Port 1 View  Result Date: 04/15/2023 CLINICAL DATA:  [id number]. Encounter for orogastric tube placement. Check ETT. EXAM: PORTABLE CHEST 1 VIEW PORTABLE ABDOMEN 1 VIEW COMPARISON:  Portable chest yesterday at 4:45 p.m., CT abdomen and pelvis 04/22/2023 FINDINGS: Chest AP portable at 6:39 a.m.: ETT tip is 3.8 cm from the carina. Right PICC tip remains at the superior cavoatrial junction. There is overlying monitor wiring. There is new demonstration of overlying electrical pads. Stable cardiomediastinal configuration. There is diffuse dense airspace disease throughout the lungs with continued underlying interstitial prominence, minimal pleural effusions. No new or worsening opacity is seen.  There is thoracic spondylosis. AP portable abdomen at 6:43 a.m.: Standard NGT is in place. The tip abuts the proximal body of stomach but the side hole is in the distal thoracic esophagus and should be advanced further in. The bowel pattern is nonobstructive. Again noted is loss of the haustral fold pattern in the transverse colon compatible with wall edema/colitis. The bowel pattern is nonobstructive. No pneumatosis or supine evidence of free air. There are calcifications of the abdominal aorta and common iliac arteries. No nephrolithiasis. IMPRESSION: 1. ETT tip 3.8 cm from the carina. 2. Standard NGT tip abuts the proximal body of the stomach but the side hole is in the distal thoracic esophagus and should be advanced further in. 3. Stable cardiomegaly and diffuse airspace disease with underlying interstitial prominence. 4. Loss of the haustral fold pattern in the transverse colon compatible with wall edema/colitis. 5. Aortic  atherosclerosis. Electronically Signed   By: Almira Bar M.D.   On: 04/15/2023 07:06   DG Abd 1 View  Result Date: 04/15/2023 CLINICAL DATA:  [id number]. Encounter for orogastric tube placement. Check ETT. EXAM: PORTABLE CHEST 1 VIEW PORTABLE ABDOMEN 1 VIEW COMPARISON:  Portable chest yesterday at 4:45 p.m., CT abdomen and pelvis 04/03/2023 FINDINGS: Chest AP portable at 6:39 a.m.: ETT tip is 3.8 cm from the carina. Right PICC tip remains at the superior cavoatrial junction. There is overlying monitor wiring. There is new demonstration of overlying electrical pads. Stable cardiomediastinal configuration. There is diffuse dense airspace disease throughout the lungs with continued underlying interstitial prominence, minimal pleural effusions. No new or worsening opacity is seen.  There is thoracic spondylosis. AP portable abdomen at 6:43 a.m.: Standard NGT is in place. The tip abuts the proximal body of stomach but the side hole is in the distal thoracic esophagus and should be advanced further in. The bowel pattern is nonobstructive. Again noted is loss of the haustral fold pattern in the transverse colon compatible with wall edema/colitis. The bowel pattern is nonobstructive. No pneumatosis or supine evidence of free air. There are calcifications of the abdominal aorta and common iliac arteries. No nephrolithiasis. IMPRESSION: 1. ETT tip 3.8 cm from the  carina. 2. Standard NGT tip abuts the proximal body of the stomach but the side hole is in the distal thoracic esophagus and should be advanced further in. 3. Stable cardiomegaly and diffuse airspace disease with underlying interstitial prominence. 4. Loss of the haustral fold pattern in the transverse colon compatible with wall edema/colitis. 5. Aortic atherosclerosis. Electronically Signed   By: Almira Bar M.D.   On: 04/15/2023 07:06   DG Chest Port 1 View  Result Date: 04/14/2023 CLINICAL DATA:  Respiratory distress EXAM: PORTABLE CHEST 1 VIEW  COMPARISON:  04/13/2023 FINDINGS: Diffuse alveolar process identified consistent with extensive pneumonia or pulmonary edema. No pneumothorax identified. No definite pleural effusion is seen. Right-sided PICC tip at the RA SVC junction. Calcified aorta. IMPRESSION: Extensive diffuse alveolar opacity with interval worsening. Electronically Signed   By: Layla Maw M.D.   On: 04/14/2023 16:57   DG Chest 1 View  Result Date: 04/13/2023 CLINICAL DATA:  Hypoxia. EXAM: CHEST  1 VIEW COMPARISON:  Chest radiograph dated 11/22/2022. FINDINGS: Faint diffuse interstitial and hazy airspace density primarily involving the mid to lower lung field may represent combination of atelectasis and edema. Pneumonia is not excluded. Clinical correlation is recommended. A small left pleural effusion suspected. No pneumothorax. The cardiac silhouette is within normal limits. Atherosclerotic calcification of the aortic arch. No acute osseous pathology. Cervical spine ACDF. IMPRESSION: Probable interstitial edema versus developing infiltrate. Electronically Signed   By: Elgie Collard M.D.   On: 04/13/2023 20:11     Medications:    sodium chloride 10 mL/hr at 04/15/23 0445   magnesium sulfate bolus IVPB 2 g (04/15/23 1329)   norepinephrine (LEVOPHED) Adult infusion 8 mcg/min (04/15/23 1322)   propofol (DIPRIVAN) infusion     sodium bicarbonate 150 mEq in sterile water 1,150 mL infusion 75 mL/hr at 04/15/23 1003    vitamin C  500 mg Per Tube BID   Chlorhexidine Gluconate Cloth  6 each Topical Daily   [START ON 04/01/2023] cholecalciferol  1,000 Units Per Tube Daily   docusate  100 mg Per Tube BID   ezetimibe  10 mg Oral Daily   folic acid  1 mg Per Tube Daily   insulin aspart  0-15 Units Subcutaneous Q4H   [START ON 04/15/2023] multivitamin with minerals  1 tablet Per Tube Daily   mouth rinse  15 mL Mouth Rinse Q2H   pantoprazole (PROTONIX) IV  40 mg Intravenous Q24H   polyethylene glycol  17 g Per Tube Daily    sertraline  100 mg Oral Daily   sodium chloride flush  10-40 mL Intracatheter Q12H   [START ON 04/18/2023] zinc sulfate  220 mg Per Tube Daily   acetaminophen **OR** acetaminophen, fentaNYL (SUBLIMAZE) injection, fentaNYL (SUBLIMAZE) injection, ipratropium-albuterol, midazolam, nitroGLYCERIN, ondansetron **OR** ondansetron (ZOFRAN) IV, mouth rinse, oxyCODONE-acetaminophen, prochlorperazine, sodium chloride, sodium chloride flush, SUMAtriptan  Assessment/ Plan:  Emma Gould is a 67 y.o.  female  with past medical history consistent with hypertension, nonobstructive CAD, diastolic heart failure, and chronic kidney disease,, who was admitted to [facility] on 04/09/2023 for Colitis [K52.9] Acute colitis [K52.9] AKI (acute kidney injury) [N17.9]   Acute kidney injury on chronic kidney disease stage IIIa. Baseline creatinine appears to be 1.21 with GFR 50 on 02/26/2023. Acute kidney injury secondary to hypovolemia due to persistent diarrhea. No IV contrast exposure. Creatinine 3.17 on ED admission. Creatinine slightly elevated today likely due to recent events and hypoxia. Will continue to monitor renal function during this admission.   Lab Results  Component Value Date   CREATININE 1.69 (H) 04/15/2023   CREATININE 1.48 (H) 04/14/2023   CREATININE 1.39 (H) 04/14/2023    Intake/Output Summary (Last 24 hours) at 04/15/2023 1336 Last data filed at 04/15/2023 0645 Gross per 24 hour  Intake 582.32 ml  Output 120 ml  Net 462.32 ml    2.  Severe hypokalemia/hypomagnesia.  Likely due to persistent diarrhea.  Potassium and magnesium corrected with supplementation.   3.  Hypertension with chronic kidney disease.  Now hypotensive requiring Levophed.   4.  Chronic diarrhea, etiology unknown.  C. difficile negative.  GI panel negative.  GI completed EGD and colonoscopy on 03/31/2023. EGD showed some gastritic while colonoscopy showed multiple polyps, severe colonic inflammation and internal hemorrhoids.      LOS: 8   4/19/20241:36 PM

## 2023-04-15 NOTE — Progress Notes (Addendum)
Nutrition Follow Up Note  DOCUMENTATION CODES:   Not applicable  INTERVENTION:   Once appropriate for tube feeds, recommend:  Vital 1.2@55ml /hr- Initiate at 26ml/hr and increase by 30ml/hr q 8 hours until goal rate is reached.   Free water flushes 30ml q4 hours to maintain tube patency   Regimen provides 1584kcal/day, 99g/day protein and 1286ml/day of free water  Pt at high refeed risk; recommend monitor potassium, magnesium and phosphorus labs daily until stable  Daily weights   Vitamin C  BID via tube  Cholecalciferol 1000 units daily via tube   Folic acid  daily via tube   Zinc  daily via tube   Vitamins B3, E and A pending   NUTRITION DIAGNOSIS:   Inadequate oral intake related to acute illness as evidenced by per patient/family report. -ongoing   GOAL:   Provide needs based on ASPEN/SCCM guidelines -not met   MONITOR:   Vent status, Labs, Weight trends, I & O's, Skin  ASSESSMENT:   67 y/o female with h/o depression, anxiety, CKD III, CAD, CHF, HTN, GERD, ischemic colitis, CVA, DDD, hiatal hernia and chronic diarrhea who is admitted with pancolitis and frequent falls. Pt s/p PEA arrest 4/19 complicated by AKI and shock liver.  Pt s/p cardiac arrest early this morning. Pt currently sedated and ventilated. OGT in place but needs advancement; NP and RN aware. Pt with poor appetite and oral intake since admission; pt eating <25% of meals. Per chart, pt is up ~13lbs since admission. Pt with increased output from her OGT today; plan is to hold tube feeds and reassess tomorrow. Pt is at high refeed risk. Pt with numerous vitamin deficiencies noted; pt is being provided supplementation.   Medications reviewed and include: vitamin C, vitamin D, colace, pepcid, folic acid, insulin, creon, MVI, miralax, zinc, Mg sulfate, levophed, Na bicarbonate   Labs reviewed: Na 145 wnl, K 3.5 wnl, creat 1.69(H), P 6.0(H), Mg 1.8 wnl Ammonia- 55(H)- 4/18 BNP-  200.7(H) Folate 4.7(L), vitamin D 32.25 wnl, vitamin C <0.1(L), zinc 40(L)- 4/13 Wbc- 16.2(H), Hgb 10.3(L), Hct 30.8(L) Cbgs- 139, 121, 150, 99, 131 x 24 hrs   Patient is currently intubated on ventilator support MV: 9.3 L/min Temp (24hrs), Avg:98.3 F (36.8 C), Min:96.6 F (35.9 C), Max:100.3 F (37.9 C)  MAP- >63mmHg   UOP-   Diet Order:   Diet Order             Diet NPO time specified  Diet effective now                  EDUCATION NEEDS:   Education needs have been addressed  Skin:  Skin Assessment: Reviewed RN Assessment (ecchymosis)  Last BM:  4/18- type 6  Height:   Ht Readings from Last 1 Encounters:  04/14/23  (1.6 m)    Weight:   Wt Readings from Last 1 Encounters:  04/14/23 70 kg    Ideal Body Weight:  52 kg  BMI:  Body mass index is 27.34 kg/m.  Estimated Nutritional Needs:   Kcal:  1500-1700kcal/day  Protein:  90-105g/day  Fluid:  1.5-1.7L/day  Betsey Holiday MS, RD, LDN Please refer to [facility] for RD and/or RD on-call/weekend/after hours pager

## 2023-04-15 NOTE — Consult Note (Signed)
NAME:  Emma Gould, MRN:  [id number], DOB:  [birth year], LOS: 8 ADMISSION DATE:  04/22/2023, CONSULTATION DATE:  04/15/2023 REFERRING MD:  Manuela Schwartz, NP, CHIEF COMPLAINT:  Chronic Diarrhea   Brief Pt Description / Synopsis:  67 y.o female admitted with Chronic Diarrhea and Pancolitis, Acute Hypoxic Respiratory Failure, HFpEF, and AKI.  Hospital course complicated by in-hospital PEA cardiac arrest (approximately 8-9 minutes of ACLS).  Now with shock and concern for possible anoxic brain injury.  History of Present Illness:  Emma Gould is a 67 y.o. female with medical history significant for hypertension, , CKD llla, nonobstructive CAD, dCHF, anxiety, with a 17-month history of chronic diarrhea, hospitalized early March 2024 at [facility] with critically low potassium of less than 2 and 3/18-3/22 at [facility] for noninfectious colitis complicated by AKI and hypokalemia, discharged on loperamide and Bentyl and referred to GI.  She presented to [facility] ED on 04/26/2023 with a 2-week complaint of abdominal pain, generalized weakness, resulting in frequent falls, most recently on the day of arrival, not resulting in any injury.  She denies fever, chills, dysuria and denies cough, chest pain or shortness of breath.  Patient has persistent nausea and intermittent vomiting to where it is difficult to keep anything on and has associated epigastric discomfort.  Vomiting is nonbloody nonbilious and non-coffee-ground.  His stool is dark, liquid.  She has not seen a gastroenterologist for this problem.  ED Course: Initial Vital Signs: Within normal limits Significant Labs: WBC 16,000, hemoglobin 15, creatinine 3.17, up from baseline of 1.5 with bicarb of 14 and anion gap of 16  Imaging  CT abdomen and pelvis shows findings that represent mild colitis infectious or inflammatory. Medications Administered: fluid bolus started on sodium bicarb and dextrose as well as ceftriaxone and metronidazole.   Hospitalist were asked to  admit for further workup and treatment.  Please see "Significant Hospital Events" section below for full detailed hospital course.   Pertinent  Medical History  CKD stage 3a HFpEF CAD Anxiety & Depression HTN Fibromyalgia  Micro Data:  4/12: C. Diff/GI panel PCR>>negative 4/18: MRSA PCR>>negative 4/19: Tracheal aspirate>>  Antimicrobials:   Anti-infectives (From admission, onward)    Start     Dose/Rate Route Frequency Ordered Stop   04/15/23 0952  vancomycin variable dose per unstable renal function (pharmacist dosing)  Status:  Discontinued         Does not apply See admin instructions 04/15/23 0952 04/15/23 1012   04/14/23 2100  vancomycin (VANCOREADY) IVPB 1500 mg/300 mL  Status:  Discontinued        1,500 mg 150 mL/hr over 120 Minutes Intravenous Every 48 hours 04/14/23 1845 04/15/23 0952   04/14/23 2000  ceFEPIme (MAXIPIME) 2 g in sodium chloride 0.9 % 100 mL IVPB  Status:  Discontinued        2 g 200 mL/hr over 30 Minutes Intravenous Every 12 hours 04/14/23 1843 04/15/23 1013   04/08/23 2000  cefTRIAXone (ROCEPHIN) 2 g in sodium chloride 0.9 % 100 mL IVPB  Status:  Discontinued        2 g 200 mL/hr over 30 Minutes Intravenous Every 24 hours 04/08/23 1200 03/31/2023 1133   04/17/2023 1915  cefTRIAXone (ROCEPHIN) 2 g in sodium chloride 0.9 % 100 mL IVPB        2 g 200 mL/hr over 30 Minutes Intravenous  Once 04/22/2023 1913 03/31/2023 2029   04/19/2023 1915  metroNIDAZOLE (FLAGYL) IVPB 500 mg  Status:  Discontinued  500 mg 100 mL/hr over 60 Minutes Intravenous Every 8 hours Apr 27, 2023 1913 03/31/2023 1133        Significant Hospital Events: Including procedures, antibiotic start and stop dates in addition to other pertinent events   04/27/2023: Admitted by Hospitalist.  GI consulted 4/12: GI recommends empiric trial of pancreatic enzymes, along with plan for  EGD and colonoscopy 4/13: EGD/colonoscopy cancelled due to lack of bowel prep as pt unable to tolerate PO. 4/14:  Nephrology consulted for AKI and multiple metabolic derangements. PICC placement for aggressive repletion of electrolytes 4/15: AKI and electrolytes improved with repletion 4/16:  GI performed EGD and Colonoscopy: EGD: Gastritis, stomach and duodenum biopsied.  Colonoscopy: diffuse severe inflammation in colon due to pancolitis. Multiple polyps vs pseudopolyps, nonbleeding internal hemorrhoids. 4/17:  Continued poor PO intake, now with constipation.  Renal function improving 4/18: Again with diarrhea.  Later in the day with worsening hypoxia and respiratory distress, poor response to IV Lasix (20 mg x2).  Empirically started on Cefepime and Vancomycin worsening of diffuse opacities on CXR. Transferred to Stepdown. 4/19: Suffered in-hospital PEA cardiac arrest of 8-9 minutes in duration. Suspect respiratory in etiology (severe hypoxia as pt removed supplemental O2).  Unresponsive, concern for possible anoxic brain injury.  Critically ill with multiorgan failure. Re-consult Cardiology.  Interim History / Subjective:  -This morning with brief inpatient cardiac arrest, suspect respiratory etiology as patient severely hypoxic following removal of oxygen with progressive bradycardia episode leading to PEA arrest -NO STEMI on EKG -Currently on the vent, overbreathing the vent, positive brainstem reflexes, but has NOT received sedation and remains unresponsive, pupils fixed and dilated at 5 mm  bilaterally ~concern for anoxic brain injury -Plan to obtain CT head, CTA chest and abdomen ~currently hemodynamically unstable, will obtain when able   Objective   Blood pressure 93/70, pulse (!) 29, temperature (!) 97.3 F (36.3 C), temperature source Esophageal, resp. rate (!) 24, height 5\' 3"  (1.6 m), weight 70 kg, SpO2 96 %.    Vent Mode: PRVC FiO2 (%):  [96 %-100 %] 100 % Set Rate:  [16 bmp] 16 bmp Vt Set:  [450 mL] 450 mL PEEP:  [5 cmH20] 5 cmH20   Intake/Output Summary (Last 24 hours) at 04/15/2023  2536 Last data filed at 04/15/2023 0645 Gross per 24 hour  Intake 822.32 ml  Output 120 ml  Net 702.32 ml   Filed Weights   04/22/2023 0430 04/13/23 0500 04/14/23 0500  Weight: 63.9 kg 71 kg 70 kg    Examination: General: Critically ill-appearing female, on the ventilator, unresponsive, no acute distress HENT: Atraumatic, normocephalic, neck supple, no JVD Lungs: Coarse breath sounds bilaterally, even, overbreathing the vent Cardiovascular: Tachycardia, regular rhythm, S1-S2, no murmurs, rubs, gallops Abdomen: Soft, nontender, nondistended, no guarding or rebound tenderness, bowel sounds positive x 4 Extremities: Warm/dry, pulses positive to R/P, 2+ edema noted to bilateral lower extremities Neuro: Unresponsive (has not received sedation), pupils fixed and dilated at 5 mm bilaterally, does have positive brainstem reflexes (cough/gag/corneal reflexes) GU: Foley catheter in place draining scant amount of yellow urine  Resolved Hospital Problem list     Assessment & Plan:   #Cardiac arrest: initial rhythm PEA s/t suspected respiratory arrest/severe hypoxia #Undifferentiated Shock: Hypovolemic vs Septic vs Cardiogenic #Acute on Chronic HFpEF #Elevated Troponin in setting of Demand ischemia vs NSTEMI PMHx: HTN, HFpEF, non obstructive CAD 9 minutes downtime, CPR initiated immediately, received 1 defibrillations, suspected suspected anoxic injury -Continuous cardiac monitoring -Maintain MAP >65 -Vasopressors as needed to  maintain MAP goal -Check CVP and Coox panel -Trend lactic acid until normalized (7.7 ~ 6.0 ~ ) -Trend HS Troponin until peaked (576 ~ 1276 ~ 2315 ~) -Echocardiogram pending -STAT CTa chest to rule out PE ~ will obtain once able, currently hemodynamically unstable -Consider Normothermia Protocol if temp > 37.6, depending on imaging results -Reluctant to start Empiric systemic heparin given thrombocytopenia, INR 2.4, and concern for shock liver -Consult Cardiology,  appreciate input  #Acute hypoxic respiratory failure multifocal in the setting of HFpEF exacerbation & cardiac arrest, ? Developing HAP -Full vent support, implement lung protective strategies -Plateau pressures less than 30 cm H20 -Wean FiO2 & PEEP as tolerated to maintain O2 sats >92% -Follow intermittent Chest X-ray & ABG as needed -Spontaneous Breathing Trials when respiratory parameters met and mental status permits -Implement VAP Bundle -Prn Bronchodilators -Diuresis as BP and renal function permits ~ currently unable to diurese due to hypotension requiring vasopressors -Continue cefepime & vancomycin -CTA Chest pending to rule out pE  #Meets SIRS Criteria (RR >20, HR >90, WBC 16) #Severe Sepsis in setting of Pancolitis, ? Developing HAP -Monitor fever curve -Trend WBC's & Procalcitonin -Follow cultures as above -Continue empiric Zosyn pending cultures & sensitivities -CTA Chest/Abdomen/Pelvis pending  #Tranaminitis, ? Developing shock liver #Pancolitis #Chronic Diarrhea of unknown origin  Patient presents with nausea vomiting and diarrhea, similar to hospitalization at [facility] from 3/18 to 3/22 GI infectious workup was negative with negative GI panel negative ova and parasites and negative C. difficile CT abdomen and pelvis showed colitis back in March and now shows mild colitis Underwent EGD/colonoscopy 03/29/2023. EGD showed gastritis and colonoscopy showed pancolitis biopsies were taken.  Also showed polyp that was removed and nonbleeding internal hemorrhoids. -Trend LFT's and coags -CT Abdomen & Pelvis is pending -ABX as above  #Acute Kidney Injury superimposed on CKD Stage IIIa #Anion Gap Metabolic Acidosis #Lactic Acidosis -Monitor I&O's / urinary output -Follow BMP -Ensure adequate renal perfusion -Avoid nephrotoxic agents as able -Replace electrolytes as indicated, pharmacy following for assistance with electrolyte replacement -Start Bicarb gtt -Nephrology  following, appreciate input  #Thrombocytopenia #Supra therapeutic INR #Anemia without overt blood loss -Monitor for S/Sx of bleeding -Trend CBC -SCD's for VTE Prophylaxis (avoid chemical ppx for now) -Transfuse for Hgb <7 -Check peripheral smear and DIC panel ~ normal platelet morphology on smear  #Hyperglycemia -CBG's q4h; Target range of 140 to 180 -SSI -Follow ICU Hypo/Hyperglycemia protocol  #Unresponsive post Cardiac arrest #At risk for anoxic encephalopathy. -Maintain a RASS goal of 0 to -1 -Propofol is needed to maintain RASS goal -Avoid sedating medications as able -Daily wake up assessment -CT Head pending -Obtain EEG -Low threshold for Neurology consultation depending on imaging results and clinical exam    Pt is critically ill s/p cardiac arrest, unresponsive with concern for possible anoxic brain injury, and multiple organ failure.  High risk for subsequent cardiac arrest and death.  Prognosis is extremely guarded.  Recommend DNR status.  Likely will need to involve Palliative Care depending on clinical course.  Best Practice (right click and "Reselect all SmartList Selections" daily)   Diet/type: NPO DVT prophylaxis: SCD GI prophylaxis: H2B Lines: Central line, Arterial Line, and yes and it is still needed Foley:  Yes, and it is still needed Code Status:  full code Last date of multidisciplinary goals of care discussion [04/15/23]  4/19: Pt's mother and step-father updated at bedside.  Labs   CBC: Recent Labs  Lab 04/10/23 0447 04/11/23 0525 04/02/2023 0358 04/13/23 1610  04/14/23 0500  WBC 12.0* 14.0* 14.5* 18.2* 16.6*  NEUTROABS 6.3 9.0* 10.1* 13.8*  --   HGB 12.2 11.5* 10.8* 11.6* 10.8*  HCT 36.4 34.2* 31.8* 33.0* 31.0*  MCV 99.5 99.4 99.7 97.6 96.9  PLT 114* 116* 94* 103* 96*    Basic Metabolic Panel: Recent Labs  Lab 04/06/2023 0904 04/14/2023 1236 04/10/23 0447 04/11/23 0055 04/11/23 0525 04/11/23 0830 04/13/2023 0358 04/11/2023 0800  04/01/2023 0847 04/13/23 0529 04/14/23 0500 04/14/23 1657 04/15/23 0725  NA 140  --  137  --  136  --  137  --   --  136 139 137 145  K 2.7*   < > 2.5*   < > 3.7   < > 2.8* 3.3*  --  3.3* 3.4* 3.4* 3.5  CL 98  --  103  --  107  --  110  --   --  113* 113* 113* 113*  CO2 27  --  23  --  21*  --  17*  --   --  15* 17* 14* 17*  GLUCOSE 87  --  67*  --  77  --  102*  --   --  76 88 154* 120*  BUN 31*  --  28*  --  19  --  15  --   --  13 15 14 16   CREATININE 2.84*  --  2.49*  --  1.73*  --  1.46*  --   --  1.27* 1.39* 1.48* 1.69*  CALCIUM 7.4*  --  6.9*  --  7.0*  --  6.9*  --   --  7.4* 7.9* 7.7* 9.2  MG 1.0*  --  0.9*  --  2.9*  --   --   --  1.8 1.9 1.8  --  1.8  PHOS 3.5  --  3.4  --  2.3*  --   --   --   --   --  2.8  --  6.0*   < > = values in this interval not displayed.   GFR: Estimated Creatinine Clearance: 31.5 mL/min (A) (by C-G formula based on SCr of 1.69 mg/dL (H)). Recent Labs  Lab 04/11/23 0525 04/26/2023 0358 04/13/23 0529 04/14/23 0500 04/14/23 1657 04/14/23 1852 04/15/23 0500 04/15/23 0725  PROCALCITON  --   --   --  1.68 1.72  --  1.80  --   WBC 14.0* 14.5* 18.2* 16.6*  --   --   --   --   LATICACIDVEN  --   --   --   --  3.1* 1.8  --  7.7*    Liver Function Tests: Recent Labs  Lab 04/14/23 0500 04/15/23 0725  AST 48* 979*  ALT 27 164*  ALKPHOS 564* 666*  BILITOT 0.7 0.8  PROT 4.2* 4.0*  ALBUMIN <1.5* 1.5*   No results for input(s): "LIPASE", "AMYLASE" in the last 168 hours. Recent Labs  Lab 04/14/23 1333  AMMONIA 55*    ABG    Component Value Date/Time   PHART 7.35 04/14/2023 1619   PCO2ART 25 (L) 04/14/2023 1619   PO2ART 70 (L) 04/14/2023 1619   HCO3 15.7 (L) 04/15/2023 0815   TCO2 23 01/26/2023 1228   ACIDBASEDEF 12.4 (H) 04/15/2023 0815   O2SAT PENDING 04/15/2023 0815     Coagulation Profile: Recent Labs  Lab 04/15/23 0725  INR 2.4*    Cardiac Enzymes: No results for input(s): "CKTOTAL", "CKMB", "CKMBINDEX", "TROPONINI" in the  last 168 hours.  HbA1C: Hgb A1c MFr Bld  Date/Time Value Ref Range Status  04/13/2023 02:15 PM 5.1 4.8 - 5.6 % Final    Comment:    (NOTE) Pre diabetes:          5.7%-6.4%  Diabetes:              >6.4%  Glycemic control for   <7.0% adults with diabetes     CBG: Recent Labs  Lab 04/14/23 0810 04/14/23 1146 04/14/23 1637 04/14/23 2129 04/15/23 0701  GLUCAP 131* 99 150* 121* 139*    Review of Systems:   Unable to assess due to unresponsiveness, intubation, and critical illness   Past Medical History:  She,  has a past medical history of Anxiety, CHF (congestive heart failure), CKD (chronic kidney disease), Depression, Fibromyalgia, and Hypertension.   Surgical History:   Past Surgical History:  Procedure Laterality Date   CERVICAL FUSION     COLONOSCOPY N/A 04/21/2023   Procedure: COLONOSCOPY;  Surgeon: Midge Minium, MD;  Location: [facility] ENDOSCOPY;  Service: Endoscopy;  Laterality: N/A;   ESOPHAGOGASTRODUODENOSCOPY (EGD) WITH PROPOFOL N/A 04/25/2023   Procedure: ESOPHAGOGASTRODUODENOSCOPY (EGD) WITH PROPOFOL;  Surgeon: Midge Minium, MD;  Location: [facility] ENDOSCOPY;  Service: Endoscopy;  Laterality: N/A;   RIGHT/LEFT HEART CATH AND CORONARY ANGIOGRAPHY Bilateral 01/26/2023   Procedure: RIGHT/LEFT HEART CATH AND CORONARY ANGIOGRAPHY;  Surgeon: Dolores Patty, MD;  Location: [facility] INVASIVE CV LAB;  Service: Cardiovascular;  Laterality: Bilateral;     Social History:   reports that she has been smoking cigarettes. She has a 22.50 pack-year smoking history. She has never used smokeless tobacco. She reports that she does not drink alcohol and does not use drugs.   Family History:  Her family history includes Hypertension in her mother; Lung cancer in her father; Stroke in her mother.   Allergies No Known Allergies   Home Medications  Prior to Admission medications   Medication Sig Start Date End Date Taking? Authorizing Provider  acetaminophen (TYLENOL) 325 MG tablet  Take 2 tablets (650 mg total) by mouth every 6 (six) hours as needed for mild pain or headache (fever >/= 101). 09/05/19  Yes Lonia Blood, MD  carvedilol (COREG) 6.25 MG tablet Take 1 tablet (6.25 mg total) by mouth 2 (two) times daily with a meal. 02/24/23  Yes Sabharwal, Aditya, DO  empagliflozin (JARDIANCE) 10 MG TABS tablet Take 1 tablet (10 mg total) by mouth daily before breakfast. 01/25/23  Yes Bensimhon, Bevelyn Buckles, MD  Eszopiclone 3 MG TABS Take 3 mg by mouth at bedtime. 10/07/22  Yes [provider]  ezetimibe (ZETIA) 10 MG tablet Take 10 mg by mouth daily.   Yes [provider]  omeprazole (PRILOSEC) 40 MG capsule Take 40 mg by mouth daily. 06/11/19  Yes [provider]  ondansetron (ZOFRAN) 4 MG tablet Take 4 mg by mouth every 8 (eight) hours as needed for nausea. 03/18/23 04/17/23 Yes [provider]  oxyCODONE-acetaminophen (PERCOCET) 10-325 MG tablet Take 1 tablet by mouth 4 (four) times daily as needed for pain. 10/03/22  Yes [provider]  potassium chloride SA (KLOR-CON M) 20 MEQ tablet Take 2 tablets (40 mEq total) by mouth daily. 02/26/23  Yes Enedina Finner, MD  QUEtiapine (SEROQUEL) 25 MG tablet Take 4 tablets (100 mg total) by mouth at bedtime. Patient taking differently: Take 50 mg by mouth 2 (two) times daily. 02/26/23  Yes Enedina Finner, MD  sertraline (ZOLOFT) 100 MG tablet Take 100 mg by mouth daily.  Yes [provider]  spironolactone (ALDACTONE) 25 MG tablet Take 12.5 mg by mouth daily.   Yes [provider]  SUMAtriptan (IMITREX) 50 MG tablet Take 50 mg by mouth as needed for migraine. Take a second tablet 2 hours later if needed; max 200mg /day 12/21/18  Yes [provider]  topiramate (TOPAMAX) 100 MG tablet Take 100 mg by mouth 2 (two) times daily.   Yes [provider]  torsemide (DEMADEX) 20 MG tablet TAKE 1 TABLET BY MOUTH EVERY DAY 03/11/23  Yes Sabharwal, Aditya, DO  albuterol (VENTOLIN HFA)  108 (90 Base) MCG/ACT inhaler Inhale 2 puffs into the lungs 4 (four) times daily as needed for shortness of breath. Patient not taking: Reported on 04/15/2023    [provider]     Critical care time: 60 minutes     Harlon Ditty, AGACNP-BC [facility] Prefer epic messenger for cross cover needs If after hours, please call E-link

## 2023-04-15 NOTE — Progress Notes (Signed)
Arterial line is being emergently placed by NP, time out performed, and appropriate sterile procedure done.

## 2023-04-15 NOTE — Progress Notes (Signed)
Patient continues to remain in critical condition, orders are in for patient to go to CT however, patient is unstable at this time and slight movement causes oxygen desaturation to the mid 60s, NP aware.  Patient continues to have no urine output, provider aware, will continue to monitor.

## 2023-04-15 NOTE — Procedures (Signed)
Will do eeg when ultrasound finished-lights are off.

## 2023-04-15 NOTE — Progress Notes (Signed)
*  PRELIMINARY RESULTS* Echocardiogram 2D Echocardiogram has been performed.  Carolyne Fiscal 04/15/2023, 12:22 PM

## 2023-04-15 NOTE — Progress Notes (Signed)
eLink Physician-Brief Progress Note Patient Name: Emma Gould DOB: October 13, 1956 MRN: [id number]   Date of Service  04/15/2023  HPI/Events of Note  67 year old female who was initially admitted on 4/11 for acute colitis with nausea and vomiting complicated by acute kidney injury eventually found to have gastritis on EGD and multiple polyps on a colonoscopy.  On 4/18, she developed progressive hypoxemia and was transitioned to high flow nasal cannula and transferred to the ICU.  She is high risk for intubation.  On the morning of 4/19, she developed a CODE BLUE arrest for roughly 6:20 AM.  She underwent 4 rounds of CPR, 3 total milligrams of epinephrine push, 3 ampoules of bicarb push, she was promptly intubated by the ground team  eICU Interventions  [place] team is available and took the lead throughout this process.  Provided Return resuscitation, intubation, and no intervention was necessary from telemetry ICU at this time.  Post ROSC workup is underway.     Intervention Category Major Interventions: Code management / supervision  Tierre Netto 04/15/2023, 6:29 AM

## 2023-04-15 NOTE — Progress Notes (Signed)
Eeg done 

## 2023-04-15 NOTE — Progress Notes (Signed)
PT Cancellation Note  Patient Details Name: Emma Gould MRN: [id number] DOB: 12-08-1956   Cancelled Treatment:    Reason Eval/Treat Not Completed: Patient not medically ready. Patient not appropriate for PT evaluation this morning. Will continue to follow.    Gorman Safi 04/15/2023, 8:51 AM

## 2023-04-15 NOTE — Consult Note (Signed)
ANTICOAGULATION CONSULT NOTE - Initial Consult  Pharmacy Consult for heparin infusion Indication: pulmonary embolus  No Known Allergies  Patient Measurements: Height:  (160 cm) Weight: 70 kg (154 lb 5.2 oz) IBW/kg (Calculated) : 52.4 Heparin Dosing Weight: 70 kg  Vital Signs: Temp: 98.1 F (36.7 C) (04/19 2020) Temp Source: Esophageal (04/19 1900) Pulse Rate: 110 (04/19 2020)  Labs: Recent Labs    04/14/23 0500 04/14/23 1657 04/15/23 0725 04/15/23 0900 04/15/23 1045 04/15/23 1124  HGB 10.8*  --  10.3*  --  10.3*  --   HCT 31.0*  --  30.8*  --  30.6*  --   PLT 96*  --  52*  --  43*  --   LABPROT  --   --  25.7*  --   --   --   INR  --   --  2.4*  --   --   --   CREATININE 1.39* 1.48* 1.69*  --   --   --   TROPONINIHS  --  85* 576* 1,276*  --  2,315*    Estimated Creatinine Clearance: 31.5 mL/min (A) (by C-G formula based on SCr of 1.69 mg/dL (H)).   Medical History: Past Medical History:  Diagnosis Date   Anxiety    CHF (congestive heart failure)    CKD (chronic kidney disease)    Depression    Fibromyalgia    Hypertension     Medications:  PTA: N/A Inpatient: heparin infusion (4/19 >>>) Allergies: NKDA  Assessment: 67 year old female with history of CHF, mixed pulmonary hypertension, and nonobstructive CAD admitted with worsening hypoxia. US Doppler study confirmed occlusive thrombus within the paired left peroneal veins of the left calf. Spoke with ICU evening coverage who reports findings suspicious of pulmonary embolism. Pharmacy consulted for heparin infusion in the setting of PE.  Date Time aPTT/HL Rate/Comment  Goal of Therapy:  Heparin level 0.3-0.7 units/ml Monitor platelets by anticoagulation protocol: Yes  Plan:  Give 5000 units bolus x1; then start heparin infusion at 1200 units/hr Check anti-Xa level in 6 hours and daily once consecutively therapeutic. Continue to monitor H&H and platelets daily while on heparin gtt.   Selinda Eon Clinical Pharmacist 04/15/2023 9:00 PM

## 2023-04-16 ENCOUNTER — Inpatient Hospital Stay: Payer: Medicare Other

## 2023-04-16 ENCOUNTER — Ambulatory Visit: Admission: RE | Admit: 2023-04-16 | Payer: Medicare Other | Source: Ambulatory Visit

## 2023-04-16 DIAGNOSIS — G931 Anoxic brain damage, not elsewhere classified: Secondary | ICD-10-CM | POA: Diagnosis not present

## 2023-04-16 LAB — COMPREHENSIVE METABOLIC PANEL
ALT: 124 U/L — ABNORMAL HIGH (ref 0–44)
AST: 604 U/L — ABNORMAL HIGH (ref 15–41)
Albumin: <1.5 g/dL — ABNORMAL LOW (ref 3.5–5.0)
Alkaline Phosphatase: 622 U/L — ABNORMAL HIGH (ref 38–126)
Anion gap: 22 — ABNORMAL HIGH (ref 5–15)
BUN: 19 mg/dL (ref 8–23)
CO2: 10 mmol/L — ABNORMAL LOW (ref 22–32)
Calcium: 8.5 mg/dL — ABNORMAL LOW (ref 8.9–10.3)
Chloride: 110 mmol/L (ref 98–111)
Creatinine, Ser: 2.03 mg/dL — ABNORMAL HIGH (ref 0.44–1.00)
GFR, Estimated: 27 mL/min — ABNORMAL LOW (ref >60)
Glucose, Bld: 100 mg/dL — ABNORMAL HIGH (ref 70–99)
Potassium: 4.4 mmol/L (ref 3.5–5.1)
Sodium: 142 mmol/L (ref 135–145)
Total Bilirubin: 1.6 mg/dL — ABNORMAL HIGH (ref 0.3–1.2)
Total Protein: 3.9 g/dL — ABNORMAL LOW (ref 6.5–8.1)

## 2023-04-16 LAB — TRIGLYCERIDES: Triglycerides: 350 mg/dL — ABNORMAL HIGH (ref <150)

## 2023-04-16 LAB — CBC
HCT: 32.5 % — ABNORMAL LOW (ref 36.0–46.0)
Hemoglobin: 10.7 g/dL — ABNORMAL LOW (ref 12.0–15.0)
MCH: 34.1 pg — ABNORMAL HIGH (ref 26.0–34.0)
MCHC: 32.9 g/dL (ref 30.0–36.0)
MCV: 103.5 fL — ABNORMAL HIGH (ref 80.0–100.0)
Platelets: 13 10*3/uL — CL (ref 150–400)
RBC: 3.14 MIL/uL — ABNORMAL LOW (ref 3.87–5.11)
RDW: 17.1 % — ABNORMAL HIGH (ref 11.5–15.5)
WBC: 22.2 10*3/uL — ABNORMAL HIGH (ref 4.0–10.5)
nRBC: 16.9 % — ABNORMAL HIGH (ref 0.0–0.2)

## 2023-04-16 LAB — PROTIME-INR
INR: 4.3 (ref 0.8–1.2)
Prothrombin Time: 41.3 seconds — ABNORMAL HIGH (ref 11.4–15.2)

## 2023-04-16 LAB — GLUCOSE, CAPILLARY
Glucose-Capillary: 84 mg/dL (ref 70–99)
Glucose-Capillary: 13 mg/dL — CL (ref 70–99)
Glucose-Capillary: 53 mg/dL — ABNORMAL LOW (ref 70–99)
Glucose-Capillary: <10 mg/dL — CL (ref 70–99)
Glucose-Capillary: 173 mg/dL — ABNORMAL HIGH (ref 70–99)

## 2023-04-16 LAB — HEPARIN LEVEL (UNFRACTIONATED)
Heparin Unfractionated: 0.57 IU/mL (ref 0.30–0.70)
Heparin Unfractionated: 0.55 IU/mL (ref 0.30–0.70)

## 2023-04-16 LAB — PHOSPHORUS: Phosphorus: 6.5 mg/dL — ABNORMAL HIGH (ref 2.5–4.6)

## 2023-04-16 LAB — MAGNESIUM: Magnesium: 2.7 mg/dL — ABNORMAL HIGH (ref 1.7–2.4)

## 2023-04-16 MED ORDER — MORPHINE 100MG IN NS 100ML (1MG/ML) PREMIX INFUSION
1.0000 mg/h | INTRAVENOUS | Status: DC
  Administered 2023-04-16: 1 mg/h via INTRAVENOUS
  Filled 2023-04-16: qty 100

## 2023-04-16 MED ORDER — SODIUM CHLORIDE 0.9 % IV BOLUS
500.0000 mL | Freq: Once | INTRAVENOUS | Status: AC
  Administered 2023-04-16: 500 mL via INTRAVENOUS

## 2023-04-16 MED ORDER — DEXTROSE 50 % IV SOLN
25.0000 g | INTRAVENOUS | Status: AC
  Administered 2023-04-16: 25 g via INTRAVENOUS
  Filled 2023-04-16: qty 50

## 2023-04-16 MED ORDER — PHENYLEPHRINE HCL-NACL 20-0.9 MG/250ML-% IV SOLN
0.0000 ug/min | INTRAVENOUS | Status: DC
  Administered 2023-04-16: 50 ug/min via INTRAVENOUS
  Filled 2023-04-16: qty 500
  Filled 2023-04-16: qty 250

## 2023-04-16 MED ORDER — SODIUM BICARBONATE 8.4 % IV SOLN
INTRAVENOUS | Status: DC
  Filled 2023-04-16: qty 150

## 2023-04-16 MED ORDER — LORAZEPAM 2 MG/ML IJ SOLN
0.5000 mg/h | INTRAVENOUS | Status: DC
  Administered 2023-04-16: 0.5 mg/h via INTRAVENOUS
  Filled 2023-04-16: qty 25

## 2023-04-16 MED ORDER — STERILE WATER FOR INJECTION IV SOLN
INTRAVENOUS | Status: DC
  Filled 2023-04-16: qty 1000

## 2023-04-17 LAB — CULTURE, RESPIRATORY W GRAM STAIN

## 2023-04-18 LAB — VITAMIN A: Vitamin A (Retinoic Acid): 9.1 ug/dL — ABNORMAL LOW (ref 22.0–69.5)

## 2023-04-18 LAB — VITAMIN E
Vitamin E (Alpha Tocopherol): 12.1 mg/L (ref 9.0–29.0)
Vitamin E(Gamma Tocopherol): 3.1 mg/L (ref 0.5–4.9)

## 2023-04-18 LAB — MISC LABCORP TEST (SEND OUT): Labcorp test code: 70115

## 2023-04-20 LAB — BLOOD GAS, VENOUS
Acid-base deficit: 12.4 mmol/L — ABNORMAL HIGH (ref 0.0–2.0)
Patient temperature: 37
pH, Ven: 7.17 — CL (ref 7.25–7.43)

## 2023-04-25 ENCOUNTER — Other Ambulatory Visit: Payer: Self-pay | Admitting: Internal Medicine

## 2023-04-27 NOTE — Progress Notes (Signed)
[facility]  ROUNDING NOTE   Subjective:   Mother and step father at bedside.  Patient remains critical.   Norepinephrine, vasopressin and phenylephrine gtt Bicarb gtt    Objective:  Vital signs in last 24 hours:  Temp:  [97.2 F (36.2 C)-99.7 F (37.6 C)] 97.2 F (36.2 C) (04/20 1130) Pulse Rate:  [98-129] 109 (04/20 0830) Resp:  [26-41] 27 (04/20 1130) SpO2:  [87 %-100 %] 100 % (04/20 1141) Arterial Line BP: (93-131)/(32-79) 113/40 (04/20 1130) FiO2 (%):  [60 %-80 %] 60 % (04/20 1141) Weight:  [74.7 kg] 74.7 kg (04/20 0714)  Weight change:  Filed Weights   04/13/23 0500 04/14/23 0500 03/29/2023 0714  Weight: 71 kg 70 kg 74.7 kg    Intake/Output: I/O last 3 completed shifts: In: 2516.2 [P.O.:100; I.V.:1431.4; IV Piggyback:984.8] Out: 245 [Urine:135; Emesis/NG output:110]   Intake/Output this shift:  Total I/O In: 513.3 [I.V.:103.5; IV Piggyback:409.9] Out: -   Physical Exam: General: Critically ill  Head: Normocephalic, atraumatic.   Eyes: Anicteric  Lungs:  PRVC 60%  Heart: Regular rate and rhythm  Abdomen:  Soft, nontender  Extremities:  No peripheral edema.  Neurologic: Sedated  Skin: No lesions  Access: None    Basic Metabolic Panel: Recent Labs  Lab 04/10/23 0447 04/11/23 0055 04/11/23 0525 04/11/23 0830 05-06-2023 0847 04/13/23 0529 04/14/23 0500 04/14/23 1657 04/15/23 0725 04/25/2023 0310  NA 137  --  136   < >  --  136 139 137 145 142  K 2.5*   < > 3.7   < >  --  3.3* 3.4* 3.4* 3.5 4.4  CL 103  --  107   < >  --  113* 113* 113* 113* 110  CO2 23  --  21*   < >  --  15* 17* 14* 17* 10*  GLUCOSE 67*  --  77   < >  --  76 88 154* 120* 100*  BUN 28*  --  19   < >  --  13 15 14 16 19   CREATININE 2.49*  --  1.73*   < >  --  1.27* 1.39* 1.48* 1.69* 2.03*  CALCIUM 6.9*  --  7.0*   < >  --  7.4* 7.9* 7.7* 9.2 8.5*  MG 0.9*  --  2.9*  --  1.8 1.9 1.8  --  1.8 2.7*  PHOS 3.4  --  2.3*  --   --   --  2.8  --  6.0* 6.5*   < > = values  in this interval not displayed.     Liver Function Tests: Recent Labs  Lab 04/14/23 0500 04/15/23 0725 04/20/2023 0310  AST 48* 979* 604*  ALT 27 164* 124*  ALKPHOS 564* 666* 622*  BILITOT 0.7 0.8 1.6*  PROT 4.2* 4.0* 3.9*  ALBUMIN <1.5* 1.5* <1.5*    Recent Labs  Lab 04/15/23 0900  LIPASE 14    Recent Labs  Lab 04/14/23 1333  AMMONIA 55*     CBC: Recent Labs  Lab 04/10/23 0447 04/11/23 0525 05/06/23 0358 04/13/23 0529 04/14/23 0500 04/15/23 0725 04/15/23 1045 04/02/2023 0310  WBC 12.0* 14.0* 14.5* 18.2* 16.6* 16.2* 16.5* 22.2*  NEUTROABS 6.3 9.0* 10.1* 13.8*  --   --  12.8*  --   HGB 12.2 11.5* 10.8* 11.6* 10.8* 10.3* 10.3* 10.7*  HCT 36.4 34.2* 31.8* 33.0* 31.0* 30.8* 30.6* 32.5*  MCV 99.5 99.4 99.7 97.6 96.9 100.7* 101.0* 103.5*  PLT 114* 116* 94*  103* 96* 52* 43* 13*     Cardiac Enzymes: No results for input(s): "CKTOTAL", "CKMB", "CKMBINDEX", "TROPONINI" in the last 168 hours.  BNP: Invalid input(s): "POCBNP"  CBG: Recent Labs  Lab 04/15/23 2332 04-18-2023 0352 04/18/2023 0716 2023-04-18 0720 18-Apr-2023 0724  GLUCAP 121* 84 <10* 13* 53*     Microbiology: Results for orders placed or performed during the hospital encounter of 04/13/2023  Gastrointestinal Panel by PCR , Stool     Status: None   Collection Time: 04/08/23 11:40 AM   Specimen: Stool  Result Value Ref Range Status   Campylobacter species NOT DETECTED NOT DETECTED Final   Plesimonas shigelloides NOT DETECTED NOT DETECTED Final   Salmonella species NOT DETECTED NOT DETECTED Final   Yersinia enterocolitica NOT DETECTED NOT DETECTED Final   Vibrio species NOT DETECTED NOT DETECTED Final   Vibrio cholerae NOT DETECTED NOT DETECTED Final   Enteroaggregative E coli (EAEC) NOT DETECTED NOT DETECTED Final   Enteropathogenic E coli (EPEC) NOT DETECTED NOT DETECTED Final   Enterotoxigenic E coli (ETEC) NOT DETECTED NOT DETECTED Final   Shiga like toxin producing E coli (STEC) NOT DETECTED NOT  DETECTED Final   Shigella/Enteroinvasive E coli (EIEC) NOT DETECTED NOT DETECTED Final   Cryptosporidium NOT DETECTED NOT DETECTED Final   Cyclospora cayetanensis NOT DETECTED NOT DETECTED Final   Entamoeba histolytica NOT DETECTED NOT DETECTED Final   Giardia lamblia NOT DETECTED NOT DETECTED Final   Adenovirus F40/41 NOT DETECTED NOT DETECTED Final   Astrovirus NOT DETECTED NOT DETECTED Final   Norovirus GI/GII NOT DETECTED NOT DETECTED Final   Rotavirus A NOT DETECTED NOT DETECTED Final   Sapovirus (I, II, IV, and V) NOT DETECTED NOT DETECTED Final    Comment: Performed at [facility], [address] [place], [place] [address]  C Difficile Quick Screen w PCR reflex     Status: None   Collection Time: 04/08/23 11:40 AM   Specimen: STOOL  Result Value Ref Range Status   C Diff antigen NEGATIVE NEGATIVE Final   C Diff toxin NEGATIVE NEGATIVE Final   C Diff interpretation No C. difficile detected.  Final    Comment: Performed at [facility], [address] [place], [place] [address]  MRSA Next Gen by PCR, Nasal     Status: None   Collection Time: 04/14/23  4:39 PM   Specimen: Nasal Mucosa; Nasal Swab  Result Value Ref Range Status   MRSA by PCR Next Gen NOT DETECTED NOT DETECTED Final    Comment: (NOTE) The GeneXpert MRSA Assay (FDA approved for NASAL specimens only), is one component of a comprehensive MRSA colonization surveillance program. It is not intended to diagnose MRSA infection nor to guide or monitor treatment for MRSA infections. Test performance is not FDA approved in patients less than 45 years old. Performed at [facility], [address] [place], [place] [address]   Culture, Respiratory w Gram Stain     Status: None (Preliminary result)   Collection Time: 04/15/23  1:05 PM   Specimen: Tracheal Aspirate; Respiratory  Result Value Ref Range Status   Specimen Description   Final    TRACHEAL ASPIRATE Performed at [facility], [address], [place], [place] [address]    Special Requests   Final    NONE Performed at [facility], [address] [place], [place] [address]    Gram Stain   Final    RARE WBC PRESENT, PREDOMINANTLY PMN FEW BUDDING  YEAST SEEN RARE YEAST WITH PSEUDOHYPHAE    Culture   Final    CULTURE REINCUBATED FOR BETTER GROWTH Performed at [facility], [address] [place], [place] [address]    Report Status PENDING  Incomplete    Coagulation Studies: Recent Labs    04/15/23 0725 04/20/2023 0310  LABPROT 25.7* 41.3*  INR 2.4* 4.3*     Urinalysis: No results for input(s): "COLORURINE", "LABSPEC", "PHURINE", "GLUCOSEU", "HGBUR", "BILIRUBINUR", "KETONESUR", "PROTEINUR", "UROBILINOGEN", "NITRITE", "LEUKOCYTESUR" in the last 72 hours.  Invalid input(s): "APPERANCEUR"    Imaging: DG Abd 1 View  Result Date: 04/24/2023 CLINICAL DATA:  Check gastric catheter placement EXAM: ABDOMEN - 1 VIEW COMPARISON:  04/15/23 FINDINGS: Scattered large and small bowel gas is noted. No free air is seen. Gastric catheter extends into the stomach. Proximal side port lies at the gastroesophageal junction. This could be advanced slightly deeper into the stomach. IMPRESSION: Gastric catheter as described. This should be advanced deeper into the stomach. Electronically Signed   By: Alcide Clever M.D.   On: 04/06/2023 08:50   DG Chest Port 1 View  Result Date: 04/08/2023 CLINICAL DATA:  Hypoxia EXAM: PORTABLE CHEST 1 VIEW COMPARISON:  04/15/2023 FINDINGS: Cardiac shadow is stable. Endotracheal tube, gastric catheter and right PICC are again seen and stable. Diffuse airspace opacities are again identified throughout both lungs but significantly improved when compared with the previous day. No effusion or pneumothorax is noted. IMPRESSION: Persistent but improved airspace opacities bilaterally. Electronically Signed   By: Alcide Clever M.D.   On: 03/29/2023 08:48   US Venous Img Lower  Bilateral (DVT)  Result Date: 04/15/2023 CLINICAL DATA:  Lower extremity edema EXAM: BILATERAL LOWER EXTREMITY VENOUS DOPPLER ULTRASOUND TECHNIQUE: Gray-scale sonography with compression, as well as color and duplex ultrasound, were performed to evaluate the deep venous system(s) from the level of the common femoral vein through the popliteal and proximal calf veins. COMPARISON:  None Available. FINDINGS: VENOUS There is occlusive thrombus within the paired left peroneal veins of the left calf. Normal compressibility of the common femoral, superficial femoral, and popliteal veins, as well as the remaining visualized calf veins. Visualized portions of profunda femoral vein and great saphenous vein unremarkable. No filling defects to suggest DVT on grayscale or color Doppler imaging within the femoropopliteal vasculature. Doppler waveforms show normal direction of venous flow, normal respiratory plasticity and response to augmentation within the segments. Limited views of the contralateral common femoral vein are unremarkable. OTHER None. Limitations: none IMPRESSION: 1. Occlusive thrombus within the paired peroneal veins of the left calf. Thrombus does not extend into the visualized left popliteal vein. 2. These results will be called to the ordering clinician or representative by the Radiologist Assistant, and communication documented in the PACS or [employer]. Electronically Signed   By: Helyn Numbers M.D.   On: 04/15/2023 20:30   EEG adult  Result Date: 04/15/2023 Rejeana Brock, MD     04/13/2023  9:49 AM History: 67 yo F s/p cardiac arrest Sedation: none Technique: This EEG was acquired with electrodes placed according to the International 10-20 electrode system (including Fp1, Fp2, F3, F4, C3, C4, P3, P4, O1, O2, T3, T4, T5, T6, A1, A2, Fz, Cz, Pz). The following electrodes were missing or displaced: none. Background: This EEG is markedly abnormal with a lack of normal background rhythms.   The background consists at times of generalized attenuation, and at other times generalized irregular slow activity.  Superimposed on this there are periodic  generalized discharges with smoothly contoured delta wave morphology with no evidence of evolution or other concerning features. Photic stimulation: Physiologic driving is now performed EEG Abnormalities: 1) generalized periodic discharges with delta morphology 2) generalized irregular slow activity 3) absent posterior dominant rhythm Clinical Interpretation: This EEG is consistent with a severe generalized cerebral dysfunction (encephalopathy).  In the setting of cardiac arrest, the generalized.  I discharges do raise concern for poor prognosis, but without clear burst suppression, epileptiform morphology, I am not sure that this is definitive for poor prognosis, but is highly concerning for significant cerebral injury. Ritta Slot, MD [facility] [phone] If 7pm- 7am, please page neurology on call as listed in AMION.   ECHOCARDIOGRAM COMPLETE  Result Date: 04/15/2023    ECHOCARDIOGRAM REPORT   Patient Name:   Emma Gould Date of Exam: 04/15/2023 Medical Rec #:  [id number]     Height:       63.0 in Accession #:    [id number]    Weight:       154.3 lb Date of Birth:  02/24/1956      BSA:          1.732 m Patient Age:    66 years      BP:           97/65 mmHg Patient Gender: F             HR:           104 bpm. Exam Location:  [facility] Procedure: 2D Echo, Cardiac Doppler, Color Doppler and Intracardiac            Opacification Agent Indications:     CHF  History:         Patient has prior history of Echocardiogram examinations, most                  recent 12/10/2022. CHF, CAD and Acute MI; Risk                  Factors:Hypertension, Dyslipidemia and Current Smoker.  Sonographer:     Mikki Harbor Referring Phys:  [id number] KELLY A GRIFFITH Diagnosing Phys: Julien Nordmann MD  Sonographer Comments: Technically difficult study due to poor  echo windows and echo performed with patient supine and on artificial respirator. IMPRESSIONS  1. Left ventricular ejection fraction, by estimation, is 50 to 55%. The left ventricle has low normal function. Challenging images, unable to exclude regional wall motion abnormality. Left ventricular diastolic parameters are consistent with Grade I diastolic dysfunction (impaired relaxation).  2. Right ventricular systolic function is normal. The right ventricular size is mildly enlarged. There is mildly elevated pulmonary artery systolic pressure. The estimated right ventricular systolic pressure is 36.9 mmHg.  3. The mitral valve is normal in structure. Mild to moderate mitral valve regurgitation. No evidence of mitral stenosis.  4. Tricuspid valve regurgitation is moderate.  5. The aortic valve has an indeterminant number of cusps. Aortic valve regurgitation is mild to moderate. Aortic valve sclerosis is present, with no evidence of aortic valve stenosis.  6. The inferior vena cava is normal in size with <50% respiratory variability, suggesting right atrial pressure of 8 mmHg. FINDINGS  Left Ventricle: Left ventricular ejection fraction, by estimation, is 50 to 55%. The left ventricle has low normal function. The left ventricle has no regional wall motion abnormalities. Definity contrast agent was given IV to delineate the left ventricular endocardial borders. The left ventricular internal cavity size was normal in size. There is  no left ventricular hypertrophy. Left ventricular diastolic parameters are consistent with Grade I diastolic dysfunction (impaired relaxation). Right Ventricle: The right ventricular size is mildly enlarged. No increase in right ventricular wall thickness. Right ventricular systolic function is normal. There is mildly elevated pulmonary artery systolic pressure. The tricuspid regurgitant velocity is 2.69 m/s, and with an assumed right atrial pressure of 8 mmHg, the estimated right ventricular  systolic pressure is 36.9 mmHg. Left Atrium: Left atrial size was normal in size. Right Atrium: Right atrial size was normal in size. Pericardium: There is no evidence of pericardial effusion. Mitral Valve: The mitral valve is normal in structure. Mild to moderate mitral valve regurgitation. No evidence of mitral valve stenosis. MV peak gradient, 2.5 mmHg. The mean mitral valve gradient is 1.0 mmHg. Tricuspid Valve: The tricuspid valve is normal in structure. Tricuspid valve regurgitation is moderate . No evidence of tricuspid stenosis. Aortic Valve: The aortic valve has an indeterminant number of cusps. Aortic valve regurgitation is mild to moderate. Aortic valve sclerosis is present, with no evidence of aortic valve stenosis. Aortic valve mean gradient measures 3.0 mmHg. Aortic valve peak gradient measures 7.0 mmHg. Aortic valve area, by VTI measures 2.90 cm. Pulmonic Valve: The pulmonic valve was normal in structure. Pulmonic valve regurgitation is not visualized. No evidence of pulmonic stenosis. Aorta: The aortic root is normal in size and structure. Venous: The inferior vena cava is normal in size with less than 50% respiratory variability, suggesting right atrial pressure of 8 mmHg. IAS/Shunts: No atrial level shunt detected by color flow Doppler.  LEFT VENTRICLE PLAX 2D LVIDd:         4.20 cm   Diastology LVIDs:         3.20 cm   LV e' medial:    6.09 cm/s LV PW:         0.90 cm   LV E/e' medial:  7.1 LV IVS:        0.90 cm   LV e' lateral:   10.30 cm/s LVOT diam:     2.00 cm   LV E/e' lateral: 4.2 LV SV:         64 LV SV Index:   37 LVOT Area:     3.14 cm  RIGHT VENTRICLE RV Basal diam:  2.75 cm RV Mid diam:    2.50 cm RV S prime:     15.00 cm/s TAPSE (M-mode): 1.7 cm LEFT ATRIUM           Index        RIGHT ATRIUM           Index LA diam:      3.50 cm 2.02 cm/m   RA Area:     11.60 cm LA Vol (A2C): 31.9 ml 18.42 ml/m  RA Volume:   25.60 ml  14.78 ml/m LA Vol (A4C): 15.6 ml 9.01 ml/m  AORTIC VALVE                     PULMONIC VALVE AV Area (Vmax):    2.74 cm     PV Vmax:       0.86 m/s AV Area (Vmean):   2.60 cm     PV Peak grad:  3.0 mmHg AV Area (VTI):     2.90 cm AV Vmax:           132.00 cm/s AV Vmean:          78.200 cm/s AV VTI:  0.221 m AV Peak Grad:      7.0 mmHg AV Mean Grad:      3.0 mmHg LVOT Vmax:         115.00 cm/s LVOT Vmean:        64.700 cm/s LVOT VTI:          0.204 m LVOT/AV VTI ratio: 0.92  AORTA Ao Root diam: 2.65 cm Ao Asc diam:  2.80 cm MITRAL VALVE               TRICUSPID VALVE MV Area (PHT): 4.46 cm    TR Peak grad:   28.9 mmHg MV Area VTI:   4.16 cm    TR Vmax:        269.00 cm/s MV Peak grad:  2.5 mmHg MV Mean grad:  1.0 mmHg    SHUNTS MV Vmax:       0.79 m/s    Systemic VTI:  0.20 m MV Vmean:      52.8 cm/s   Systemic Diam: 2.00 cm MV Decel Time: 170 msec MV E velocity: 43.00 cm/s MV A velocity: 79.80 cm/s MV E/A ratio:  0.54 Julien Nordmann MD Electronically signed by Julien Nordmann MD Signature Date/Time: 04/15/2023/1:17:19 PM    Final    DG Chest Port 1 View  Result Date: 04/15/2023 CLINICAL DATA:  [id number]. Encounter for orogastric tube placement. Check ETT. EXAM: PORTABLE CHEST 1 VIEW PORTABLE ABDOMEN 1 VIEW COMPARISON:  Portable chest yesterday at 4:45 p.m., CT abdomen and pelvis 04/14/2023 FINDINGS: Chest AP portable at 6:39 a.m.: ETT tip is 3.8 cm from the carina. Right PICC tip remains at the superior cavoatrial junction. There is overlying monitor wiring. There is new demonstration of overlying electrical pads. Stable cardiomediastinal configuration. There is diffuse dense airspace disease throughout the lungs with continued underlying interstitial prominence, minimal pleural effusions. No new or worsening opacity is seen.  There is thoracic spondylosis. AP portable abdomen at 6:43 a.m.: Standard NGT is in place. The tip abuts the proximal body of stomach but the side hole is in the distal thoracic esophagus and should be advanced further in. The bowel pattern  is nonobstructive. Again noted is loss of the haustral fold pattern in the transverse colon compatible with wall edema/colitis. The bowel pattern is nonobstructive. No pneumatosis or supine evidence of free air. There are calcifications of the abdominal aorta and common iliac arteries. No nephrolithiasis. IMPRESSION: 1. ETT tip 3.8 cm from the carina. 2. Standard NGT tip abuts the proximal body of the stomach but the side hole is in the distal thoracic esophagus and should be advanced further in. 3. Stable cardiomegaly and diffuse airspace disease with underlying interstitial prominence. 4. Loss of the haustral fold pattern in the transverse colon compatible with wall edema/colitis. 5. Aortic atherosclerosis. Electronically Signed   By: Almira Bar M.D.   On: 04/15/2023 07:06   DG Abd 1 View  Result Date: 04/15/2023 CLINICAL DATA:  [id number]. Encounter for orogastric tube placement. Check ETT. EXAM: PORTABLE CHEST 1 VIEW PORTABLE ABDOMEN 1 VIEW COMPARISON:  Portable chest yesterday at 4:45 p.m., CT abdomen and pelvis 03/29/2023 FINDINGS: Chest AP portable at 6:39 a.m.: ETT tip is 3.8 cm from the carina. Right PICC tip remains at the superior cavoatrial junction. There is overlying monitor wiring. There is new demonstration of overlying electrical pads. Stable cardiomediastinal configuration. There is diffuse dense airspace disease throughout the lungs with continued underlying interstitial prominence, minimal pleural effusions. No new or worsening opacity is seen.  There is thoracic spondylosis. AP portable abdomen at 6:43 a.m.: Standard NGT is in place. The tip abuts the proximal body of stomach but the side hole is in the distal thoracic esophagus and should be advanced further in. The bowel pattern is nonobstructive. Again noted is loss of the haustral fold pattern in the transverse colon compatible with wall edema/colitis. The bowel pattern is nonobstructive. No pneumatosis or supine evidence of free air.  There are calcifications of the abdominal aorta and common iliac arteries. No nephrolithiasis. IMPRESSION: 1. ETT tip 3.8 cm from the carina. 2. Standard NGT tip abuts the proximal body of the stomach but the side hole is in the distal thoracic esophagus and should be advanced further in. 3. Stable cardiomegaly and diffuse airspace disease with underlying interstitial prominence. 4. Loss of the haustral fold pattern in the transverse colon compatible with wall edema/colitis. 5. Aortic atherosclerosis. Electronically Signed   By: Almira Bar M.D.   On: 04/15/2023 07:06   DG Chest Port 1 View  Result Date: 04/14/2023 CLINICAL DATA:  Respiratory distress EXAM: PORTABLE CHEST 1 VIEW COMPARISON:  04/13/2023 FINDINGS: Diffuse alveolar process identified consistent with extensive pneumonia or pulmonary edema. No pneumothorax identified. No definite pleural effusion is seen. Right-sided PICC tip at the RA SVC junction. Calcified aorta. IMPRESSION: Extensive diffuse alveolar opacity with interval worsening. Electronically Signed   By: Layla Maw M.D.   On: 04/14/2023 16:57     Medications:    LORazepam (ATIVAN) 50 mg in dextrose 5 % 50 mL (1 mg/mL) infusion 0.5 mg/hr (04/15/2023 1150)   morphine 1 mg/hr (03/28/2023 1148)     [DISCONTINUED] ondansetron **OR** ondansetron (ZOFRAN) IV  Assessment/ Plan:  Emma Gould is a 67 y.o.  female  with past medical history consistent with hypertension, nonobstructive CAD, diastolic heart failure, and chronic kidney disease,, who was admitted to [facility] on 04/04/2023 for Colitis [K52.9] Acute colitis [K52.9] AKI (acute kidney injury) [N17.9]   Acute kidney injury on chronic kidney disease stage IIIa. Baseline creatinine appears to be 1.21 with GFR 50 on 02/26/2023. Acute kidney injury secondary to hypovolemia due to persistent diarrhea. No IV contrast exposure. Creatinine 3.17 on ED admission.  2.  Severe hypokalemia/hypomagnesia.  Likely due to persistent  diarrhea.      3.  Hypertension with chronic kidney disease.  On vasopressors   4.  Chronic diarrhea, etiology unknown.  C. difficile negative.  GI panel negative.  GI completed EGD and colonoscopy on 03/29/2023. EGD showed some gastritic while colonoscopy showed multiple polyps, severe colonic inflammation and internal hemorrhoids.   Patient to be moving towards comfort care.   Will sign off.     LOS: 9 Gudrun Axe 4/20/202412:12 PM

## 2023-04-27 NOTE — Death Summary Note (Signed)
   Death Summary   [name] ZOX:[id number] DOB: 08-28-1956 DOA: 04/18/23  PCP: Randel Pigg, Dorma Russell, MD  Admit date: 18-Apr-2023 Date of Death: 04-27-23 Time of death : May 06, 1201 pm  Final Diagnoses:  Principal Problem:   Chronic diarrhea of unknown origin Active Problems:   Depression   Migraine   Hypokalemia   Acute renal failure superimposed on stage 3a chronic kidney disease   Generalized weakness   CAD (coronary artery disease)   Chronic diastolic CHF (congestive heart failure)   Increased anion gap metabolic acidosis   Pancolitis   Protein calorie malnutrition   Frequent falls   AKI (acute kidney injury)   Polyp of ascending colon   Hypomagnesemia   Hypophosphatemia   Acute respiratory failure with hypoxia   Acute metabolic encephalopathy   Hypoalbuminemia   Thrombocytopenia   Cardiac arrest, cause unspecified    History of present illness:  Emma Gould is a 67 y.o. female with medical history significant for hypertension, , CKD llla, nonobstructive CAD, dCHF, anxiety, with a 40-month history of chronic diarrhea, hospitalized early March 2024 at [facility] with critically low potassium of less than 2 and 3/18-3/22 at [facility] for noninfectious colitis complicated by AKI and hypokalemia, discharged on loperamide and Bentyl and referred to GI.  She presented to [facility] ED on April 18, 2023 with a 2-week complaint of abdominal pain, generalized weakness, resulting in frequent falls, most recently on the day of arrival, not resulting in any injury.  She denies fever, chills, dysuria and denies cough, chest pain or shortness of breath.  Patient has persistent nausea and intermittent vomiting to where it is difficult to keep anything on and has associated epigastric discomfort.  Vomiting is nonbloody nonbilious and non-coffee-ground.  His stool is dark, liquid.  She has not seen a gastroenterologist for this problem.   Hospital Course:  Multiorgan failure with VTE susupected massive PE, poor  neurologic examination, cardiac arrest with shock requiring multiple vasopressors.  Family discussion for goals of care - POA decision for comfort measures.     Vida Rigger, M.D.  Pulmonary & Critical Care Medicine  [facility]

## 2023-04-27 NOTE — Progress Notes (Signed)
Spoke with NP, Blood sugar 53, when checked by PICC line. She will enter orders. Continue to assess.

## 2023-04-27 NOTE — Progress Notes (Signed)
Progress Note  Patient Name: Emma Gould Date of Encounter: 03/30/2023  Primary Cardiologist: Bensimhon   Subjective   Remains intubated on vasopressor support with Levophed, vasopressin, and phenylephrine. Not on sedation, unresponsive. Concern for anoxic brain injury. Has been too unstable for CT head or CTA chest/abdomen. Multiorgan failure noted. Mother has indicated the patient would not want HD if needed, and has also noted the patient would not likely want to be maintained artificially on machines. Lower extremity ultrasound yesterday with occlusive thrombus within the paired peroneal veins of the left calf. Labs notable this morning for WBC 22.2, Hgb 10.7, PLT 13 (down from 43), SCr 2.03, albumin < 1.5, AST/ALT 604/124, alk phos 622, INR 4.3.  Inpatient Medications    Scheduled Meds:  vitamin C  500 mg Per Tube BID   Chlorhexidine Gluconate Cloth  6 each Topical Q2200   cholecalciferol  1,000 Units Per Tube Daily   docusate  100 mg Per Tube BID   ezetimibe  10 mg Per Tube Daily   folic acid  1 mg Per Tube Daily   insulin aspart  0-15 Units Subcutaneous Q4H   multivitamin with minerals  1 tablet Per Tube Daily   mouth rinse  15 mL Mouth Rinse Q2H   pantoprazole (PROTONIX) IV  40 mg Intravenous Q24H   polyethylene glycol  17 g Per Tube Daily   sertraline  100 mg Per Tube Daily   sodium chloride flush  10-40 mL Intracatheter Q12H   zinc sulfate  220 mg Per Tube Daily   Continuous Infusions:  sodium chloride Stopped (04/15/23 0620)   heparin 1,200 Units/hr (04/17/2023 0717)   norepinephrine (LEVOPHED) Adult infusion 40 mcg/min (04/08/2023 0858)   phenylephrine (NEO-SYNEPHRINE) Adult infusion 50 mcg/min (03/31/2023 0735)   piperacillin-tazobactam (ZOSYN)  IV 12.5 mL/hr at 04/23/2023 0717   propofol (DIPRIVAN) infusion     sodium bicarbonate 150 mEq in dextrose 5 % 1,150 mL infusion 100 mL/hr at 04/25/2023 0813   vasopressin 0.03 Units/min (04/01/2023 0717)   PRN  Meds: acetaminophen **OR** acetaminophen, fentaNYL (SUBLIMAZE) injection, fentaNYL (SUBLIMAZE) injection, ipratropium-albuterol, midazolam, nitroGLYCERIN, ondansetron **OR** ondansetron (ZOFRAN) IV, mouth rinse, prochlorperazine, sodium chloride, sodium chloride flush   Vital Signs    Vitals:   04/24/2023 0600 04/15/2023 0700 04/02/2023 0719 04/24/2023 0800  BP:      Pulse:    (!) 109  Resp: (!) 29 (!) 30  (!) 29  Temp: 99.1 F (37.3 C) 98.6 F (37 C)  98.8 F (37.1 C)  TempSrc:      SpO2:   100% 100%  Weight:      Height:        Intake/Output Summary (Last 24 hours) at 04/03/2023 0905 Last data filed at 04/03/2023 0717 Gross per 24 hour  Intake 2387.29 ml  Output 125 ml  Net 2262.29 ml   Filed Weights   03/28/2023 0430 04/13/23 0500 04/14/23 0500  Weight: 63.9 kg 71 kg 70 kg    Telemetry    Sinus tachycardia - Personally Reviewed  ECG    No new tracings - Personally Reviewed  Physical Exam   GEN: Acutely ill appearing, not responsive. Not on sedation. Pupils fixed.  Neck: JVD unable to be assessed secondary to respiratory support apparatus. Cardiac: Tachycardic, no murmurs, rubs, or gallops.  Respiratory: Vented breath sounds bilaterally.  GI: Soft, nontender, non-distended.   MS: No edema; No deformity. Neuro:  Intubated, not responsive.  Psych: Intubated, not responsive.  Labs    Chemistry Recent  Labs  Lab 04/14/23 0500 04/14/23 1657 04/15/23 0725 04/26/2023 0310  NA 139 137 145 142  K 3.4* 3.4* 3.5 4.4  CL 113* 113* 113* 110  CO2 17* 14* 17* 10*  GLUCOSE 88 154* 120* 100*  BUN CREATININE 1.39* 1.48* 1.69* 2.03*  CALCIUM 7.9* 7.7* 9.2 8.5*  PROT 4.2*  --  4.0* 3.9*  ALBUMIN <1.5*  --  1.5* <1.5*  AST 48*  --  979* 604*  ALT 27  --  164* 124*  ALKPHOS 564*  --  666* 622*  BILITOT 0.7  --  0.8 1.6*  GFRNONAA 42* 39* 34* 27*  ANIONGAP 22*     Hematology Recent Labs  Lab 04/15/23 0725 04/15/23 1045 03/30/2023 0310  WBC 16.2*  16.5* 22.2*  RBC 3.06* 3.03* 3.14*  HGB 10.3* 10.3* 10.7*  HCT 30.8* 30.6* 32.5*  MCV 100.7* 101.0* 103.5*  MCH 33.7 34.0 34.1*  MCHC 33.4 33.7 32.9  RDW 16.3* 16.3* 17.1*  PLT 52* 43* 13*    Cardiac EnzymesNo results for input(s): "TROPONINI" in the last 168 hours. No results for input(s): "TROPIPOC" in the last 168 hours.   BNP Recent Labs  Lab 04/13/23 1816 04/14/23 1657 04/15/23 0900  BNP 223.4* 208.1* 200.7*     DDimer  Recent Labs  Lab 04/13/23 1842 04/15/23 1300  DDIMER 0.36 >20.00*     Radiology    DG Abd 1 View  Result Date: 04/11/2023 IMPRESSION: Gastric catheter as described. This should be advanced deeper into the stomach. Electronically Signed   By: Alcide Clever M.D.   On: 04/25/2023 08:50   DG Chest Port 1 View  Result Date: 04/24/2023 IMPRESSION: Persistent but improved airspace opacities bilaterally. Electronically Signed   By: Alcide Clever M.D.   On: 04/01/2023 08:48   US Venous Img Lower Bilateral (DVT)  Result Date: 04/15/2023 IMPRESSION: 1. Occlusive thrombus within the paired peroneal veins of the left calf. Thrombus does not extend into the visualized left popliteal vein. 2. These results will be called to the ordering clinician or representative by the Radiologist Assistant, and communication documented in the PACS or [employer]. Electronically Signed   By: Helyn Numbers M.D.   On: 04/15/2023 20:30   DG Chest Port 1 View  Result Date: 04/15/2023 IMPRESSION: 1. ETT tip 3.8 cm from the carina. 2. Standard NGT tip abuts the proximal body of the stomach but the side hole is in the distal thoracic esophagus and should be advanced further in. 3. Stable cardiomegaly and diffuse airspace disease with underlying interstitial prominence. 4. Loss of the haustral fold pattern in the transverse colon compatible with wall edema/colitis. 5. Aortic atherosclerosis. Electronically Signed   By: Almira Bar M.D.   On: 04/15/2023 07:06   DG Abd 1  View  Result Date: 04/15/2023 IMPRESSION: 1. ETT tip 3.8 cm from the carina. 2. Standard NGT tip abuts the proximal body of the stomach but the side hole is in the distal thoracic esophagus and should be advanced further in. 3. Stable cardiomegaly and diffuse airspace disease with underlying interstitial prominence. 4. Loss of the haustral fold pattern in the transverse colon compatible with wall edema/colitis. 5. Aortic atherosclerosis. Electronically Signed   By: Almira Bar M.D.   On: 04/15/2023 07:06   DG Chest Port 1 View  Result Date: 04/14/2023 IMPRESSION: Extensive diffuse alveolar opacity with interval worsening. Electronically Signed   By: Layla Maw M.D.   On:  04/14/2023 16:57    Cardiac Studies   2D echo 04/15/2023: 1. Left ventricular ejection fraction, by estimation, is 50 to 55%. The  left ventricle has low normal function. Challenging images, unable to  exclude regional wall motion abnormality. Left ventricular diastolic  parameters are consistent with Grade I  diastolic dysfunction (impaired relaxation).   2. Right ventricular systolic function is normal. The right ventricular  size is mildly enlarged. There is mildly elevated pulmonary artery  systolic pressure. The estimated right ventricular systolic pressure is  36.9 mmHg.   3. The mitral valve is normal in structure. Mild to moderate mitral valve  regurgitation. No evidence of mitral stenosis.   4. Tricuspid valve regurgitation is moderate.   5. The aortic valve has an indeterminant number of cusps. Aortic valve  regurgitation is mild to moderate. Aortic valve sclerosis is present, with  no evidence of aortic valve stenosis.   6. The inferior vena cava is normal in size with <50% respiratory  variability, suggesting right atrial pressure of 8 mmHg. __________   [facility] 01/26/2023:   Ost Cx to Prox Cx lesion is 30% stenosed.   The left ventricular ejection fraction is 55-65% by visual estimate.    Findings:   Ao = 144/74 (104) LV = 148/18 RA =  14 RV = 38/14 PA = 35/17 (24) PCW = 19 Fick cardiac output/index = 5.0/2.9 PVR = 1.0 FA sat = 95% PA sat = 64%, 66% PAPi = 1.3   Assessment: 1. 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 LVEF 60-65% with mildly elevated LVEDP 3. 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 Normal cardiac output   Plan/Discussion:   Overall R> L heart failure.    Increase torsemide to 20 mg qod alternating with  qod. W/u for Group III PH (COPD, OSA)  __________   2D echo 12/10/2022: 1. Left ventricular ejection fraction, by estimation, is 60 to 65%. The  left ventricle has normal function. The left ventricle has no regional  wall motion abnormalities. Left ventricular diastolic parameters are  indeterminate. The average left  ventricular global longitudinal strain is -15.8 %.   2. Right ventricular systolic function is normal. The right ventricular  size is normal. There is normal pulmonary artery systolic pressure. The  estimated right ventricular systolic pressure is 32.5 mmHg.   3. The mitral valve is normal in structure. Moderate mitral valve  regurgitation. No evidence of mitral stenosis.   4. Tricuspid valve regurgitation is mild to moderate.   5. The aortic valve is tricuspid. Aortic valve regurgitation is mild. No  aortic stenosis is present.   6. The inferior vena cava is normal in size with greater than 50%  respiratory variability, suggesting right atrial pressure of 3 mmHg.   Patient Profile     67 y.o. female with history of minimal nonobstructive CAD by LHC in 12/2022: HFpEF with mild mixed pulmonary hypertension and evidence of RV dysfunction, HTN, fibromyalgia, CKD stage IIIa, 12-month history of chronic diarrhea, tobacco use, and anxiety, and depression who is being seen today for the evaluation of cardiac arrest at the request of Harlon Ditty, NP.    Assessment & Plan    1. Cardiopulmonary arrest with HFpEF and moderate hypertension complicated by multiorgan failure: -Status post 8 to 9 minutes of CPR with ROSC and emergent intubation -Concern for primary respiratory component in the setting of severe hypoxia as patient had removed supplemental oxygen and with potential volume overload  noted on imaging -Concern for possible anoxic brain injury, not responsive, not on sedation -Has been too unstable for head CT -Remains intubated requiring multiple vasopressors -High-sensitivity troponin has trended to greater than 2300, initially not on a heparin drip secondary to INR 2.4, thrombocytopenia, and abnormal LFT during consult -Found to have occlusive left lower extremity DVT, now on heparin gtt with PLT count down to 13 and INR 4.3, would recommend stopping heparin gtt, will defer to CCM -No indication for emergent LHC given cardiac cath less than 3 months ago showed minimal nonobstructive disease involving the LCx system -Suspect elevated troponin is in the setting of supply/demand ischemia secondary to the above -Echo with continued preserved LV systolic function -Supportive care with IV diuresis as tolerated augmented with IV albumin given significant hypoalbuminemia -CTA chest to evaluate for PE pending once hemodynamically stable -Cannot exclude degree of congestive hepatopathy versus shock liver given prior noted RV dysfunction and evidence of volume overload on imaging -Patient's mother reports the patient would not want HD if renal function continues to decline, she also notes she is not sure the patient would want to be maintained artificially on ventilator  -Further evaluation pending meaningful neurologic recovery -Guarded to poor prognosis       For questions or updates, please contact [facility] Please consult [email or web address] for contact info under Cardiology/STEMI.    Signed, Eula Listen, PA-C [facility] Pager:  [phone] Apr 30, 2023, 9:05 AM

## 2023-04-27 NOTE — Progress Notes (Signed)
NAME:  Emma Gould, MRN:  [id number], DOB:  [birth year], LOS: 9 ADMISSION DATE:  03/30/2023, CONSULTATION DATE:  04/15/2023 REFERRING MD:  Manuela Schwartz, NP, CHIEF COMPLAINT:  Chronic Diarrhea   Brief Pt Description / Synopsis:  67 y.o female admitted with Chronic Diarrhea and Pancolitis, Acute Hypoxic Respiratory Failure, HFpEF, and AKI.  Hospital course complicated by in-hospital PEA cardiac arrest (approximately 8-9 minutes of ACLS).  Now with shock and concern for possible anoxic brain injury.  History of Present Illness:  Emma Gould is a 67 y.o. female with medical history significant for hypertension, , CKD llla, nonobstructive CAD, dCHF, anxiety, with a 32-month history of chronic diarrhea, hospitalized early March 2024 at [facility] with critically low potassium of less than 2 and 3/18-3/22 at [facility] for noninfectious colitis complicated by AKI and hypokalemia, discharged on loperamide and Bentyl and referred to GI.  She presented to [facility] ED on 04/03/2023 with a 2-week complaint of abdominal pain, generalized weakness, resulting in frequent falls, most recently on the day of arrival, not resulting in any injury.  She denies fever, chills, dysuria and denies cough, chest pain or shortness of breath.  Patient has persistent nausea and intermittent vomiting to where it is difficult to keep anything on and has associated epigastric discomfort.  Vomiting is nonbloody nonbilious and non-coffee-ground.  His stool is dark, liquid.  She has not seen a gastroenterologist for this problem.  04/18/2023- patient continues to decline with shock physiology now on 3 vasopressors.  Having multiple conversations with family today.  Poor prognosis will discuss comfort measures.    ED Course: Initial Vital Signs: Within normal limits Significant Labs: WBC 16,000, hemoglobin 15, creatinine 3.17, up from baseline of 1.5 with bicarb of 14 and anion gap of 16  Imaging  CT abdomen and pelvis shows findings that represent mild  colitis infectious or inflammatory. Medications Administered: fluid bolus started on sodium bicarb and dextrose as well as ceftriaxone and metronidazole.   Hospitalist were asked to admit for further workup and treatment.  Please see "Significant Hospital Events" section below for full detailed hospital course.   Pertinent  Medical History  CKD stage 3a HFpEF CAD Anxiety & Depression HTN Fibromyalgia  Micro Data:  4/12: C. Diff/GI panel PCR>>negative 4/18: MRSA PCR>>negative 4/19: Tracheal aspirate>>  Antimicrobials:   Anti-infectives (From admission, onward)    Start     Dose/Rate Route Frequency Ordered Stop   04/15/23 1445  piperacillin-tazobactam (ZOSYN) IVPB 3.375 g        3.375 g 12.5 mL/hr over 240 Minutes Intravenous Every 8 hours 04/15/23 1359     04/15/23 0952  vancomycin variable dose per unstable renal function (pharmacist dosing)  Status:  Discontinued         Does not apply See admin instructions 04/15/23 0952 04/15/23 1012   04/14/23 2100  vancomycin (VANCOREADY) IVPB 1500 mg/300 mL  Status:  Discontinued        1,500 mg 150 mL/hr over 120 Minutes Intravenous Every 48 hours 04/14/23 1845 04/15/23 0952   04/14/23 2000  ceFEPIme (MAXIPIME) 2 g in sodium chloride 0.9 % 100 mL IVPB  Status:  Discontinued        2 g 200 mL/hr over 30 Minutes Intravenous Every 12 hours 04/14/23 1843 04/15/23 1013   04/08/23 2000  cefTRIAXone (ROCEPHIN) 2 g in sodium chloride 0.9 % 100 mL IVPB  Status:  Discontinued        2 g 200 mL/hr over 30 Minutes Intravenous Every 24 hours  04/08/23 1200 04/05/2023 1133   April 15, 2023 1915  cefTRIAXone (ROCEPHIN) 2 g in sodium chloride 0.9 % 100 mL IVPB        2 g 200 mL/hr over 30 Minutes Intravenous  Once 04/15/2023 1913 Apr 15, 2023 2029   04-15-2023 1915  metroNIDAZOLE (FLAGYL) IVPB 500 mg  Status:  Discontinued        500 mg 100 mL/hr over 60 Minutes Intravenous Every 8 hours 04-15-2023 1913 04/15/2023 1133        Significant Hospital  Events: Including procedures, antibiotic start and stop dates in addition to other pertinent events   2023-04-15: Admitted by Hospitalist.  GI consulted 4/12: GI recommends empiric trial of pancreatic enzymes, along with plan for  EGD and colonoscopy 4/13: EGD/colonoscopy cancelled due to lack of bowel prep as pt unable to tolerate PO. 4/14: Nephrology consulted for AKI and multiple metabolic derangements. PICC placement for aggressive repletion of electrolytes 4/15: AKI and electrolytes improved with repletion 4/16:  GI performed EGD and Colonoscopy: EGD: Gastritis, stomach and duodenum biopsied.  Colonoscopy: diffuse severe inflammation in colon due to pancolitis. Multiple polyps vs pseudopolyps, nonbleeding internal hemorrhoids. 4/17:  Continued poor PO intake, now with constipation.  Renal function improving 4/18: Again with diarrhea.  Later in the day with worsening hypoxia and respiratory distress, poor response to IV Lasix (20 mg x2).  Empirically started on Cefepime and Vancomycin worsening of diffuse opacities on CXR. Transferred to Stepdown. 4/19: Suffered in-hospital PEA cardiac arrest of 8-9 minutes in duration. Suspect respiratory in etiology (severe hypoxia as pt removed supplemental O2).  Unresponsive, concern for possible anoxic brain injury.  Critically ill with multiorgan failure. Re-consult Cardiology.  Interim History / Subjective:  -This morning with brief inpatient cardiac arrest, suspect respiratory etiology as patient severely hypoxic following removal of oxygen with progressive bradycardia episode leading to PEA arrest -NO STEMI on EKG -Currently on the vent, overbreathing the vent, positive brainstem reflexes, but has NOT received sedation and remains unresponsive, pupils fixed and dilated at 5 mm  bilaterally ~concern for anoxic brain injury -Plan to obtain CT head, CTA chest and abdomen ~currently hemodynamically unstable, will obtain when able   Objective   Blood pressure  93/70, pulse (!) 109, temperature 98.8 F (37.1 C), resp. rate (!) 29, height 5\' 3"  (1.6 m), weight 70 kg, SpO2 100 %. CVP:  [4 mmHg] 4 mmHg  Vent Mode: PRVC FiO2 (%):  [60 %-100 %] 60 % Set Rate:  [16 bmp] 16 bmp Vt Set:  [450 mL] 450 mL PEEP:  [8 cmH20-10 cmH20] 10 cmH20 Plateau Pressure:  [19 cmH20-31 cmH20] 31 cmH20   Intake/Output Summary (Last 24 hours) at 04/01/2023 0851 Last data filed at 03/29/2023 1610 Gross per 24 hour  Intake 2387.29 ml  Output 125 ml  Net 2262.29 ml    Filed Weights   03/31/2023 0430 04/13/23 0500 04/14/23 0500  Weight: 63.9 kg 71 kg 70 kg    Examination: General: Critically ill-appearing female, on the ventilator, unresponsive, no acute distress HENT: Atraumatic, normocephalic, neck supple, no JVD Lungs: Coarse breath sounds bilaterally, even, overbreathing the vent Cardiovascular: Tachycardia, regular rhythm, S1-S2, no murmurs, rubs, gallops Abdomen: Soft, nontender, nondistended, no guarding or rebound tenderness, bowel sounds positive x 4 Extremities: Warm/dry, pulses positive to R/P, 2+ edema noted to bilateral lower extremities Neuro: Unresponsive (has not received sedation), pupils fixed and dilated at 5 mm bilaterally, does have positive brainstem reflexes (cough/gag/corneal reflexes) GU: Foley catheter in place draining scant amount of yellow urine  Resolved Hospital Problem list     Assessment & Plan:   #Cardiac arrest: initial rhythm PEA s/t suspected respiratory arrest/severe hypoxia #Undifferentiated Shock: Hypovolemic vs Septic vs Cardiogenic #Acute on Chronic HFpEF #Elevated Troponin in setting of Demand ischemia vs NSTEMI PMHx: HTN, HFpEF, non obstructive CAD 9 minutes downtime, CPR initiated immediately, received 1 defibrillations, suspected suspected anoxic injury -Continuous cardiac monitoring -Maintain MAP >65 -Vasopressors as needed to maintain MAP goal -Check CVP and Coox panel -Trend lactic acid until normalized (7.7 ~  6.0 ~ ) -Trend HS Troponin until peaked (576 ~ 1276 ~ 2315 ~) -Echocardiogram pending -STAT CTa chest to rule out PE ~ will obtain once able, currently hemodynamically unstable -Consider Normothermia Protocol if temp > 37.6, depending on imaging results -Reluctant to start Empiric systemic heparin given thrombocytopenia, INR 2.4, and concern for shock liver -Consult Cardiology, appreciate input  #Acute hypoxic respiratory failure multifocal in the setting of HFpEF exacerbation & cardiac arrest, ? Developing HAP -Full vent support, implement lung protective strategies -Plateau pressures less than 30 cm H20 -Wean FiO2 & PEEP as tolerated to maintain O2 sats >92% -Follow intermittent Chest X-ray & ABG as needed -Spontaneous Breathing Trials when respiratory parameters met and mental status permits -Implement VAP Bundle -Prn Bronchodilators -Diuresis as BP and renal function permits ~ currently unable to diurese due to hypotension requiring vasopressors -Continue cefepime & vancomycin -CTA Chest pending to rule out pE  #Meets SIRS Criteria (RR >20, HR >90, WBC 16) #Severe Sepsis in setting of Pancolitis, ? Developing HAP -Monitor fever curve -Trend WBC's & Procalcitonin -Follow cultures as above -Continue empiric Zosyn pending cultures & sensitivities -CTA Chest/Abdomen/Pelvis pending  #Tranaminitis, ? Developing shock liver #Pancolitis #Chronic Diarrhea of unknown origin  Patient presents with nausea vomiting and diarrhea, similar to hospitalization at [facility] from 3/18 to 3/22 GI infectious workup was negative with negative GI panel negative ova and parasites and negative C. difficile CT abdomen and pelvis showed colitis back in March and now shows mild colitis Underwent EGD/colonoscopy 2023-04-14. EGD showed gastritis and colonoscopy showed pancolitis biopsies were taken.  Also showed polyp that was removed and nonbleeding internal hemorrhoids. -Trend LFT's and coags -CT Abdomen &  Pelvis is pending -ABX as above  #Acute Kidney Injury superimposed on CKD Stage IIIa #Anion Gap Metabolic Acidosis #Lactic Acidosis -Monitor I&O's / urinary output -Follow BMP -Ensure adequate renal perfusion -Avoid nephrotoxic agents as able -Replace electrolytes as indicated, pharmacy following for assistance with electrolyte replacement -Start Bicarb gtt -Nephrology following, appreciate input  #Thrombocytopenia #Supra therapeutic INR #Anemia without overt blood loss -Monitor for S/Sx of bleeding -Trend CBC -SCD's for VTE Prophylaxis (avoid chemical ppx for now) -Transfuse for Hgb <7 -Check peripheral smear and DIC panel ~ normal platelet morphology on smear  #Hyperglycemia -CBG's q4h; Target range of 140 to 180 -SSI -Follow ICU Hypo/Hyperglycemia protocol  #Unresponsive post Cardiac arrest #At risk for anoxic encephalopathy. -Maintain a RASS goal of 0 to -1 -Propofol is needed to maintain RASS goal -Avoid sedating medications as able -Daily wake up assessment -CT Head pending -Obtain EEG -Low threshold for Neurology consultation depending on imaging results and clinical exam    Pt is critically ill s/p cardiac arrest, unresponsive with concern for possible anoxic brain injury, and multiple organ failure.  High risk for subsequent cardiac arrest and death.  Prognosis is extremely guarded.  Recommend DNR status.  Likely will need to involve Palliative Care depending on clinical course.  Best Practice (right click and "Reselect all SmartList  Selections" daily)   Diet/type: NPO DVT prophylaxis: SCD GI prophylaxis: H2B Lines: Central line, Arterial Line, and yes and it is still needed Foley:  Yes, and it is still needed Code Status:  full code Last date of multidisciplinary goals of care discussion [04/15/23]  4/19: Pt's mother and step-father updated at bedside.  Labs   CBC: Recent Labs  Lab 04/10/23 0447 04/11/23 0525 04/13/2023 0358 04/13/23 0529  04/14/23 0500 04/15/23 0725 04/15/23 1045 May 13, 2023 0310  WBC 12.0* 14.0* 14.5* 18.2* 16.6* 16.2* 16.5* 22.2*  NEUTROABS 6.3 9.0* 10.1* 13.8*  --   --  12.8*  --   HGB 12.2 11.5* 10.8* 11.6* 10.8* 10.3* 10.3* 10.7*  HCT 36.4 34.2* 31.8* 33.0* 31.0* 30.8* 30.6* 32.5*  MCV 99.5 99.4 99.7 97.6 96.9 100.7* 101.0* 103.5*  PLT 114* 116* 94* 103* 96* 52* 43* 13*     Basic Metabolic Panel: Recent Labs  Lab 04/10/23 0447 04/11/23 0055 04/11/23 0525 04/11/23 0830 04/01/2023 0847 04/13/23 0529 04/14/23 0500 04/14/23 1657 04/15/23 0725 May 13, 2023 0310  NA 137  --  136   < >  --  136 139 137 145 142  K 2.5*   < > 3.7   < >  --  3.3* 3.4* 3.4* 3.5 4.4  CL 103  --  107   < >  --  113* 113* 113* 113* 110  CO2 23  --  21*   < >  --  15* 17* 14* 17* 10*  GLUCOSE 67*  --  77   < >  --  76 88 154* 120* 100*  BUN 28*  --  19   < >  --  13 15 14 16 19   CREATININE 2.49*  --  1.73*   < >  --  1.27* 1.39* 1.48* 1.69* 2.03*  CALCIUM 6.9*  --  7.0*   < >  --  7.4* 7.9* 7.7* 9.2 8.5*  MG 0.9*  --  2.9*  --  1.8 1.9 1.8  --  1.8 2.7*  PHOS 3.4  --  2.3*  --   --   --  2.8  --  6.0* 6.5*   < > = values in this interval not displayed.    GFR: Estimated Creatinine Clearance: 26.3 mL/min (A) (by C-G formula based on SCr of 2.03 mg/dL (H)). Recent Labs  Lab 04/14/23 0500 04/14/23 1657 04/14/23 1657 04/14/23 1852 04/15/23 0500 04/15/23 0725 04/15/23 0900 04/15/23 1045 04/15/23 1335 May 13, 2023 0310  PROCALCITON 1.68 1.72  --   --  1.80  --   --   --   --   --   WBC 16.6*  --   --   --   --  16.2*  --  16.5*  --  22.2*  LATICACIDVEN  --  3.1*   < > 1.8  --  7.7* 6.0*  --  4.6*  --    < > = values in this interval not displayed.     Liver Function Tests: Recent Labs  Lab 04/14/23 0500 04/15/23 0725 05/13/2023 0310  AST 48* 979* 604*  ALT 27 164* 124*  ALKPHOS 564* 666* 622*  BILITOT 0.7 0.8 1.6*  PROT 4.2* 4.0* 3.9*  ALBUMIN <1.5* 1.5* <1.5*    Recent Labs  Lab 04/15/23 0900  LIPASE 14    Recent Labs  Lab 04/14/23 1333  AMMONIA 55*     ABG    Component Value Date/Time   PHART 7.49 (H) 04/15/2023 1600  PCO2ART 25 (L) 04/15/2023 1600   PO2ART 159 (H) 04/15/2023 1600   HCO3 19.1 (L) 04/15/2023 1600   TCO2 23 01/26/2023 1228   ACIDBASEDEF 3.3 (H) 04/15/2023 1600   O2SAT 98.9 04/15/2023 1600     Coagulation Profile: Recent Labs  Lab 04/15/23 0725 05-08-2023 0310  INR 2.4* 4.3*     Cardiac Enzymes: No results for input(s): "CKTOTAL", "CKMB", "CKMBINDEX", "TROPONINI" in the last 168 hours.  HbA1C: Hgb A1c MFr Bld  Date/Time Value Ref Range Status  04/02/2023 02:15 PM 5.1 4.8 - 5.6 % Final    Comment:    (NOTE) Pre diabetes:          5.7%-6.4%  Diabetes:              >6.4%  Glycemic control for   <7.0% adults with diabetes     CBG: Recent Labs  Lab 04/15/23 2332 2023/05/08 0352 05/08/23 0716 05-08-23 0720 05/08/23 0724  GLUCAP 121* 84 <10* 13* 53*     Review of Systems:   Unable to assess due to unresponsiveness, intubation, and critical illness   Past Medical History:  She,  has a past medical history of Anxiety, CHF (congestive heart failure), CKD (chronic kidney disease), Depression, Fibromyalgia, and Hypertension.   Surgical History:   Past Surgical History:  Procedure Laterality Date   CERVICAL FUSION     COLONOSCOPY N/A 04/24/2023   Procedure: COLONOSCOPY;  Surgeon: Midge Minium, MD;  Location: [facility] ENDOSCOPY;  Service: Endoscopy;  Laterality: N/A;   ESOPHAGOGASTRODUODENOSCOPY (EGD) WITH PROPOFOL N/A 04/13/2023   Procedure: ESOPHAGOGASTRODUODENOSCOPY (EGD) WITH PROPOFOL;  Surgeon: Midge Minium, MD;  Location: [facility] ENDOSCOPY;  Service: Endoscopy;  Laterality: N/A;   RIGHT/LEFT HEART CATH AND CORONARY ANGIOGRAPHY Bilateral 01/26/2023   Procedure: RIGHT/LEFT HEART CATH AND CORONARY ANGIOGRAPHY;  Surgeon: Dolores Patty, MD;  Location: [facility] INVASIVE CV LAB;  Service: Cardiovascular;  Laterality: Bilateral;     Social History:    reports that she has been smoking cigarettes. She has a 22.50 pack-year smoking history. She has never used smokeless tobacco. She reports that she does not drink alcohol and does not use drugs.   Family History:  Her family history includes Hypertension in her mother; Lung cancer in her father; Stroke in her mother.   Allergies No Known Allergies   Home Medications  Prior to Admission medications   Medication Sig Start Date End Date Taking? Authorizing Provider  acetaminophen (TYLENOL) 325 MG tablet Take 2 tablets (650 mg total) by mouth every 6 (six) hours as needed for mild pain or headache (fever >/= 101). 09/05/19  Yes Lonia Blood, MD  carvedilol (COREG) 6.25 MG tablet Take 1 tablet (6.25 mg total) by mouth 2 (two) times daily with a meal. 02/24/23  Yes Sabharwal, Aditya, DO  empagliflozin (JARDIANCE) 10 MG TABS tablet Take 1 tablet (10 mg total) by mouth daily before breakfast. 01/25/23  Yes Bensimhon, Bevelyn Buckles, MD  Eszopiclone 3 MG TABS Take 3 mg by mouth at bedtime. 10/07/22  Yes [provider]  ezetimibe (ZETIA) 10 MG tablet Take 10 mg by mouth daily.   Yes [provider]  omeprazole (PRILOSEC) 40 MG capsule Take 40 mg by mouth daily. 06/11/19  Yes [provider]  ondansetron (ZOFRAN) 4 MG tablet Take 4 mg by mouth every 8 (eight) hours as needed for nausea. 03/18/23 04/17/23 Yes [provider]  oxyCODONE-acetaminophen (PERCOCET) 10-325 MG tablet Take 1 tablet by mouth 4 (four) times daily as needed for pain.  10/03/22  Yes [provider]  potassium chloride SA (KLOR-CON M) 20 MEQ tablet Take 2 tablets (40 mEq total) by mouth daily. 02/26/23  Yes Enedina Finner, MD  QUEtiapine (SEROQUEL) 25 MG tablet Take 4 tablets (100 mg total) by mouth at bedtime. Patient taking differently: Take 50 mg by mouth 2 (two) times daily. 02/26/23  Yes Enedina Finner, MD  sertraline (ZOLOFT) 100 MG tablet Take 100 mg by mouth daily.   Yes [provider]   spironolactone (ALDACTONE) 25 MG tablet Take 12.5 mg by mouth daily.   Yes [provider]  SUMAtriptan (IMITREX) 50 MG tablet Take 50 mg by mouth as needed for migraine. Take a second tablet 2 hours later if needed; max /day 12/21/18  Yes [provider]  topiramate (TOPAMAX) 100 MG tablet Take 100 mg by mouth 2 (two) times daily.   Yes [provider]  torsemide (DEMADEX) 20 MG tablet TAKE 1 TABLET BY MOUTH EVERY DAY 03/11/23  Yes Sabharwal, Aditya, DO  albuterol (VENTOLIN HFA) 108 (90 Base) MCG/ACT inhaler Inhale 2 puffs into the lungs 4 (four) times daily as needed for shortness of breath. Patient not taking: Reported on 04/21/2023    [provider]     Critical care provider statement:   Total critical care time: 33 minutes   Performed by: Karna Christmas MD   Critical care time was exclusive of separately billable procedures and treating other patients.   Critical care was necessary to treat or prevent imminent or life-threatening deterioration.   Critical care was time spent personally by me on the following activities: development of treatment plan with patient and/or surrogate as well as nursing, discussions with consultants, evaluation of patient's response to treatment, examination of patient, obtaining history from patient or surrogate, ordering and performing treatments and interventions, ordering and review of laboratory studies, ordering and review of radiographic studies, pulse oximetry and re-evaluation of patient's condition.    Vida Rigger, M.D.  Pulmonary & Critical Care Medicine

## 2023-04-27 NOTE — Progress Notes (Signed)
Notified NP  Ouma of blood pressure MAP of 55 despite being maxed out on Vazopressin and Levophed. No new orders received. Will continue to monitor

## 2023-04-27 NOTE — IPAL (Signed)
GOALS OF CARE FAMILY CONFERENCE   Current clinical status, hospital findings and medical plan was reviewed with family.   Updated and notified of patients ongoing immediate critical medical problems.   Patient remains unresponsive acutely comatose    Patient is unable to breathe independently, unable to protect airway and unable to mobilize secretions.    Explained to family course of therapy and the modalities   Patient with Progressive multiorgan failure with high probability of a very minimal chance of meaningful recovery despite aggressive and optimal medical therapy.   Family is appreciative of care and relate understanding that patient is severely critically ill with anticipation of passing away during this hospitalization.   They have consented and agreed to comfort care Code status   Family are satisfied with Plan of action and management. All questions answered  Additional Critical Care time 35 mins    Shastina Rua, M.D.  Pulmonary & Critical Care Medicine  [facility]   

## 2023-04-27 NOTE — Procedures (Signed)
History: 67 yo F s/p cardiac arrest  Sedation: none  Technique: This EEG was acquired with electrodes placed according to the International 10-20 electrode system (including Fp1, Fp2, F3, F4, C3, C4, P3, P4, O1, O2, T3, T4, T5, T6, A1, A2, Fz, Cz, Pz). The following electrodes were missing or displaced: none.   Background: This EEG is markedly abnormal with a lack of normal background rhythms.  The background consists at times of generalized attenuation, and at other times generalized irregular slow activity.  Superimposed on this there are periodic generalized discharges with smoothly contoured delta wave morphology with no evidence of evolution or other concerning features.  Photic stimulation: Physiologic driving is now performed  EEG Abnormalities: 1) generalized periodic discharges with delta morphology 2) generalized irregular slow activity 3) absent posterior dominant rhythm   Clinical Interpretation: This EEG is consistent with a severe generalized cerebral dysfunction (encephalopathy).  In the setting of cardiac arrest, the generalized.  I discharges do raise concern for poor prognosis, but without clear burst suppression, epileptiform morphology, I am not sure that this is definitive for poor prognosis, but is highly concerning for significant cerebral injury.   Emma Slot, MD [facility] [phone]  If 7pm- 7am, please page neurology on call as listed in AMION.

## 2023-04-27 NOTE — Progress Notes (Signed)
Patient transitioned to comfort care. Family is at bedside. Continue to monitor.

## 2023-04-27 NOTE — Consult Note (Addendum)
Neurology Consultation Reason for Consult: Concern for anoxic brain injury Referring Physician: Karna Christmas, F  CC: Unresponsiveness  History is obtained from: Chart review  HPI: Krupa Stege is a 67 y.o. female who was admitted with GI complaints and AKI underwent cardiac arrest in the setting of taking off her oxygen yesterday, with concern for pulmonary embolus.  She has been unresponsive since her arrest and neurology's been consulted for concerns for anoxic brain injury.   Past Medical History:  Diagnosis Date   Anxiety    CHF (congestive heart failure)    CKD (chronic kidney disease)    Depression    Fibromyalgia    Hypertension      Family History  Problem Relation Age of Onset   Stroke Mother    Hypertension Mother    Lung cancer Father        Father died of lung cancer     Social History:  reports that she has been smoking cigarettes. She has a 22.50 pack-year smoking history. She has never used smokeless tobacco. She reports that she does not drink alcohol and does not use drugs.  Exam: Current vital signs: BP 93/70   Pulse (!) 109   Temp 98.8 F (37.1 C)   Resp (!) 29   Ht  (1.6 m)   Wt 74.7 kg   SpO2 100%   BMI 29.17 kg/m  Vital signs in last 24 hours: Temp:  [96.6 F (35.9 C)-99.7 F (37.6 C)] 98.8 F (37.1 C) (04/20 0800) Pulse Rate:  [31-129] 109 (04/20 0800) Resp:  [29-41] 29 (04/20 0800) SpO2:  [66 %-100 %] 100 % (04/20 0800) Arterial Line BP: (93-134)/(32-79) 125/43 (04/20 0800) FiO2 (%):  [60 %-100 %] 60 % (04/20 0719) Weight:  [74.7 kg] 74.7 kg (04/20 0714)   Physical Exam  Appears chronically ill  Mental Status: Patient does not respond to verbal stimuli.  Does not respond to deep sternal rub.  Does not follow commands.  No verbalizations are noted.   Cranial Nerves: II: patient does not respond confrontation bilaterally, pupils right 7 mm, left 7 mm,and irregular and fixed bilaterally III,IV,VI: She has roving eye movements  crossing midline in both directions V,VII: corneal reflex absent on the left, present on the right VIII: patient does not respond to verbal stimuli IX,X: Cough reflex absent,  XI: unable to test bilaterally due to coma XII: unable to test due to coma  Motor: Extremities flaccid throughout.  No spontaneous movement noted.  No purposeful movements noted.  Sensory: Does not respond to noxious stimuli in any extremity.  Cerebellar: Unable to perform due to coma  Gait: Unable to perform due to coma  I have reviewed labs in epic and the results pertinent to this consultation are: BUN 19 Elevated LFTs  Impression: 67 year old female with encephalopathy and abnormal brain stem reflexes after cardiac arrest.  Pupils are now blown and fixed with asymmetric corneal reflex and roving eye movements.  These findings are highly concerning that she suffered a fairly severe anoxic brain injury during her arrest yesterday.  She is unfortunately too unstable to travel at the current time, but head CT could be helpful if possible.  Blown pupils with roving eye movements is an unusual combination, but her pupils are documented as equal round and reactive at her recent PCP visit in March.  Given her refractory shock and multiple comorbidities, as well as high concern for likely anoxic brain injury, it would not be unreasonable to approach family about  limitations of care.  Recommendations: 1) CT head when possible 2) ammonia level 3) neurology will follow  This patient is critically ill and at significant risk of neurological worsening, death and care requires constant monitoring of vital signs, hemodynamics,respiratory and cardiac monitoring, neurological assessment, discussion with family, other specialists and medical decision making of high complexity. I spent 34 minutes of neurocritical care time  in the care of  this patient. This was time spent independent of any time provided by nurse practitioner  or PA.  Ritta Slot, MD [facility] [phone]  If 7pm- 7am, please page neurology on call as listed in [facility]. 03/29/2023  10:02 AM

## 2023-04-27 NOTE — Progress Notes (Signed)
Patient passed at 12:02. Pronounced by Butler Denmark RN. No palpable pulse, no heart or lung sounds auscultated. EKG asystole. Family at bedside. E-link called [employer] with time of death. I notified Dr. Mervyn Skeeters and supervisor. Family at patients bedside.

## 2023-04-27 NOTE — Progress Notes (Signed)
Patients [name]. Spoke with NP, she will be adding another vasopressor. Continue to assess. Requested medication from pharmacy.

## 2023-04-27 NOTE — Consult Note (Signed)
ANTICOAGULATION CONSULT NOTE - Initial Consult  Pharmacy Consult for heparin infusion Indication: pulmonary embolus  No Known Allergies  Patient Measurements: Height:  (160 cm) Weight: 70 kg (154 lb 5.2 oz) IBW/kg (Calculated) : 52.4 Heparin Dosing Weight: 70 kg  Vital Signs: Temp: 99.7 F (37.6 C) (04/20 0400) Temp Source: Esophageal (04/19 1900) Pulse Rate: 121 (04/20 0400)  Labs: Recent Labs    04/14/23 1657 04/15/23 0725 04/15/23 0900 04/15/23 1045 04/15/23 1124 03/29/2023 0310  HGB  --  10.3*  --  10.3*  --  10.7*  HCT  --  30.8*  --  30.6*  --  32.5*  PLT  --  52*  --  43*  --  13*  LABPROT  --  25.7*  --   --   --   --   INR  --  2.4*  --   --   --   --   CREATININE 1.48* 1.69*  --   --   --  2.03*  TROPONINIHS 85* 576* 1,276*  --  2,315*  --      Estimated Creatinine Clearance: 26.3 mL/min (A) (by C-G formula based on SCr of 2.03 mg/dL (H)).   Medical History: Past Medical History:  Diagnosis Date   Anxiety    CHF (congestive heart failure)    CKD (chronic kidney disease)    Depression    Fibromyalgia    Hypertension     Medications:  PTA: N/A Inpatient: heparin infusion (4/19 >>>) Allergies: NKDA  Assessment: 67 year old female with history of CHF, mixed pulmonary hypertension, and nonobstructive CAD admitted with worsening hypoxia. US Doppler study confirmed occlusive thrombus within the paired left peroneal veins of the left calf. Spoke with ICU evening coverage who reports findings suspicious of pulmonary embolism. Pharmacy consulted for heparin infusion in the setting of PE.  Date Time aPTT/HL Rate/Comment 0420 0310 HL 0.57 Therapeutic x 1 (Verbal from Lab)  Goal of Therapy:  Heparin level 0.3-0.7 units/ml Monitor platelets by anticoagulation protocol: Yes  Plan:  Continue heparin infusion at 1200 units/hr Recheck HL in 6 hrs to confirm Continue to monitor H&H and platelets daily while on heparin gtt.  Otelia Sergeant, PharmD,  [facility] 04/01/2023 5:49 AM

## 2023-04-27 DEATH — deceased

## 2023-05-18 ENCOUNTER — Encounter: Payer: Medicare Other | Admitting: Cardiology

## 2023-05-26 ENCOUNTER — Other Ambulatory Visit: Payer: Self-pay | Admitting: Cardiology

## 2023-06-07 ENCOUNTER — Other Ambulatory Visit: Payer: Self-pay | Admitting: Family
